# Patient Record
Sex: Female | Born: 1935 | Race: White | Hispanic: No | Marital: Married | Smoking: Never smoker
Health system: Southern US, Community
[De-identification: ages and names within clinical notes are randomized; demographics above are authoritative.]

## PROBLEM LIST (undated history)

## (undated) DIAGNOSIS — E119 Type 2 diabetes mellitus without complications: Secondary | ICD-10-CM

## (undated) DIAGNOSIS — I499 Cardiac arrhythmia, unspecified: Secondary | ICD-10-CM

## (undated) DIAGNOSIS — I700 Atherosclerosis of aorta: Secondary | ICD-10-CM

## (undated) DIAGNOSIS — E785 Hyperlipidemia, unspecified: Secondary | ICD-10-CM

## (undated) DIAGNOSIS — C801 Malignant (primary) neoplasm, unspecified: Secondary | ICD-10-CM

## (undated) DIAGNOSIS — M1990 Unspecified osteoarthritis, unspecified site: Secondary | ICD-10-CM

## (undated) DIAGNOSIS — C50919 Malignant neoplasm of unspecified site of unspecified female breast: Secondary | ICD-10-CM

## (undated) DIAGNOSIS — G629 Polyneuropathy, unspecified: Secondary | ICD-10-CM

## (undated) DIAGNOSIS — E039 Hypothyroidism, unspecified: Secondary | ICD-10-CM

## (undated) HISTORY — PX: BREAST SURGERY: SHX581

## (undated) HISTORY — PX: ABLATION: SHX5711

---

## 2006-12-16 DIAGNOSIS — C50919 Malignant neoplasm of unspecified site of unspecified female breast: Secondary | ICD-10-CM

## 2006-12-16 HISTORY — DX: Malignant neoplasm of unspecified site of unspecified female breast: C50.919

## 2006-12-16 HISTORY — PX: MASTECTOMY: SHX3

## 2013-01-14 ENCOUNTER — Ambulatory Visit: Payer: Self-pay | Admitting: Internal Medicine

## 2013-05-01 ENCOUNTER — Ambulatory Visit: Payer: Self-pay | Admitting: Orthopedic Surgery

## 2013-05-01 ENCOUNTER — Emergency Department: Payer: Self-pay | Admitting: Emergency Medicine

## 2013-05-01 LAB — PROTIME-INR
INR: 2.9
Prothrombin Time: 29.4 secs — ABNORMAL HIGH (ref 11.5–14.7)

## 2013-05-01 LAB — CBC
HCT: 38 % (ref 35.0–47.0)
HGB: 13 g/dL (ref 12.0–16.0)
MCH: 30.4 pg (ref 26.0–34.0)
MCHC: 34.1 g/dL (ref 32.0–36.0)
RBC: 4.26 10*6/uL (ref 3.80–5.20)
WBC: 5.5 10*3/uL (ref 3.6–11.0)

## 2013-05-01 LAB — BASIC METABOLIC PANEL
BUN: 17 mg/dL (ref 7–18)
Calcium, Total: 9.2 mg/dL (ref 8.5–10.1)
Chloride: 106 mmol/L (ref 98–107)
Co2: 27 mmol/L (ref 21–32)
Creatinine: 1.05 mg/dL (ref 0.60–1.30)
EGFR (African American): >60
EGFR (Non-African Amer.): 52 — ABNORMAL LOW
Glucose: 105 mg/dL — ABNORMAL HIGH (ref 65–99)
Osmolality: 281 (ref 275–301)
Sodium: 140 mmol/L (ref 136–145)

## 2013-05-01 LAB — APTT: Activated PTT: 36.3 secs — ABNORMAL HIGH (ref 23.6–35.9)

## 2013-05-03 ENCOUNTER — Ambulatory Visit: Payer: Self-pay | Admitting: Orthopedic Surgery

## 2013-05-05 ENCOUNTER — Ambulatory Visit: Payer: Self-pay | Admitting: Orthopedic Surgery

## 2013-05-05 LAB — APTT: Activated PTT: 26.9 secs (ref 23.6–35.9)

## 2013-10-18 ENCOUNTER — Ambulatory Visit: Payer: Self-pay | Admitting: Gastroenterology

## 2013-10-19 LAB — PATHOLOGY REPORT

## 2013-12-30 ENCOUNTER — Ambulatory Visit: Payer: Self-pay | Admitting: Orthopedic Surgery

## 2014-01-04 ENCOUNTER — Ambulatory Visit: Payer: Self-pay | Admitting: Gastroenterology

## 2014-01-06 LAB — PATHOLOGY REPORT

## 2014-01-11 ENCOUNTER — Ambulatory Visit: Payer: Self-pay | Admitting: Orthopedic Surgery

## 2014-01-19 ENCOUNTER — Ambulatory Visit: Payer: Self-pay | Admitting: Internal Medicine

## 2014-08-15 ENCOUNTER — Emergency Department: Payer: Self-pay | Admitting: Emergency Medicine

## 2014-08-16 LAB — CBC
HCT: 40.3 % (ref 35.0–47.0)
HGB: 13.5 g/dL (ref 12.0–16.0)
MCH: 30.3 pg (ref 26.0–34.0)
MCHC: 33.5 g/dL (ref 32.0–36.0)
MCV: 90 fL (ref 80–100)
Platelet: 133 10*3/uL — ABNORMAL LOW (ref 150–440)
RBC: 4.45 10*6/uL (ref 3.80–5.20)
RDW: 13.7 % (ref 11.5–14.5)
WBC: 4.5 10*3/uL (ref 3.6–11.0)

## 2014-08-16 LAB — URINALYSIS, COMPLETE
Bilirubin,UR: NEGATIVE
Blood: NEGATIVE
Glucose,UR: >=500 mg/dL (ref 0–75)
Ketone: NEGATIVE
LEUKOCYTE ESTERASE: NEGATIVE
Nitrite: NEGATIVE
PH: 7 (ref 4.5–8.0)
Protein: NEGATIVE
RBC,UR: 1 /HPF (ref 0–5)
SQUAMOUS EPITHELIAL: NONE SEEN
Specific Gravity: 1.002 (ref 1.003–1.030)

## 2014-08-16 LAB — COMPREHENSIVE METABOLIC PANEL
ALBUMIN: 3.4 g/dL (ref 3.4–5.0)
Alkaline Phosphatase: 61 U/L
Anion Gap: 7 (ref 7–16)
BUN: 18 mg/dL (ref 7–18)
Bilirubin,Total: 0.3 mg/dL (ref 0.2–1.0)
CHLORIDE: 107 mmol/L (ref 98–107)
CO2: 27 mmol/L (ref 21–32)
Calcium, Total: 8.8 mg/dL (ref 8.5–10.1)
Creatinine: 1.04 mg/dL (ref 0.60–1.30)
EGFR (Non-African Amer.): 52 — ABNORMAL LOW
Glucose: 255 mg/dL — ABNORMAL HIGH (ref 65–99)
Osmolality: 292 (ref 275–301)
Potassium: 3.6 mmol/L (ref 3.5–5.1)
SGOT(AST): 34 U/L (ref 15–37)
SGPT (ALT): 29 U/L
SODIUM: 141 mmol/L (ref 136–145)
TOTAL PROTEIN: 6.6 g/dL (ref 6.4–8.2)

## 2014-08-16 LAB — PROTIME-INR
INR: 2.3
Prothrombin Time: 24.5 secs — ABNORMAL HIGH (ref 11.5–14.7)

## 2014-08-16 LAB — TROPONIN I: TROPONIN-I: 0.02 ng/mL

## 2014-08-16 LAB — DIGOXIN LEVEL: Digoxin: <0.1 ng/mL — ABNORMAL LOW

## 2014-08-16 LAB — CK TOTAL AND CKMB (NOT AT ARMC)
CK, Total: 120 U/L
CK-MB: 1.5 ng/mL (ref 0.5–3.6)

## 2015-01-20 ENCOUNTER — Ambulatory Visit: Payer: Self-pay | Admitting: Internal Medicine

## 2015-04-07 NOTE — Op Note (Signed)
PATIENT NAME:  Stacy [name], Stacy [name] MR#:  [id number] DATE OF BIRTH:  1936/11/30  DATE OF PROCEDURE:  05/05/2013  PREOPERATIVE DIAGNOSIS: Left bimalleolar ankle fracture, displaced.   POSTOPRATIVE DIAGNOSIS:  Left bimalleolar ankle fracture, displaced.   PROCEDURE: Open reduction, internal fixation of medial and lateral malleoli, left ankle.   ANESTHESIA: Spinal.   SURGEON: Laurene Footman, MD   DESCRIPTION OF PROCEDURE: The patient was brought to the operating room and after adequate anesthesia was obtained,  the left thigh had a tourniquet applied and a bump was placed underneath the left buttock to internally rotate the leg. On removal of the splint, there was extensive fracture blisters surrounding the entire ankle. No evidence of infection. The ankle was then prepped and draped in the usual sterile fashion and timeout procedure completed. The tourniquet was raised to 300 mmHg. A lateral incision was made, incision down through the skin and subcutaneous tissue. The subcutaneous tissue was spread and the fracture site exposed. There was quite a bit comminution of the anterior piece very distally. After the fracture site was exposed, there was a large amount of blood clot present. This was irrigated out and after removal of this tissue with the pointed reduction clamp, the main fragments were anatomically aligned. A 7-hole composite plate from [employer] was then applied to the distal fibula after contouring it. A proximal nonlocking screw was placed to pull the plate to the shaft and the distal locking screws were inserted followed by additional more proximal locking and nonlocking screws. With three screws on either side of the fracture, the fracture fixation appeared very stable on AP and lateral imaging. The most proximal screw hole could not be used secondary to the slightly anterior position of the plate, but it did appear that there was excellent position of the distal fibula. Next, the medial incision  was made anteromedially and the wound thoroughly irrigated. The ribbed fracture was easily reduced because of the prior reduction laterally. A K wire was inserted followed by drilling and placing of a 36 mm 4.0 cannulated screw, which gave good compression. The guidewire was removed and on examination, the ankle was stable to stress under fluoroscopic evaluation and lateral x-ray also showed acceptable position of the hardware. The wounds were thoroughly irrigated and closed with 3-0 Vicryl subcutaneously and 5 skin staples.  Because of the patient's need for anticoagulation, a Prevena wound VAC was applied laterally, Adaptic  over the medial wound blisters and a well-padded stirrup splint, followed by Ace wrap. The tourniquet was let down.   TOURNIQUET TIME: 46 minutes.   COMPLICATIONS: None.   SPECIMEN: None.   ESTIMATED BLOOD LOSS: Minimal.    ____________________________ Laurene Footman, MD mjm:cc D: 05/05/2013 21:35:34 ET T: 05/05/2013 23:45:40 ET JOB#: [id number]  cc: Laurene Footman, MD, <Dictator> Laurene Footman MD ELECTRONICALLY SIGNED 05/06/2013 7:40

## 2015-04-07 NOTE — Consult Note (Signed)
PATIENT NAME:  Stacy [name], Stacy [name] MR#:  [id number] DATE OF BIRTH:  August 04, 1936  DATE OF CONSULTATION:  05/01/2013  REFERRING PHYSICIAN:   CONSULTING PHYSICIAN:  Claud Kelp, MD  HISTORY OF PRESENT ILLNESS:  Stacy [name] is a pleasant 79 year old female who missed a step on her porch today falling and sustaining a left bimalleolar ankle fracture.  Of note, her past medical history is significant for atrial fibrillation, which she is on Coumadin.  She had her appointment on Wednesday and was found to be supratherapeutic with an INR of 3.6.   PHYSICAL EXAMINATION:  She has diffuse swelling about her left ankle with no visible skin wrinkles medial or laterally.  Her DP and PT pulses are not palpable, but she does have Dopplerable pulses per the ER and she has brisk capillary refill to her digits.  She has no tenderness about the proximal tibia or fibula, the knee, the femur or the hip on the left lower extremity.   IMAGING STUDIES:  Radiographs taken demonstrate a bimalleolar ankle fracture.   ASSESSMENT:  Bimalleolar ankle fracture.   PLAN:  Given the patient's significant soft tissue swelling along with her supratherapeutic INR, she will likely not be a candidate for surgery for several days while her INR normalizes as she is bridged on Lovenox and as her swelling subsides.  Given her very supportive family structure here in town, we will splint her, send her home with instructions for strict elevation and bridge her on Lovenox.  We will follow up next week to recheck her INR.  Once INR is 1.5 or lower and her soft tissue swelling has subsided,  it will be safe to proceed with open reduction internal fixation at that time.    ____________________________ Claud Kelp, MD tte:ea D: 05/01/2013 15:11:00 ET T: 05/01/2013 23:23:07 ET JOB#: [id number]  cc: Claud Kelp, MD, <Dictator> Claud Kelp MD ELECTRONICALLY SIGNED 05/03/2013 20:58

## 2015-04-08 NOTE — Op Note (Signed)
PATIENT NAME:  Stacy [name], Stacy [name] MR#:  [id number] DATE OF BIRTH:  [birth year]  DATE OF PROCEDURE:  01/11/2014  PREOPERATIVE DIAGNOSIS: Painful hardware, left ankle.   POSTOPERATIVE DIAGNOSIS:  Painful hardware, left ankle.   PROCEDURE: Removal of deep hardware, left ankle medial and lateral.   ANESTHESIA: General.   SURGEON: Hessie Knows, M.D.   DESCRIPTION OF PROCEDURE: The patient was brought to the operating room and after adequate anesthesia was obtained, the left leg was prepped and draped in the usual sterile fashion.  A tourniquet was applied to the upper thigh with a bump underneath the left buttock. After prepping and draping in the usual sterile fashion, appropriate patient identification and timeout procedures were completed, the tourniquet was raised to 300 mmHg. Prior incision was  opened on the medial side first, with the mini C-arm being used to aid in localization of the head of the screw. Once the head of the screw was identified, the screw was removed without difficulty. Going laterally, the prior incision was opened. The plate was identified and soft tissue removed over it exposing all screw heads. All screws were then removed as well as the plate. There was some granulation tissue around the edges of the plate, and this was debrided to prevent prominence from scar tissue. The wounds were then irrigated, closed with 3-0 Vicryl subcutaneously and skin staples. Xeroform, 4 x 4's, Webril and Ace wrap were applied. The patient was sent to the recovery room in stable condition.   ESTIMATED BLOOD LOSS: Minimal.   COMPLICATIONS: None.   SPECIMENS: None.   HARDWARE: removed hardware was sent with patient.    TOURNIQUET TIME: 18 minutes at 300 mmHg.    ____________________________ Laurene Footman, MD mjm:dp D: 01/11/2014 09:54:44 ET T: 01/11/2014 10:18:44 ET JOB#: [id number]  cc: Laurene Footman, MD, <Dictator> Laurene Footman MD ELECTRONICALLY SIGNED 01/11/2014 11:03

## 2015-06-21 DIAGNOSIS — Z794 Long term (current) use of insulin: Secondary | ICD-10-CM | POA: Diagnosis not present

## 2015-06-21 DIAGNOSIS — E119 Type 2 diabetes mellitus without complications: Secondary | ICD-10-CM | POA: Diagnosis not present

## 2015-06-21 DIAGNOSIS — Z7951 Long term (current) use of inhaled steroids: Secondary | ICD-10-CM | POA: Diagnosis not present

## 2015-06-21 DIAGNOSIS — E039 Hypothyroidism, unspecified: Secondary | ICD-10-CM | POA: Diagnosis not present

## 2015-06-21 DIAGNOSIS — Z888 Allergy status to other drugs, medicaments and biological substances status: Secondary | ICD-10-CM | POA: Diagnosis not present

## 2015-06-21 DIAGNOSIS — Z79899 Other long term (current) drug therapy: Secondary | ICD-10-CM | POA: Diagnosis not present

## 2015-06-21 DIAGNOSIS — Z8249 Family history of ischemic heart disease and other diseases of the circulatory system: Secondary | ICD-10-CM | POA: Diagnosis not present

## 2015-06-21 DIAGNOSIS — I480 Paroxysmal atrial fibrillation: Secondary | ICD-10-CM | POA: Diagnosis present

## 2015-06-21 DIAGNOSIS — Z8261 Family history of arthritis: Secondary | ICD-10-CM | POA: Diagnosis not present

## 2015-06-21 DIAGNOSIS — Z811 Family history of alcohol abuse and dependence: Secondary | ICD-10-CM | POA: Diagnosis not present

## 2015-06-21 DIAGNOSIS — I471 Supraventricular tachycardia: Secondary | ICD-10-CM | POA: Diagnosis not present

## 2015-06-21 DIAGNOSIS — Z833 Family history of diabetes mellitus: Secondary | ICD-10-CM | POA: Diagnosis not present

## 2015-06-21 DIAGNOSIS — Z885 Allergy status to narcotic agent status: Secondary | ICD-10-CM | POA: Diagnosis not present

## 2015-06-21 DIAGNOSIS — I4892 Unspecified atrial flutter: Secondary | ICD-10-CM | POA: Diagnosis not present

## 2015-06-21 DIAGNOSIS — Z836 Family history of other diseases of the respiratory system: Secondary | ICD-10-CM | POA: Diagnosis not present

## 2015-06-21 DIAGNOSIS — Z853 Personal history of malignant neoplasm of breast: Secondary | ICD-10-CM | POA: Diagnosis not present

## 2015-06-21 DIAGNOSIS — Z882 Allergy status to sulfonamides status: Secondary | ICD-10-CM | POA: Diagnosis not present

## 2015-06-21 DIAGNOSIS — E785 Hyperlipidemia, unspecified: Secondary | ICD-10-CM | POA: Diagnosis not present

## 2015-06-21 DIAGNOSIS — Z7901 Long term (current) use of anticoagulants: Secondary | ICD-10-CM | POA: Diagnosis not present

## 2015-06-21 DIAGNOSIS — Z91041 Radiographic dye allergy status: Secondary | ICD-10-CM | POA: Diagnosis not present

## 2015-06-22 ENCOUNTER — Ambulatory Visit: Payer: Medicare Other | Admitting: Certified Registered Nurse Anesthetist

## 2015-06-22 ENCOUNTER — Encounter
Admission: RE | Disposition: A | Discharge status: Home / Self Care | Payer: Self-pay | Source: Ambulatory Visit | Attending: Internal Medicine

## 2015-06-22 ENCOUNTER — Encounter: Payer: Self-pay | Admitting: *Deleted

## 2015-06-22 ENCOUNTER — Ambulatory Visit
Admission: RE | Admit: 2015-06-22 | Discharge: 2015-06-22 | Disposition: A | Discharge status: Home / Self Care | Payer: Medicare Other | Source: Ambulatory Visit | Attending: Internal Medicine | Admitting: Internal Medicine

## 2015-06-22 DIAGNOSIS — Z7951 Long term (current) use of inhaled steroids: Secondary | ICD-10-CM | POA: Insufficient documentation

## 2015-06-22 DIAGNOSIS — Z885 Allergy status to narcotic agent status: Secondary | ICD-10-CM | POA: Insufficient documentation

## 2015-06-22 DIAGNOSIS — Z836 Family history of other diseases of the respiratory system: Secondary | ICD-10-CM | POA: Insufficient documentation

## 2015-06-22 DIAGNOSIS — I4892 Unspecified atrial flutter: Secondary | ICD-10-CM | POA: Insufficient documentation

## 2015-06-22 DIAGNOSIS — Z8249 Family history of ischemic heart disease and other diseases of the circulatory system: Secondary | ICD-10-CM | POA: Insufficient documentation

## 2015-06-22 DIAGNOSIS — Z7901 Long term (current) use of anticoagulants: Secondary | ICD-10-CM | POA: Insufficient documentation

## 2015-06-22 DIAGNOSIS — E039 Hypothyroidism, unspecified: Secondary | ICD-10-CM | POA: Insufficient documentation

## 2015-06-22 DIAGNOSIS — Z853 Personal history of malignant neoplasm of breast: Secondary | ICD-10-CM | POA: Insufficient documentation

## 2015-06-22 DIAGNOSIS — Z91041 Radiographic dye allergy status: Secondary | ICD-10-CM | POA: Insufficient documentation

## 2015-06-22 DIAGNOSIS — Z888 Allergy status to other drugs, medicaments and biological substances status: Secondary | ICD-10-CM | POA: Insufficient documentation

## 2015-06-22 DIAGNOSIS — E119 Type 2 diabetes mellitus without complications: Secondary | ICD-10-CM | POA: Insufficient documentation

## 2015-06-22 DIAGNOSIS — E785 Hyperlipidemia, unspecified: Secondary | ICD-10-CM | POA: Insufficient documentation

## 2015-06-22 DIAGNOSIS — Z8261 Family history of arthritis: Secondary | ICD-10-CM | POA: Insufficient documentation

## 2015-06-22 DIAGNOSIS — Z882 Allergy status to sulfonamides status: Secondary | ICD-10-CM | POA: Insufficient documentation

## 2015-06-22 DIAGNOSIS — Z811 Family history of alcohol abuse and dependence: Secondary | ICD-10-CM | POA: Insufficient documentation

## 2015-06-22 DIAGNOSIS — Z833 Family history of diabetes mellitus: Secondary | ICD-10-CM | POA: Insufficient documentation

## 2015-06-22 DIAGNOSIS — Z794 Long term (current) use of insulin: Secondary | ICD-10-CM | POA: Insufficient documentation

## 2015-06-22 DIAGNOSIS — I471 Supraventricular tachycardia: Secondary | ICD-10-CM | POA: Insufficient documentation

## 2015-06-22 DIAGNOSIS — Z79899 Other long term (current) drug therapy: Secondary | ICD-10-CM | POA: Insufficient documentation

## 2015-06-22 HISTORY — DX: Type 2 diabetes mellitus without complications: E11.9

## 2015-06-22 HISTORY — DX: Unspecified osteoarthritis, unspecified site: M19.90

## 2015-06-22 HISTORY — PX: ELECTROPHYSIOLOGIC STUDY: SHX172A

## 2015-06-22 HISTORY — DX: Cardiac arrhythmia, unspecified: I49.9

## 2015-06-22 HISTORY — DX: Malignant (primary) neoplasm, unspecified: C80.1

## 2015-06-22 LAB — PROTIME-INR
INR: 1.97
Prothrombin Time: 22.6 seconds — ABNORMAL HIGH (ref 11.4–15.0)

## 2015-06-22 SURGERY — CARDIOVERSION (CATH LAB)
Anesthesia: General

## 2015-06-22 MED ORDER — PROPOFOL 10 MG/ML IV BOLUS
INTRAVENOUS | Status: DC | PRN
  Administered 2015-06-22: 70 mg via INTRAVENOUS

## 2015-06-22 MED ORDER — SODIUM CHLORIDE 0.9 % IV SOLN
INTRAVENOUS | Status: DC
  Administered 2015-06-22: 10:00:00 via INTRAVENOUS

## 2015-06-22 MED ORDER — LIDOCAINE HCL (CARDIAC) 20 MG/ML IV SOLN
INTRAVENOUS | Status: DC | PRN
  Administered 2015-06-22: 60 mg via INTRAVENOUS

## 2015-06-22 NOTE — Anesthesia Postprocedure Evaluation (Signed)
  Anesthesia Post-op Note  Patient: Stacy [name]  Procedure(s) Performed: Procedure(s): Cardioversion (N/A)  Anesthesia type:General  Patient location: PACU  Post pain: Pain level controlled  Post assessment: Post-op Vital signs reviewed, Patient's Cardiovascular Status Stable, Respiratory Function Stable, Patent Airway and No signs of Nausea or vomiting  Post vital signs: Reviewed and stable  Last Vitals:  Filed Vitals:   06/22/15 1054  BP: 107/62  Pulse: 67  Temp:   Resp: 13    Level of consciousness: awake, alert  and patient cooperative  Complications: No apparent anesthesia complications

## 2015-06-22 NOTE — Transfer of Care (Signed)
Immediate Anesthesia Transfer of Care Note  Patient: Stacy [name]  Procedure(s) Performed: Procedure(s): Cardioversion (N/A)  Patient Location: PACU  Anesthesia Type:General  Level of Consciousness: awake, alert  and oriented  Airway & Oxygen Therapy: Patient Spontanous Breathing and Patient connected to nasal cannula oxygen  Post-op Assessment: Report given to RN and Post -op Vital signs reviewed and stable  Post vital signs: Reviewed and stable  Last Vitals:  Filed Vitals:   06/22/15 1027  BP: 107/62  Pulse: 69  Temp: 97.9  Resp: 11    Complications: No apparent anesthesia complications

## 2015-06-22 NOTE — Anesthesia Preprocedure Evaluation (Addendum)
Anesthesia Evaluation  Patient identified by MRN, date of birth, ID band Patient awake    Reviewed: Allergy & Precautions, H&P , NPO status , Patient's Chart, lab work & pertinent test results, reviewed documented beta blocker date and time   History of Anesthesia Complications (+) Emergence Delirium and history of anesthetic complications  Airway Mallampati: II  TM Distance: >3 FB Neck ROM: full    Dental no notable dental hx. (+) Teeth Intact   Pulmonary neg pulmonary ROS,  breath sounds clear to auscultation  Pulmonary exam normal       Cardiovascular Exercise Tolerance: Good + dysrhythmias Atrial Fibrillation Rhythm:irregular Rate:Abnormal     Neuro/Psych negative neurological ROS  negative psych ROS   GI/Hepatic negative GI ROS, Neg liver ROS,   Endo/Other  negative endocrine ROSdiabetes, Well Controlled, Type 2  Renal/GU negative Renal ROS  negative genitourinary   Musculoskeletal   Abdominal   Peds  Hematology negative hematology ROS (+)   Anesthesia Other Findings snores  Reproductive/Obstetrics negative OB ROS                          Anesthesia Physical Anesthesia Plan  ASA: III  Anesthesia Plan: General   Post-op Pain Management:    Induction:   Airway Management Planned:   Additional Equipment:   Intra-op Plan:   Post-operative Plan:   Informed Consent: I have reviewed the patients History and Physical, chart, labs and discussed the procedure including the risks, benefits and alternatives for the proposed anesthesia with the patient or authorized representative who has indicated his/her understanding and acceptance.   Dental Advisory Given  Plan Discussed with: Anesthesiologist, CRNA and Surgeon  Anesthesia Plan Comments:        Anesthesia Quick Evaluation

## 2015-08-18 NOTE — CV Procedure (Signed)
Elective cardioversion  indication atrial flutter  primary physician Emily Filbert  EP physician Dr. Mylinda Latina  date July 14, 2015   vital signs  Blood pressure 120/80  heart rate 100 regular  respiratory rate 16  patient was brought to special is recovery area anesthesia helped with sedation patient had external  biphasic pads applied to her chest and back.  Initial EKG showed atrial flutter at about 100 and patient had adequate sedation by anesthesia that was cardioverted with 200 joules synchronized and converted to sinus rhythm of around 80 and regular  patient tolerated the procedure well postop recovery was handled by anesthesia as well as nurse's is vessel occur every patient was discharged home without incident  conclusion  successful electrical cardioversion from atrial flutter back to sinus rhythm  outpatient follow-up with EP continue anticoagulation and rate control

## 2015-08-25 ENCOUNTER — Encounter: Payer: Self-pay | Admitting: Internal Medicine

## 2016-03-05 ENCOUNTER — Other Ambulatory Visit: Payer: Self-pay | Admitting: Internal Medicine

## 2016-03-05 DIAGNOSIS — Z1231 Encounter for screening mammogram for malignant neoplasm of breast: Secondary | ICD-10-CM

## 2016-03-13 ENCOUNTER — Ambulatory Visit
Admission: RE | Admit: 2016-03-13 | Discharge: 2016-03-13 | Disposition: A | Discharge status: Home / Self Care | Payer: Medicare Other | Source: Ambulatory Visit | Attending: Internal Medicine | Admitting: Internal Medicine

## 2016-03-13 ENCOUNTER — Other Ambulatory Visit: Payer: Self-pay | Admitting: Internal Medicine

## 2016-03-13 DIAGNOSIS — Z1231 Encounter for screening mammogram for malignant neoplasm of breast: Secondary | ICD-10-CM | POA: Diagnosis not present

## 2016-03-13 DIAGNOSIS — R921 Mammographic calcification found on diagnostic imaging of breast: Secondary | ICD-10-CM | POA: Diagnosis not present

## 2016-03-13 HISTORY — DX: Malignant neoplasm of unspecified site of unspecified female breast: C50.919

## 2016-03-18 ENCOUNTER — Other Ambulatory Visit: Payer: Self-pay | Admitting: Internal Medicine

## 2016-03-18 DIAGNOSIS — R928 Other abnormal and inconclusive findings on diagnostic imaging of breast: Secondary | ICD-10-CM

## 2016-03-22 ENCOUNTER — Ambulatory Visit
Admission: RE | Admit: 2016-03-22 | Discharge: 2016-03-22 | Disposition: A | Discharge status: Home / Self Care | Payer: Medicare Other | Source: Ambulatory Visit | Attending: Internal Medicine | Admitting: Internal Medicine

## 2016-03-22 DIAGNOSIS — R921 Mammographic calcification found on diagnostic imaging of breast: Secondary | ICD-10-CM | POA: Diagnosis not present

## 2016-03-22 DIAGNOSIS — R928 Other abnormal and inconclusive findings on diagnostic imaging of breast: Secondary | ICD-10-CM

## 2016-03-26 ENCOUNTER — Other Ambulatory Visit: Payer: Self-pay | Admitting: Internal Medicine

## 2016-03-26 DIAGNOSIS — R921 Mammographic calcification found on diagnostic imaging of breast: Secondary | ICD-10-CM

## 2016-03-26 DIAGNOSIS — N6459 Other signs and symptoms in breast: Secondary | ICD-10-CM

## 2016-04-02 ENCOUNTER — Ambulatory Visit: Payer: Medicare Other

## 2016-04-09 ENCOUNTER — Ambulatory Visit: Payer: Medicare Other | Admitting: Certified Registered"

## 2016-04-09 ENCOUNTER — Ambulatory Visit
Admission: RE | Admit: 2016-04-09 | Discharge: 2016-04-09 | Disposition: A | Discharge status: Home / Self Care | Payer: Medicare Other | Source: Ambulatory Visit | Attending: Internal Medicine | Admitting: Internal Medicine

## 2016-04-09 ENCOUNTER — Encounter: Payer: Self-pay | Admitting: Anesthesiology

## 2016-04-09 ENCOUNTER — Encounter
Admission: RE | Disposition: A | Discharge status: Home / Self Care | Payer: Self-pay | Source: Ambulatory Visit | Attending: Internal Medicine

## 2016-04-09 DIAGNOSIS — E119 Type 2 diabetes mellitus without complications: Secondary | ICD-10-CM | POA: Insufficient documentation

## 2016-04-09 DIAGNOSIS — Z794 Long term (current) use of insulin: Secondary | ICD-10-CM | POA: Diagnosis not present

## 2016-04-09 DIAGNOSIS — Z7984 Long term (current) use of oral hypoglycemic drugs: Secondary | ICD-10-CM | POA: Diagnosis not present

## 2016-04-09 DIAGNOSIS — I482 Chronic atrial fibrillation: Secondary | ICD-10-CM | POA: Insufficient documentation

## 2016-04-09 DIAGNOSIS — M1990 Unspecified osteoarthritis, unspecified site: Secondary | ICD-10-CM | POA: Insufficient documentation

## 2016-04-09 DIAGNOSIS — E039 Hypothyroidism, unspecified: Secondary | ICD-10-CM | POA: Diagnosis not present

## 2016-04-09 DIAGNOSIS — I4892 Unspecified atrial flutter: Secondary | ICD-10-CM | POA: Insufficient documentation

## 2016-04-09 DIAGNOSIS — Z853 Personal history of malignant neoplasm of breast: Secondary | ICD-10-CM | POA: Insufficient documentation

## 2016-04-09 DIAGNOSIS — I10 Essential (primary) hypertension: Secondary | ICD-10-CM | POA: Insufficient documentation

## 2016-04-09 HISTORY — PX: ELECTROPHYSIOLOGIC STUDY: SHX172A

## 2016-04-09 SURGERY — CARDIOVERSION (CATH LAB)
Anesthesia: General

## 2016-04-09 MED ORDER — PROPOFOL 10 MG/ML IV BOLUS
INTRAVENOUS | Status: DC | PRN
  Administered 2016-04-09: 20 mg via INTRAVENOUS
  Administered 2016-04-09 (×2): 40 mg via INTRAVENOUS

## 2016-04-09 MED ORDER — EPHEDRINE SULFATE 50 MG/ML IJ SOLN
INTRAMUSCULAR | Status: DC | PRN
  Administered 2016-04-09 (×2): 5 mg via INTRAVENOUS

## 2016-04-09 MED ORDER — SODIUM CHLORIDE 0.9 % IV SOLN
INTRAVENOUS | Status: DC
  Administered 2016-04-09 (×2): via INTRAVENOUS

## 2016-04-09 NOTE — Transfer of Care (Signed)
Immediate Anesthesia Transfer of Care Note  Patient: Stacy [name]  Procedure(s) Performed: Procedure(s): Cardioversion (N/A)  Patient Location: PACU and Cath Lab  Anesthesia Type:MAC  Level of Consciousness: responds to stimulation, sleeping  Airway & Oxygen Therapy: Patient Spontanous Breathing and Patient connected to nasal cannula oxygen  Post-op Assessment: Report given to RN and Post -op Vital signs reviewed and stable  Post vital signs: Reviewed and stable  Last Vitals:  Filed Vitals:   04/09/16 0801 04/09/16 0802  BP: 92/62 92/62  Pulse: 49 49  Temp:    Resp: 13 18    Complications: No apparent anesthesia complications

## 2016-04-09 NOTE — Discharge Instructions (Signed)
Electrical Cardioversion, Care After °Refer to this sheet in the next few weeks. These instructions provide you with information on caring for yourself after your procedure. Your health care provider may also give you more specific instructions. Your treatment has been planned according to current medical practices, but problems sometimes occur. Call your health care provider if you have any problems or questions after your procedure. °WHAT TO EXPECT AFTER THE PROCEDURE °After your procedure, it is typical to have the following sensations: °· Some redness on the skin where the shocks were delivered. If this is tender, a sunburn lotion or hydrocortisone cream may help. °· Possible return of an abnormal heart rhythm within hours or days after the procedure. °HOME CARE INSTRUCTIONS °· Take medicines only as directed by your health care provider. Be sure you understand how and when to take your medicine. °· Learn how to feel your pulse and check it often. °· Limit your activity for 48 hours after the procedure or as directed by your health care provider. °· Avoid or minimize caffeine and other stimulants as directed by your health care provider. °SEEK MEDICAL CARE IF: °· You feel like your heart is beating too fast or your pulse is not regular. °· You have any questions about your medicines. °· You have bleeding that will not stop. °SEEK IMMEDIATE MEDICAL CARE IF: °· You are dizzy or feel faint. °· It is hard to breathe or you feel short of breath. °· There is a change in discomfort in your chest. °· Your speech is slurred or you have trouble moving an arm or leg on one side of your body. °· You get a serious muscle cramp that does not go away. °· Your fingers or toes turn cold or blue. °  °This information is not intended to replace advice given to you by your health care provider. Make sure you discuss any questions you have with your health care provider. °  °Document Released: 09/22/2013 Document Revised: 12/23/2014  Document Reviewed: 09/22/2013 °Elsevier Interactive Patient Education ©2016 [employer] ° °

## 2016-04-09 NOTE — Anesthesia Postprocedure Evaluation (Signed)
Anesthesia Post Note  Patient: Stacy [name]  Procedure(s) Performed: Procedure(s) (LRB): Cardioversion (N/A)  Patient location during evaluation: Cath Lab Anesthesia Type: General Level of consciousness: awake and alert Pain management: pain level controlled Vital Signs Assessment: post-procedure vital signs reviewed and stable Respiratory status: spontaneous breathing, nonlabored ventilation, respiratory function stable and patient connected to nasal cannula oxygen Cardiovascular status: blood pressure returned to baseline and stable Postop Assessment: no signs of nausea or vomiting Anesthetic complications: no    Last Vitals:  Filed Vitals:   04/09/16 0828 04/09/16 0830  BP: 98/57 97/79  Pulse: 53   Temp:    Resp: 16 10    Last Pain: There were no vitals filed for this visit.               Martha Clan

## 2016-04-09 NOTE — CV Procedure (Addendum)
  Cardioversion Note A standard informed consent was obtained. Timeout was performed. The pads were placed in the anterior posterior fashion. The patient was given propofol by the anesthesia team.  Successful cardioversion was performed with a 100 J. AFlutter The patient converted to sinus rhythm. Pre-and post EKGs were reviewed. The patient tolerated the procedure with no immediate complications. Post procedure EKG showed sinus brady at 50 with NSSTW changes  Recommendations: Continue same medications and follow-up in 2-3 weeks.

## 2016-04-09 NOTE — Anesthesia Preprocedure Evaluation (Signed)
Anesthesia Evaluation  Patient identified by MRN, date of birth, ID band Patient awake    Reviewed: Allergy & Precautions, H&P , NPO status , Patient's Chart, lab work & pertinent test results, reviewed documented beta blocker date and time   History of Anesthesia Complications (+) history of anesthetic complications (Hallucinations after surgery a long time ago, none since then)  Airway Mallampati: I  TM Distance: >3 FB Neck ROM: full    Dental no notable dental hx. (+) Caps, Teeth Intact   Pulmonary shortness of breath and with exertion, neg sleep apnea, neg COPD, neg recent URI,    Pulmonary exam normal breath sounds clear to auscultation       Cardiovascular Exercise Tolerance: Good (-) hypertension(-) angina(-) CAD, (-) Past MI, (-) Cardiac Stents and (-) CABG Normal cardiovascular exam+ dysrhythmias Atrial Fibrillation (-) Valvular Problems/Murmurs Rhythm:regular Rate:Normal     Neuro/Psych negative neurological ROS  negative psych ROS   GI/Hepatic negative GI ROS, Neg liver ROS,   Endo/Other  diabetes, Oral Hypoglycemic Agents, Insulin DependentHypothyroidism   Renal/GU negative Renal ROS  negative genitourinary   Musculoskeletal   Abdominal   Peds  Hematology negative hematology ROS (+)   Anesthesia Other Findings Past Medical History:   Dysrhythmia                                                  Diabetes mellitus without complication ([place])                 Arthritis                                                    Cancer ([place])                                                   Comment:breast   Breast cancer ([place])                             2008           Comment:mastectomy left   Reproductive/Obstetrics negative OB ROS                             Anesthesia Physical Anesthesia Plan  ASA: III  Anesthesia Plan: General   Post-op Pain Management:    Induction:   Airway  Management Planned:   Additional Equipment:   Intra-op Plan:   Post-operative Plan:   Informed Consent: I have reviewed the patients History and Physical, chart, labs and discussed the procedure including the risks, benefits and alternatives for the proposed anesthesia with the patient or authorized representative who has indicated his/her understanding and acceptance.   Dental Advisory Given  Plan Discussed with: Anesthesiologist, CRNA and Surgeon  Anesthesia Plan Comments:         Anesthesia Quick Evaluation

## 2016-04-16 ENCOUNTER — Ambulatory Visit: Payer: Medicare Other

## 2016-04-30 ENCOUNTER — Ambulatory Visit
Admission: RE | Admit: 2016-04-30 | Discharge: 2016-04-30 | Disposition: A | Discharge status: Home / Self Care | Payer: Medicare Other | Source: Ambulatory Visit | Attending: Internal Medicine | Admitting: Internal Medicine

## 2016-04-30 DIAGNOSIS — N6459 Other signs and symptoms in breast: Secondary | ICD-10-CM

## 2016-04-30 DIAGNOSIS — R921 Mammographic calcification found on diagnostic imaging of breast: Secondary | ICD-10-CM

## 2016-04-30 HISTORY — PX: BREAST BIOPSY: SHX20

## 2016-06-24 ENCOUNTER — Other Ambulatory Visit: Payer: Self-pay | Admitting: Student

## 2016-06-24 DIAGNOSIS — R1011 Right upper quadrant pain: Secondary | ICD-10-CM

## 2016-06-28 ENCOUNTER — Ambulatory Visit
Admission: RE | Admit: 2016-06-28 | Discharge: 2016-06-28 | Disposition: A | Discharge status: Home / Self Care | Payer: Medicare Other | Source: Ambulatory Visit | Attending: Student | Admitting: Student

## 2016-06-28 DIAGNOSIS — R1011 Right upper quadrant pain: Secondary | ICD-10-CM | POA: Insufficient documentation

## 2016-06-28 MED ORDER — TECHNETIUM TC 99M SULFUR COLLOID
2.1500 | Freq: Once | INTRAVENOUS | Status: AC | PRN
  Administered 2016-06-28: 2.15 via INTRAVENOUS

## 2016-07-09 LAB — SURGICAL PATHOLOGY

## 2016-07-23 ENCOUNTER — Other Ambulatory Visit: Payer: Self-pay | Admitting: Student

## 2016-07-23 DIAGNOSIS — R1084 Generalized abdominal pain: Secondary | ICD-10-CM

## 2016-07-26 ENCOUNTER — Ambulatory Visit
Admission: RE | Admit: 2016-07-26 | Discharge: 2016-07-26 | Disposition: A | Discharge status: Home / Self Care | Payer: Medicare Other | Source: Ambulatory Visit | Attending: Student | Admitting: Student

## 2016-07-26 ENCOUNTER — Encounter: Payer: Self-pay | Admitting: Radiology

## 2016-07-26 DIAGNOSIS — R1084 Generalized abdominal pain: Secondary | ICD-10-CM | POA: Diagnosis not present

## 2016-07-26 DIAGNOSIS — Z9889 Other specified postprocedural states: Secondary | ICD-10-CM | POA: Insufficient documentation

## 2016-07-26 MED ORDER — IOPAMIDOL (ISOVUE-300) INJECTION 61%
100.0000 mL | Freq: Once | INTRAVENOUS | Status: AC | PRN
  Administered 2016-07-26: 100 mL via INTRAVENOUS

## 2016-12-06 ENCOUNTER — Encounter: Payer: Self-pay | Admitting: *Deleted

## 2016-12-11 ENCOUNTER — Ambulatory Visit
Admission: RE | Admit: 2016-12-11 | Discharge: 2016-12-11 | Disposition: A | Discharge status: Home / Self Care | Payer: Medicare Other | Source: Ambulatory Visit | Attending: Unknown Physician Specialty | Admitting: Unknown Physician Specialty

## 2016-12-11 ENCOUNTER — Encounter
Admission: RE | Disposition: A | Discharge status: Home / Self Care | Payer: Self-pay | Source: Ambulatory Visit | Attending: Unknown Physician Specialty

## 2016-12-11 ENCOUNTER — Ambulatory Visit: Payer: Medicare Other | Admitting: Certified Registered Nurse Anesthetist

## 2016-12-11 ENCOUNTER — Encounter: Payer: Self-pay | Admitting: *Deleted

## 2016-12-11 DIAGNOSIS — R1084 Generalized abdominal pain: Secondary | ICD-10-CM | POA: Insufficient documentation

## 2016-12-11 DIAGNOSIS — Z7951 Long term (current) use of inhaled steroids: Secondary | ICD-10-CM | POA: Insufficient documentation

## 2016-12-11 DIAGNOSIS — Z9012 Acquired absence of left breast and nipple: Secondary | ICD-10-CM | POA: Insufficient documentation

## 2016-12-11 DIAGNOSIS — Z79899 Other long term (current) drug therapy: Secondary | ICD-10-CM | POA: Insufficient documentation

## 2016-12-11 DIAGNOSIS — E1140 Type 2 diabetes mellitus with diabetic neuropathy, unspecified: Secondary | ICD-10-CM | POA: Insufficient documentation

## 2016-12-11 DIAGNOSIS — Z853 Personal history of malignant neoplasm of breast: Secondary | ICD-10-CM | POA: Insufficient documentation

## 2016-12-11 DIAGNOSIS — Z7901 Long term (current) use of anticoagulants: Secondary | ICD-10-CM | POA: Insufficient documentation

## 2016-12-11 DIAGNOSIS — Z794 Long term (current) use of insulin: Secondary | ICD-10-CM | POA: Insufficient documentation

## 2016-12-11 DIAGNOSIS — E785 Hyperlipidemia, unspecified: Secondary | ICD-10-CM | POA: Insufficient documentation

## 2016-12-11 DIAGNOSIS — K2270 Barrett's esophagus without dysplasia: Secondary | ICD-10-CM | POA: Diagnosis not present

## 2016-12-11 DIAGNOSIS — E039 Hypothyroidism, unspecified: Secondary | ICD-10-CM | POA: Insufficient documentation

## 2016-12-11 HISTORY — DX: Polyneuropathy, unspecified: G62.9

## 2016-12-11 HISTORY — DX: Hypothyroidism, unspecified: E03.9

## 2016-12-11 HISTORY — PX: ESOPHAGOGASTRODUODENOSCOPY (EGD) WITH PROPOFOL: SHX5813

## 2016-12-11 HISTORY — DX: Hyperlipidemia, unspecified: E78.5

## 2016-12-11 HISTORY — DX: Atherosclerosis of aorta: I70.0

## 2016-12-11 LAB — GLUCOSE, CAPILLARY: GLUCOSE-CAPILLARY: 75 mg/dL (ref 65–99)

## 2016-12-11 SURGERY — ESOPHAGOGASTRODUODENOSCOPY (EGD) WITH PROPOFOL
Anesthesia: General

## 2016-12-11 MED ORDER — PROPOFOL 500 MG/50ML IV EMUL
INTRAVENOUS | Status: AC
  Filled 2016-12-11: qty 50

## 2016-12-11 MED ORDER — LIDOCAINE 2% (20 MG/ML) 5 ML SYRINGE
INTRAMUSCULAR | Status: AC
  Filled 2016-12-11: qty 5

## 2016-12-11 MED ORDER — LIDOCAINE HCL (CARDIAC) 20 MG/ML IV SOLN
INTRAVENOUS | Status: DC | PRN
  Administered 2016-12-11: 50 mg via INTRAVENOUS

## 2016-12-11 MED ORDER — MIDAZOLAM HCL 2 MG/2ML IJ SOLN
INTRAMUSCULAR | Status: DC | PRN
  Administered 2016-12-11: 1 mg via INTRAVENOUS

## 2016-12-11 MED ORDER — GLYCOPYRROLATE 0.2 MG/ML IJ SOLN
INTRAMUSCULAR | Status: DC | PRN
  Administered 2016-12-11: 0.2 mg via INTRAVENOUS

## 2016-12-11 MED ORDER — MIDAZOLAM HCL 2 MG/2ML IJ SOLN
INTRAMUSCULAR | Status: AC
  Filled 2016-12-11: qty 2

## 2016-12-11 MED ORDER — SODIUM CHLORIDE 0.9 % IV SOLN
INTRAVENOUS | Status: DC
  Administered 2016-12-11: 08:00:00 via INTRAVENOUS

## 2016-12-11 MED ORDER — GLYCOPYRROLATE 0.2 MG/ML IJ SOLN
INTRAMUSCULAR | Status: AC
  Filled 2016-12-11: qty 1

## 2016-12-11 MED ORDER — PROPOFOL 500 MG/50ML IV EMUL
INTRAVENOUS | Status: DC | PRN
  Administered 2016-12-11: 120 ug/kg/min via INTRAVENOUS

## 2016-12-11 MED ORDER — SODIUM CHLORIDE 0.9 % IV SOLN
INTRAVENOUS | Status: DC
  Administered 2016-12-11: 1000 mL via INTRAVENOUS

## 2016-12-11 MED ORDER — PROPOFOL 10 MG/ML IV BOLUS
INTRAVENOUS | Status: DC | PRN
  Administered 2016-12-11: 20 mg via INTRAVENOUS
  Administered 2016-12-11: 10 mg via INTRAVENOUS

## 2016-12-11 NOTE — Anesthesia Preprocedure Evaluation (Signed)
Anesthesia Evaluation  Patient identified by MRN, date of birth, ID band Patient awake    Reviewed: Allergy & Precautions, H&P , NPO status , Patient's Chart, lab work & pertinent test results, reviewed documented beta blocker date and time Preop documentation limited or incomplete due to emergent nature of procedure.  History of Anesthesia Complications (+) history of anesthetic complications (Hallucinations after surgery a long time ago, none since then)  Airway Mallampati: I  TM Distance: >3 FB Neck ROM: full    Dental no notable dental hx. (+) Caps, Teeth Intact   Pulmonary shortness of breath and with exertion, asthma , neg sleep apnea, neg COPD, neg recent URI,    Pulmonary exam normal breath sounds clear to auscultation       Cardiovascular Exercise Tolerance: Good (-) hypertension(-) angina+ Peripheral Vascular Disease  (-) CAD, (-) Past MI, (-) Cardiac Stents and (-) CABG Normal cardiovascular exam+ dysrhythmias Atrial Fibrillation (-) Valvular Problems/Murmurs Rhythm:regular Rate:Normal     Neuro/Psych negative neurological ROS  negative psych ROS   GI/Hepatic negative GI ROS, Neg liver ROS,   Endo/Other  diabetes, Oral Hypoglycemic Agents, Insulin DependentHypothyroidism   Renal/GU negative Renal ROS  negative genitourinary   Musculoskeletal   Abdominal   Peds  Hematology negative hematology ROS (+)   Anesthesia Other Findings Past Medical History:   Dysrhythmia                                                  Diabetes mellitus without complication ([place])                 Arthritis                                                    Cancer ([place])                                                   Comment:breast   Breast cancer ([place])                             2008           Comment:mastectomy left   Reproductive/Obstetrics negative OB ROS                             Anesthesia  Physical  Anesthesia Plan  ASA: III  Anesthesia Plan: General   Post-op Pain Management:    Induction:   Airway Management Planned:   Additional Equipment:   Intra-op Plan:   Post-operative Plan:   Informed Consent: I have reviewed the patients History and Physical, chart, labs and discussed the procedure including the risks, benefits and alternatives for the proposed anesthesia with the patient or authorized representative who has indicated his/her understanding and acceptance.   Dental Advisory Given  Plan Discussed with: Anesthesiologist, CRNA and Surgeon  Anesthesia Plan Comments:         Anesthesia Quick Evaluation

## 2016-12-11 NOTE — Anesthesia Procedure Notes (Signed)
Date/Time: 12/11/2016 7:50 AM Performed by: Johnna Acosta Pre-anesthesia Checklist: Patient identified, Emergency Drugs available, Suction available, Patient being monitored and Timeout performed Patient Re-evaluated:Patient Re-evaluated prior to induction

## 2016-12-11 NOTE — Transfer of Care (Signed)
Immediate Anesthesia Transfer of Care Note  Patient: Stacy [name]  Procedure(s) Performed: Procedure(s): ESOPHAGOGASTRODUODENOSCOPY (EGD) WITH PROPOFOL (N/A)  Patient Location: PACU  Anesthesia Type:General  Level of Consciousness: sedated  Airway & Oxygen Therapy: Patient Spontanous Breathing and Patient connected to nasal cannula oxygen  Post-op Assessment: Report given to RN and Post -op Vital signs reviewed and stable  Post vital signs: Reviewed and stable  Last Vitals:  Vitals:   12/11/16 0722 12/11/16 0802  BP: (!) 155/70 (!) 102/46  Pulse: 63 61  Resp: 16 14  Temp: (!) 36.1 C (!) 35.9 C    Last Pain:  Vitals:   12/11/16 0802  TempSrc: Tympanic         Complications: No apparent anesthesia complications

## 2016-12-11 NOTE — Op Note (Addendum)
[facility] Patient Name: [name] Procedure Date: 12/11/2016 7:45 AM MRN: [id number] Account #: [id number] Date of Birth: March 14, 1936 Admit Type: Outpatient Age: 80 Room: [facility] ENDO ROOM 4 Gender: Female Note Status: Finalized Procedure:            Upper GI endoscopy Indications:          Generalized abdominal pain, Follow-up of Barrett's                        esophagus Providers:            Manya Silvas, MD Referring MD:         Rusty Aus, MD (Referring MD) Medicines:            Propofol per Anesthesia Complications:        No immediate complications. Procedure:            Pre-Anesthesia Assessment:                       - After reviewing the risks and benefits, the patient                        was deemed in satisfactory condition to undergo the                        procedure.                       After obtaining informed consent, the endoscope was                        passed under direct vision. Throughout the procedure,                        the patient's blood pressure, pulse, and oxygen                        saturations were monitored continuously. The Endoscope                        was introduced through the mouth, and advanced to the                        antrum of the stomach. The upper GI endoscopy was                        accomplished without difficulty. The patient tolerated                        the procedure well. Findings:      There were esophageal mucosal changes secondary to established       short-segment Barrett's disease present at the gastroesophageal       junction. The maximum longitudinal extent of these mucosal changes was 1       cm in length. Mucosa was biopsied with a cold forceps for histology. One       specimen bottle was sent to pathology. GEJ 39cm.      A medium amount of food (residue) was found in the gastric antrum. Food       seen in duodenal bulb also and due to this the  duodenum was not entered. Impression:           - Esophageal mucosal changes secondary to established                        short-segment Barrett's disease. Biopsied.                       - A medium amount of food (residue) in the stomach. Recommendation:       - Await pathology results. Resume usual meds. Manya Silvas, MD 12/11/2016 8:03:20 AM This report has been signed electronically. Number of Addenda: 0 Note Initiated On: 12/11/2016 7:45 AM      [facility]

## 2016-12-11 NOTE — H&P (Signed)
Primary Care Physician:  Rusty Aus, MD Primary Gastroenterologist:  Dr. Vira Agar  Pre-Procedure History & Physical: HPI:  Stacy [name] is a 80 y.o. female is here for an endoscopy.   Past Medical History:  Diagnosis Date  . Arthritis   . Atherosclerosis of aorta ([place])   . Breast cancer ([place]) 2008   mastectomy left  . Cancer ([place])    breast  . Diabetes mellitus without complication ([place])   . Dysrhythmia    atrial tachycardia  . Dysrhythmia    PSVT  . Hyperlipidemia   . Hypothyroidism   . Sensorimotor neuropathy [facility])     Past Surgical History:  Procedure Laterality Date  . ABLATION     cardiac  . BREAST BIOPSY Right 04/30/2016   path pending  . BREAST SURGERY    . ELECTROPHYSIOLOGIC STUDY N/A 06/22/2015   Procedure: Cardioversion;  Surgeon: Yolonda Kida, MD;  Location: [facility] ORS;  Service: Cardiovascular;  Laterality: N/A;  . ELECTROPHYSIOLOGIC STUDY N/A 04/09/2016   Procedure: Cardioversion;  Surgeon: Yolonda Kida, MD;  Location: [facility] ORS;  Service: Cardiovascular;  Laterality: N/A;  . MASTECTOMY Left 2008    Prior to Admission medications   Medication Sig Start Date End Date Taking? Authorizing Provider  azelastine (ASTELIN) 0.1 % nasal spray Place 2 sprays into both nostrils 2 (two) times daily. Use in each nostril as directed   Yes Historical Provider, MD  fluticasone (FLONASE) 50 MCG/ACT nasal spray Place 2 sprays into both nostrils daily.   Yes Historical Provider, MD  glucose blood test strip 1 each by Other route 3 (three) times daily. Use as instructed   Yes Historical Provider, MD  metoprolol succinate (TOPROL-XL) 25 MG 24 hr tablet Take 25 mg by mouth 2 (two) times daily.   Yes Historical Provider, MD  omeprazole (PRILOSEC) 40 MG capsule Take 40 mg by mouth 2 (two) times daily.   Yes Historical Provider, MD  sucralfate (CARAFATE) 1 g tablet Take 1 g by mouth 4 (four) times daily -  with meals and at bedtime.   Yes Historical Provider, MD   warfarin (COUMADIN) 5 MG tablet Take 5 mg by mouth daily.   Yes Historical Provider, MD  Ascorbic Acid (VITAMIN C) 1000 MG tablet Take 1,000 mg by mouth daily.    Historical Provider, MD  atorvastatin (LIPITOR) 20 MG tablet Take 20 mg by mouth daily.    Historical Provider, MD  benzonatate (TESSALON) 100 MG capsule Take 100 mg by mouth 3 (three) times daily as needed for cough.    Historical Provider, MD  calcium citrate-vitamin D (CITRACAL+D) 315-200 MG-UNIT per tablet Take 1 tablet by mouth 2 (two) times daily.    Historical Provider, MD  cholecalciferol (VITAMIN D) 1000 UNITS tablet Take 2,000 Units by mouth daily.    Historical Provider, MD  furosemide (LASIX) 40 MG tablet Take 20 mg by mouth 3 (three) times a week.     Historical Provider, MD  gabapentin (NEURONTIN) 300 MG capsule Take 300 mg by mouth 3 (three) times daily.    Historical Provider, MD  glyBURIDE-metformin (GLUCOVANCE) 2.5-500 MG per tablet Take 1 tablet by mouth daily with breakfast.    Historical Provider, MD  insulin glargine (LANTUS) 100 UNIT/ML injection Inject 10 Units into the skin 2 (two) times daily.    Historical Provider, MD  levothyroxine (SYNTHROID, LEVOTHROID) 75 MCG tablet Take 75 mcg by mouth daily before breakfast.    Historical Provider, MD  loperamide (IMODIUM) 2  MG capsule Take 2 mg by mouth as needed for diarrhea or loose stools.    Historical Provider, MD  magnesium oxide (MAG-OX) 400 MG tablet Take 400 mg by mouth daily.    Historical Provider, MD  Multiple Vitamins-Minerals (MULTIVITAMIN WITH MINERALS) tablet Take 1 tablet by mouth daily.    Historical Provider, MD  omega-3 acid ethyl esters (LOVAZA) 1 G capsule Take 1 g by mouth 2 (two) times daily.    Historical Provider, MD  polyethylene glycol (MIRALAX / GLYCOLAX) packet Take 17 g by mouth daily.    Historical Provider, MD  potassium chloride (KLOR-CON) 8 MEQ tablet Take 10 mEq by mouth daily.    Historical Provider, MD  vitamin E 400 UNIT capsule  Take 400 Units by mouth daily.    Historical Provider, MD    Allergies as of 08/01/2016 - Review Complete 04/09/2016  Allergen Reaction Noted  . Morphine and related Anaphylaxis 06/22/2015  . Amiodarone Other (See Comments) 06/22/2015  . Codeine Other (See Comments) 06/22/2015  . Lovaza [omega-3-acid ethyl esters] Diarrhea 06/22/2015  . Zaroxolyn [metolazone] Other (See Comments) 06/22/2015  . Ivp dye [iodinated diagnostic agents] Rash 06/22/2015  . Penicillins Rash 06/22/2015  . Sulfa antibiotics Rash 06/22/2015    Family History  Problem Relation Age of Onset  . Breast cancer Cousin     Social History   Social History  . Marital status: Married    Spouse name: N/A  . Number of children: N/A  . Years of education: N/A   Occupational History  . Not on file.   Social History Main Topics  . Smoking status: Never Smoker  . Smokeless tobacco: Never Used  . Alcohol use No  . Drug use: No  . Sexual activity: Not on file   Other Topics Concern  . Not on file   Social History Narrative  . No narrative on file    Review of Systems: See HPI, otherwise negative ROS  Physical Exam: BP (!) 155/70   Pulse 63   Temp (!) 96.9 F (36.1 C) (Tympanic)   Resp 16   Ht 5\' 5"  (1.651 m)   Wt 65.8 kg (145 lb)   SpO2 99%   BMI 24.13 kg/m  General:   Alert,  pleasant and cooperative in NAD Head:  Normocephalic and atraumatic. Neck:  Supple; no masses or thyromegaly. Lungs:  Clear throughout to auscultation.    Heart:  Regular rate and rhythm. Abdomen:  Soft, nontender and nondistended. Normal bowel sounds, without guarding, and without rebound.   Neurologic:  Alert and  oriented x4;  grossly normal neurologically.  Impression/Plan: Stacy [name] is here for an endoscopy to be performed for generalized abdominal pain.  Risks, benefits, limitations, and alternatives regarding  endoscopy have been reviewed with the patient.  Questions have been answered.  All parties  agreeable.   Gaylyn Cheers, MD  12/11/2016, 7:39 AM

## 2016-12-12 ENCOUNTER — Encounter: Payer: Self-pay | Admitting: Unknown Physician Specialty

## 2016-12-12 LAB — SURGICAL PATHOLOGY

## 2016-12-24 NOTE — Anesthesia Postprocedure Evaluation (Signed)
Anesthesia Post Note  Patient: Juluis Rainier  Procedure(s) Performed: Procedure(s) (LRB): ESOPHAGOGASTRODUODENOSCOPY (EGD) WITH PROPOFOL (N/A)  Patient location during evaluation: PACU Anesthesia Type: General Level of consciousness: awake and alert Pain management: pain level controlled Vital Signs Assessment: post-procedure vital signs reviewed and stable Respiratory status: spontaneous breathing, nonlabored ventilation, respiratory function stable and patient connected to nasal cannula oxygen Cardiovascular status: blood pressure returned to baseline and stable Postop Assessment: no signs of nausea or vomiting Anesthetic complications: no     Last Vitals:  Vitals:   12/11/16 0920 12/11/16 0930  BP: (!) 98/56 111/67  Pulse: 70 67  Resp: 17 19  Temp:      Last Pain:  Vitals:   12/12/16 0738  TempSrc:   PainSc: 0-No pain                 Molli Barrows

## 2017-02-11 ENCOUNTER — Other Ambulatory Visit: Payer: Self-pay | Admitting: Internal Medicine

## 2017-02-11 DIAGNOSIS — Z1231 Encounter for screening mammogram for malignant neoplasm of breast: Secondary | ICD-10-CM

## 2017-02-11 DIAGNOSIS — Z853 Personal history of malignant neoplasm of breast: Secondary | ICD-10-CM

## 2017-05-01 ENCOUNTER — Ambulatory Visit
Admission: RE | Admit: 2017-05-01 | Discharge: 2017-05-01 | Disposition: A | Discharge status: Home / Self Care | Payer: Medicare Other | Source: Ambulatory Visit | Attending: Internal Medicine | Admitting: Internal Medicine

## 2017-05-01 DIAGNOSIS — Z1231 Encounter for screening mammogram for malignant neoplasm of breast: Secondary | ICD-10-CM | POA: Insufficient documentation

## 2018-03-12 ENCOUNTER — Other Ambulatory Visit: Payer: Self-pay | Admitting: Internal Medicine

## 2018-03-12 DIAGNOSIS — Z1231 Encounter for screening mammogram for malignant neoplasm of breast: Secondary | ICD-10-CM

## 2018-05-12 ENCOUNTER — Ambulatory Visit
Admission: RE | Admit: 2018-05-12 | Discharge: 2018-05-12 | Disposition: A | Discharge status: Home / Self Care | Payer: Medicare Other | Source: Ambulatory Visit | Attending: Internal Medicine | Admitting: Internal Medicine

## 2018-05-12 DIAGNOSIS — Z1231 Encounter for screening mammogram for malignant neoplasm of breast: Secondary | ICD-10-CM

## 2018-05-19 ENCOUNTER — Other Ambulatory Visit: Payer: Self-pay | Admitting: Student

## 2018-05-19 DIAGNOSIS — K2950 Unspecified chronic gastritis without bleeding: Secondary | ICD-10-CM

## 2018-05-19 DIAGNOSIS — K219 Gastro-esophageal reflux disease without esophagitis: Secondary | ICD-10-CM

## 2018-05-26 ENCOUNTER — Ambulatory Visit
Admission: RE | Admit: 2018-05-26 | Discharge: 2018-05-26 | Disposition: A | Discharge status: Home / Self Care | Payer: Medicare Other | Source: Ambulatory Visit | Attending: Student | Admitting: Student

## 2018-05-26 DIAGNOSIS — K2950 Unspecified chronic gastritis without bleeding: Secondary | ICD-10-CM

## 2018-05-26 DIAGNOSIS — K228 Other specified diseases of esophagus: Secondary | ICD-10-CM | POA: Insufficient documentation

## 2018-05-26 DIAGNOSIS — K219 Gastro-esophageal reflux disease without esophagitis: Secondary | ICD-10-CM | POA: Diagnosis present

## 2018-09-02 ENCOUNTER — Encounter: Payer: Self-pay | Admitting: Emergency Medicine

## 2018-09-02 ENCOUNTER — Other Ambulatory Visit: Payer: Self-pay

## 2018-09-02 ENCOUNTER — Emergency Department: Payer: Medicare Other

## 2018-09-02 ENCOUNTER — Emergency Department
Admission: EM | Admit: 2018-09-02 | Discharge: 2018-09-02 | Disposition: A | Discharge status: Home / Self Care | Payer: Medicare Other | Attending: Emergency Medicine | Admitting: Emergency Medicine

## 2018-09-02 DIAGNOSIS — Z794 Long term (current) use of insulin: Secondary | ICD-10-CM | POA: Diagnosis not present

## 2018-09-02 DIAGNOSIS — Z7901 Long term (current) use of anticoagulants: Secondary | ICD-10-CM | POA: Insufficient documentation

## 2018-09-02 DIAGNOSIS — E119 Type 2 diabetes mellitus without complications: Secondary | ICD-10-CM | POA: Insufficient documentation

## 2018-09-02 DIAGNOSIS — I2510 Atherosclerotic heart disease of native coronary artery without angina pectoris: Secondary | ICD-10-CM | POA: Insufficient documentation

## 2018-09-02 DIAGNOSIS — Z79899 Other long term (current) drug therapy: Secondary | ICD-10-CM | POA: Insufficient documentation

## 2018-09-02 DIAGNOSIS — R079 Chest pain, unspecified: Secondary | ICD-10-CM | POA: Diagnosis not present

## 2018-09-02 DIAGNOSIS — E039 Hypothyroidism, unspecified: Secondary | ICD-10-CM | POA: Diagnosis not present

## 2018-09-02 DIAGNOSIS — Z853 Personal history of malignant neoplasm of breast: Secondary | ICD-10-CM | POA: Insufficient documentation

## 2018-09-02 LAB — BASIC METABOLIC PANEL
ANION GAP: 6 (ref 5–15)
BUN: 16 mg/dL (ref 8–23)
CALCIUM: 9 mg/dL (ref 8.9–10.3)
CHLORIDE: 106 mmol/L (ref 98–111)
CO2: 28 mmol/L (ref 22–32)
Creatinine, Ser: 0.88 mg/dL (ref 0.44–1.00)
GFR calc Af Amer: >60 mL/min (ref >60)
GFR calc non Af Amer: >60 mL/min (ref >60)
GLUCOSE: 181 mg/dL — AB (ref 70–99)
POTASSIUM: 4.1 mmol/L (ref 3.5–5.1)
Sodium: 140 mmol/L (ref 135–145)

## 2018-09-02 LAB — CBC
HCT: 38.2 % (ref 35.0–47.0)
Hemoglobin: 13.1 g/dL (ref 12.0–16.0)
MCH: 31.6 pg (ref 26.0–34.0)
MCHC: 34.2 g/dL (ref 32.0–36.0)
MCV: 92.4 fL (ref 80.0–100.0)
PLATELETS: 129 10*3/uL — AB (ref 150–440)
RBC: 4.13 MIL/uL (ref 3.80–5.20)
RDW: 13.7 % (ref 11.5–14.5)
WBC: 7.3 10*3/uL (ref 3.6–11.0)

## 2018-09-02 LAB — TROPONIN I: Troponin I: <0.03 ng/mL (ref <0.03)

## 2018-09-02 LAB — PROTIME-INR
INR: 2.42
Prothrombin Time: 26.1 seconds — ABNORMAL HIGH (ref 11.4–15.2)

## 2018-09-02 NOTE — ED Provider Notes (Addendum)
[facility] Emergency Department Provider Note   ____________________________________________    I have reviewed the triage vital signs and the nursing notes.   HISTORY  Chief Complaint Chest Pain     HPI Stacy [name] is a 82 y.o. female who presents with complaints of chest pain.  Patient has a history of diabetes, atrial fibrillation, she has had EP procedures in the past.  She is never had chest pain however, she reports the pain started this morning and is described as a pressure-like pain, central and left chest with some radiation into her neck and shoulder.  No significant shortness of breath.  No significant pleurisy.  She has never had this before.  She took her metoprolol this morning but no other medications.  Past Medical History:  Diagnosis Date  . Arthritis   . Atherosclerosis of aorta ([place])   . Breast cancer ([place]) 2008   mastectomy left  . Cancer ([place])    breast  . Diabetes mellitus without complication ([place])   . Dysrhythmia    atrial tachycardia  . Dysrhythmia    PSVT  . Hyperlipidemia   . Hypothyroidism   . Sensorimotor neuropathy     There are no active problems to display for this patient.   Past Surgical History:  Procedure Laterality Date  . ABLATION     cardiac  . BREAST BIOPSY Right 04/30/2016   path pending  . BREAST SURGERY    . ELECTROPHYSIOLOGIC STUDY N/A 06/22/2015   Procedure: Cardioversion;  Surgeon: Yolonda Kida, MD;  Location: [facility] ORS;  Service: Cardiovascular;  Laterality: N/A;  . ELECTROPHYSIOLOGIC STUDY N/A 04/09/2016   Procedure: Cardioversion;  Surgeon: Yolonda Kida, MD;  Location: [facility] ORS;  Service: Cardiovascular;  Laterality: N/A;  . ESOPHAGOGASTRODUODENOSCOPY (EGD) WITH PROPOFOL N/A 12/11/2016   Procedure: ESOPHAGOGASTRODUODENOSCOPY (EGD) WITH PROPOFOL;  Surgeon: Manya Silvas, MD;  Location: [facility] ENDOSCOPY;  Service: Endoscopy;  Laterality: N/A;  . MASTECTOMY Left 2008     Prior to Admission medications   Medication Sig Start Date End Date Taking? Authorizing Provider  Ascorbic Acid (VITAMIN C) 1000 MG tablet Take 1,000 mg by mouth daily.    [provider]  atorvastatin (LIPITOR) 20 MG tablet Take 20 mg by mouth daily.    [provider]  azelastine (ASTELIN) 0.1 % nasal spray Place 2 sprays into both nostrils 2 (two) times daily. Use in each nostril as directed    [provider]  benzonatate (TESSALON) 100 MG capsule Take 100 mg by mouth 3 (three) times daily as needed for cough.    [provider]  calcium citrate-vitamin D (CITRACAL+D) 315-200 MG-UNIT per tablet Take 1 tablet by mouth 2 (two) times daily.    [provider]  cholecalciferol (VITAMIN D) 1000 UNITS tablet Take 2,000 Units by mouth daily.    [provider]  fluticasone (FLONASE) 50 MCG/ACT nasal spray Place 2 sprays into both nostrils daily.    [provider]  furosemide (LASIX) 40 MG tablet Take 20 mg by mouth 3 (three) times a week.     [provider]  gabapentin (NEURONTIN) 300 MG capsule Take 300 mg by mouth 3 (three) times daily.    [provider]  glucose blood test strip 1 each by Other route 3 (three) times daily. Use as instructed    [provider]  glyBURIDE-metformin (GLUCOVANCE) 2.5-500 MG per tablet Take 1 tablet by mouth daily with breakfast.    [provider]  insulin glargine (LANTUS) 100 UNIT/ML injection Inject 10 Units into the skin 2 (two) times daily.    [provider]  levothyroxine (SYNTHROID, LEVOTHROID) 75 MCG tablet Take 75 mcg by mouth daily before breakfast.    [provider]  loperamide (IMODIUM) 2 MG capsule Take 2 mg by mouth as needed for diarrhea or loose stools.    [provider]  magnesium oxide (MAG-OX) 400 MG tablet Take 400 mg by mouth daily.    [provider]  metoprolol succinate (TOPROL-XL) 25 MG 24 hr tablet  Take 25 mg by mouth 2 (two) times daily.    [provider]  Multiple Vitamins-Minerals (MULTIVITAMIN WITH MINERALS) tablet Take 1 tablet by mouth daily.    [provider]  omega-3 acid ethyl esters (LOVAZA) 1 G capsule Take 1 g by mouth 2 (two) times daily.    [provider]  omeprazole (PRILOSEC) 40 MG capsule Take 40 mg by mouth 2 (two) times daily.    [provider]  polyethylene glycol (MIRALAX / GLYCOLAX) packet Take 17 g by mouth daily.    [provider]  potassium chloride (KLOR-CON) 8 MEQ tablet Take 10 mEq by mouth daily.    [provider]  sucralfate (CARAFATE) 1 g tablet Take 1 g by mouth 4 (four) times daily -  with meals and at bedtime.    [provider]  vitamin E 400 UNIT capsule Take 400 Units by mouth daily.    [provider]  warfarin (COUMADIN) 5 MG tablet Take 5 mg by mouth daily.    [provider]     Allergies Morphine and related; Amiodarone; Codeine; Lovaza [omega-3-acid ethyl esters]; Zaroxolyn [metolazone]; Ivp dye [iodinated diagnostic agents]; Penicillins; and Sulfa antibiotics  Family History  Problem Relation Age of Onset  . Breast cancer Cousin     Social History Social History   Tobacco Use  . Smoking status: Never Smoker  . Smokeless tobacco: Never Used  Substance Use Topics  . Alcohol use: No  . Drug use: No    Review of Systems  Constitutional: No fever/chills Eyes: No visual changes.  ENT: No sore throat. Cardiovascular: As above Respiratory: Denies shortness of breath. Gastrointestinal: No abdominal pain.  No nausea, no vomiting.   Genitourinary: Negative for dysuria. Musculoskeletal: Negative for back pain. Skin: Negative for rash. Neurological: Negative for headaches or weakness   ____________________________________________   PHYSICAL EXAM:  VITAL SIGNS: ED Triage Vitals  Enc Vitals Group     BP 09/02/18 0921 117/80     Pulse Rate  09/02/18 0921 (!) 113     Resp 09/02/18 0921 18     Temp 09/02/18 0921 97.9 F (36.6 C)     Temp Source 09/02/18 0921 Oral     SpO2 09/02/18 0921 98 %     Weight 09/02/18 0919 71.7 kg (158 lb)     Height 09/02/18 0919 1.651 m (5\' 5" )     Head Circumference --      Peak Flow --      Pain Score 09/02/18 0919 7     Pain Loc --      Pain Edu? --      Excl. in [place]? --     Constitutional: Alert and oriented.  Eyes: Conjunctivae are normal.   Nose: No congestion/rhinnorhea. Mouth/Throat: Mucous membranes are moist.   Neck:  Painless ROM Cardiovascular: Tachycardia, irregular rhythm grossly normal heart sounds.  Good peripheral circulation. Respiratory:  Normal respiratory effort.  No retractions. Lungs CTAB. Gastrointestinal: Soft and nontender. No distention.  No CVA tenderness.  Musculoskeletal: No lower extremity tenderness nor edema.  Warm and well perfused Neurologic:  Normal speech and language. No gross focal neurologic deficits are appreciated.  Skin:  Skin is warm, dry and intact. No rash noted. Psychiatric: Mood and affect are normal. Speech and behavior are normal.  ____________________________________________   LABS (all labs ordered are listed, but only abnormal results are displayed)  Labs Reviewed  BASIC METABOLIC PANEL - Abnormal; Notable for the following components:      Result Value   Glucose, Bld 181 (*)    All other components within normal limits  CBC - Abnormal; Notable for the following components:   Platelets 129 (*)    All other components within normal limits  PROTIME-INR - Abnormal; Notable for the following components:   Prothrombin Time 26.1 (*)    All other components within normal limits  TROPONIN I   ____________________________________________  EKG  ED ECG REPORT I, Lavonia Drafts, the attending physician, personally viewed and interpreted this ECG.  Date: 09/02/2018  Rhythm: Atrial fibrillation QRS Axis: normal Intervals:  normal ST/T Wave abnormalities: Nonspecific changes   ____________________________________________  RADIOLOGY  Chest x-ray normal ____________________________________________   PROCEDURES  Procedure(s) performed: No  Procedures   Critical Care performed: No ____________________________________________   INITIAL IMPRESSION / ASSESSMENT AND PLAN / ED COURSE  Pertinent labs & imaging results that were available during my care of the patient were reviewed by me and considered in my medical decision making (see chart for details).  Patient presents with chest pain with radiation to the neck.  She is on Coumadin for atrial fibrillation and is therapeutic.  EKG demonstrates some minor ST depressions inferiorly, troponin is normal.  However given risk factors of age, diabetes, tachycardia will admit for further evaluation.  ----------------------------------------- 1:39 PM on 09/02/2018 -----------------------------------------  Dr. Vianne Bulls has evaluated the patient and discussed with Dr. Clayborn Bigness, they feel the patient can have a second troponin and then possibly be discharged    ____________________________________________   FINAL CLINICAL IMPRESSION(S) / ED DIAGNOSES  Final diagnoses:  Chest pain, unspecified type        Note:  This document was prepared using Dragon voice recognition software and may include unintentional dictation errors.      Lavonia Drafts, MD 09/02/18 1124    Lavonia Drafts, MD 09/02/18 1340

## 2018-09-02 NOTE — ED Notes (Signed)

## 2018-09-02 NOTE — ED Notes (Signed)
Returned from XR 

## 2018-09-02 NOTE — ED Triage Notes (Signed)
Pt arrived with complaints of left sided chest pain that radiates to her neck and head. Pt reports the pain feels like pressure. Pt a&o x 4

## 2018-09-02 NOTE — ED Notes (Signed)
First Nurse Note: Patient complaining of 10/10 chest pain radiating into neck.  Alert and oriented, stat registered.  EKG completed.  Patient to Rm 3 via WC without Triage, Charge Nurse aware.

## 2018-09-02 NOTE — Progress Notes (Signed)
If second troponin'is negative patient can be discharged to home as per my discussion with Dr. Clayborn Bigness, patient will be seen in his office tomorrow.  Same discussed with patient, patient agreeable to go home if the second troponin is negative.  Patient can be discharged from the ER  if second troponin is negative. spoke with Dr. Corky Downs.  Patient does not need admission.  Follow-up with Dr. Clayborn Bigness in the office tomorrow.  Patient is to call Dr. Clayborn Bigness office, he can see her tomorrow morning as per my discussion with him.

## 2018-09-02 NOTE — ED Provider Notes (Signed)
2nd troponin normal. Discharging for Dr. Vianne Bulls because she does not know how to discharge from the ED   Lavonia Drafts, MD 09/02/18 1352

## 2018-10-31 ENCOUNTER — Other Ambulatory Visit
Admission: RE | Admit: 2018-10-31 | Discharge: 2018-10-31 | Disposition: A | Discharge status: Home / Self Care | Payer: Medicare Other | Source: Ambulatory Visit | Attending: Family Medicine | Admitting: Family Medicine

## 2018-10-31 DIAGNOSIS — I480 Paroxysmal atrial fibrillation: Secondary | ICD-10-CM | POA: Diagnosis present

## 2018-10-31 LAB — PROTIME-INR
INR: 2.86
Prothrombin Time: 29.6 seconds — ABNORMAL HIGH (ref 11.4–15.2)

## 2019-01-05 ENCOUNTER — Encounter (HOSPITAL_COMMUNITY)
Admission: EM | Disposition: A | Discharge status: Hospice | Payer: Self-pay | Source: Home / Self Care | Attending: Internal Medicine

## 2019-01-05 ENCOUNTER — Emergency Department (HOSPITAL_COMMUNITY): Payer: Medicare Other | Admitting: Certified Registered Nurse Anesthetist

## 2019-01-05 ENCOUNTER — Emergency Department (HOSPITAL_COMMUNITY): Payer: Medicare Other

## 2019-01-05 ENCOUNTER — Inpatient Hospital Stay (HOSPITAL_COMMUNITY): Payer: Medicare Other

## 2019-01-05 ENCOUNTER — Inpatient Hospital Stay (HOSPITAL_COMMUNITY)
Admission: EM | Admit: 2019-01-05 | Discharge: 2019-01-15 | DRG: 023 | Disposition: A | Discharge status: Hospice | Payer: Medicare Other | Attending: Family Medicine | Admitting: Family Medicine

## 2019-01-05 ENCOUNTER — Encounter (HOSPITAL_COMMUNITY): Payer: Self-pay | Admitting: Emergency Medicine

## 2019-01-05 DIAGNOSIS — S065X9A Traumatic subdural hemorrhage with loss of consciousness of unspecified duration, initial encounter: Secondary | ICD-10-CM | POA: Diagnosis not present

## 2019-01-05 DIAGNOSIS — J690 Pneumonitis due to inhalation of food and vomit: Secondary | ICD-10-CM | POA: Diagnosis not present

## 2019-01-05 DIAGNOSIS — Z885 Allergy status to narcotic agent status: Secondary | ICD-10-CM

## 2019-01-05 DIAGNOSIS — Y92009 Unspecified place in unspecified non-institutional (private) residence as the place of occurrence of the external cause: Secondary | ICD-10-CM

## 2019-01-05 DIAGNOSIS — I6602 Occlusion and stenosis of left middle cerebral artery: Secondary | ICD-10-CM | POA: Diagnosis present

## 2019-01-05 DIAGNOSIS — I361 Nonrheumatic tricuspid (valve) insufficiency: Secondary | ICD-10-CM

## 2019-01-05 DIAGNOSIS — R0902 Hypoxemia: Secondary | ICD-10-CM

## 2019-01-05 DIAGNOSIS — I482 Chronic atrial fibrillation, unspecified: Secondary | ICD-10-CM | POA: Diagnosis present

## 2019-01-05 DIAGNOSIS — Y92012 Bathroom of single-family (private) house as the place of occurrence of the external cause: Secondary | ICD-10-CM | POA: Diagnosis not present

## 2019-01-05 DIAGNOSIS — W1830XA Fall on same level, unspecified, initial encounter: Secondary | ICD-10-CM | POA: Diagnosis present

## 2019-01-05 DIAGNOSIS — E039 Hypothyroidism, unspecified: Secondary | ICD-10-CM | POA: Diagnosis present

## 2019-01-05 DIAGNOSIS — R1310 Dysphagia, unspecified: Secondary | ICD-10-CM | POA: Diagnosis present

## 2019-01-05 DIAGNOSIS — Z79899 Other long term (current) drug therapy: Secondary | ICD-10-CM

## 2019-01-05 DIAGNOSIS — Z7951 Long term (current) use of inhaled steroids: Secondary | ICD-10-CM | POA: Diagnosis not present

## 2019-01-05 DIAGNOSIS — R4701 Aphasia: Secondary | ICD-10-CM | POA: Diagnosis present

## 2019-01-05 DIAGNOSIS — R296 Repeated falls: Secondary | ICD-10-CM | POA: Diagnosis present

## 2019-01-05 DIAGNOSIS — Z515 Encounter for palliative care: Secondary | ICD-10-CM | POA: Diagnosis not present

## 2019-01-05 DIAGNOSIS — I639 Cerebral infarction, unspecified: Secondary | ICD-10-CM | POA: Diagnosis present

## 2019-01-05 DIAGNOSIS — I313 Pericardial effusion (noninflammatory): Secondary | ICD-10-CM | POA: Diagnosis present

## 2019-01-05 DIAGNOSIS — S065X0A Traumatic subdural hemorrhage without loss of consciousness, initial encounter: Secondary | ICD-10-CM | POA: Diagnosis present

## 2019-01-05 DIAGNOSIS — Z7901 Long term (current) use of anticoagulants: Secondary | ICD-10-CM

## 2019-01-05 DIAGNOSIS — J90 Pleural effusion, not elsewhere classified: Secondary | ICD-10-CM | POA: Diagnosis present

## 2019-01-05 DIAGNOSIS — R740 Nonspecific elevation of levels of transaminase and lactic acid dehydrogenase [LDH]: Secondary | ICD-10-CM

## 2019-01-05 DIAGNOSIS — Z794 Long term (current) use of insulin: Secondary | ICD-10-CM | POA: Diagnosis not present

## 2019-01-05 DIAGNOSIS — Z853 Personal history of malignant neoplasm of breast: Secondary | ICD-10-CM

## 2019-01-05 DIAGNOSIS — M1990 Unspecified osteoarthritis, unspecified site: Secondary | ICD-10-CM | POA: Diagnosis present

## 2019-01-05 DIAGNOSIS — Z79890 Hormone replacement therapy (postmenopausal): Secondary | ICD-10-CM

## 2019-01-05 DIAGNOSIS — D649 Anemia, unspecified: Secondary | ICD-10-CM

## 2019-01-05 DIAGNOSIS — R791 Abnormal coagulation profile: Secondary | ICD-10-CM | POA: Diagnosis present

## 2019-01-05 DIAGNOSIS — R414 Neurologic neglect syndrome: Secondary | ICD-10-CM | POA: Diagnosis present

## 2019-01-05 DIAGNOSIS — G9340 Encephalopathy, unspecified: Secondary | ICD-10-CM | POA: Diagnosis present

## 2019-01-05 DIAGNOSIS — G8191 Hemiplegia, unspecified affecting right dominant side: Secondary | ICD-10-CM | POA: Diagnosis present

## 2019-01-05 DIAGNOSIS — Z950 Presence of cardiac pacemaker: Secondary | ICD-10-CM

## 2019-01-05 DIAGNOSIS — H53461 Homonymous bilateral field defects, right side: Secondary | ICD-10-CM | POA: Diagnosis present

## 2019-01-05 DIAGNOSIS — E785 Hyperlipidemia, unspecified: Secondary | ICD-10-CM | POA: Diagnosis present

## 2019-01-05 DIAGNOSIS — Z9012 Acquired absence of left breast and nipple: Secondary | ICD-10-CM

## 2019-01-05 DIAGNOSIS — E1151 Type 2 diabetes mellitus with diabetic peripheral angiopathy without gangrene: Secondary | ICD-10-CM | POA: Diagnosis present

## 2019-01-05 DIAGNOSIS — W19XXXA Unspecified fall, initial encounter: Secondary | ICD-10-CM | POA: Diagnosis not present

## 2019-01-05 DIAGNOSIS — D6489 Other specified anemias: Secondary | ICD-10-CM | POA: Diagnosis present

## 2019-01-05 DIAGNOSIS — E119 Type 2 diabetes mellitus without complications: Secondary | ICD-10-CM | POA: Diagnosis not present

## 2019-01-05 DIAGNOSIS — I63412 Cerebral infarction due to embolism of left middle cerebral artery: Secondary | ICD-10-CM | POA: Diagnosis present

## 2019-01-05 DIAGNOSIS — R29810 Facial weakness: Secondary | ICD-10-CM | POA: Diagnosis present

## 2019-01-05 DIAGNOSIS — Z66 Do not resuscitate: Secondary | ICD-10-CM | POA: Diagnosis present

## 2019-01-05 DIAGNOSIS — I480 Paroxysmal atrial fibrillation: Secondary | ICD-10-CM | POA: Diagnosis present

## 2019-01-05 DIAGNOSIS — Z803 Family history of malignant neoplasm of breast: Secondary | ICD-10-CM | POA: Diagnosis not present

## 2019-01-05 DIAGNOSIS — R4781 Slurred speech: Secondary | ICD-10-CM | POA: Diagnosis present

## 2019-01-05 DIAGNOSIS — R064 Hyperventilation: Secondary | ICD-10-CM

## 2019-01-05 DIAGNOSIS — I10 Essential (primary) hypertension: Secondary | ICD-10-CM | POA: Diagnosis present

## 2019-01-05 DIAGNOSIS — R29706 NIHSS score 6: Secondary | ICD-10-CM | POA: Diagnosis present

## 2019-01-05 DIAGNOSIS — R7401 Elevation of levels of liver transaminase levels: Secondary | ICD-10-CM

## 2019-01-05 DIAGNOSIS — S065XAA Traumatic subdural hemorrhage with loss of consciousness status unknown, initial encounter: Secondary | ICD-10-CM

## 2019-01-05 HISTORY — PX: RADIOLOGY WITH ANESTHESIA: SHX6223

## 2019-01-05 HISTORY — PX: IR CT HEAD LTD: IMG2386

## 2019-01-05 HISTORY — PX: IR PERCUTANEOUS ART THROMBECTOMY/INFUSION INTRACRANIAL INC DIAG ANGIO: IMG6087

## 2019-01-05 HISTORY — PX: IR PTA INTRACRANIAL: IMG2344

## 2019-01-05 LAB — COMPREHENSIVE METABOLIC PANEL
ALBUMIN: 3.2 g/dL — AB (ref 3.5–5.0)
ALT: 175 U/L — ABNORMAL HIGH (ref 0–44)
AST: 47 U/L — ABNORMAL HIGH (ref 15–41)
Alkaline Phosphatase: 51 U/L (ref 38–126)
Anion gap: 14 (ref 5–15)
BUN: 8 mg/dL (ref 8–23)
CHLORIDE: 104 mmol/L (ref 98–111)
CO2: 22 mmol/L (ref 22–32)
Calcium: 8.9 mg/dL (ref 8.9–10.3)
Creatinine, Ser: 1.01 mg/dL — ABNORMAL HIGH (ref 0.44–1.00)
GFR calc Af Amer: >60 mL/min (ref >60)
GFR calc non Af Amer: 53 mL/min — ABNORMAL LOW (ref >60)
Glucose, Bld: 112 mg/dL — ABNORMAL HIGH (ref 70–99)
Potassium: 3.6 mmol/L (ref 3.5–5.1)
Sodium: 140 mmol/L (ref 135–145)
Total Bilirubin: 1.4 mg/dL — ABNORMAL HIGH (ref 0.3–1.2)
Total Protein: 6 g/dL — ABNORMAL LOW (ref 6.5–8.1)

## 2019-01-05 LAB — RAPID URINE DRUG SCREEN, HOSP PERFORMED
Amphetamines: NOT DETECTED
BENZODIAZEPINES: NOT DETECTED
Barbiturates: NOT DETECTED
Cocaine: NOT DETECTED
Opiates: NOT DETECTED
Tetrahydrocannabinol: NOT DETECTED

## 2019-01-05 LAB — CBG MONITORING, ED: Glucose-Capillary: 137 mg/dL — ABNORMAL HIGH (ref 70–99)

## 2019-01-05 LAB — CBC
HCT: 32.6 % — ABNORMAL LOW (ref 36.0–46.0)
Hemoglobin: 10.1 g/dL — ABNORMAL LOW (ref 12.0–15.0)
MCH: 30.1 pg (ref 26.0–34.0)
MCHC: 31 g/dL (ref 30.0–36.0)
MCV: 97.3 fL (ref 80.0–100.0)
Platelets: 194 10*3/uL (ref 150–400)
RBC: 3.35 MIL/uL — ABNORMAL LOW (ref 3.87–5.11)
RDW: 14.3 % (ref 11.5–15.5)
WBC: 8.6 10*3/uL (ref 4.0–10.5)
nRBC: 0 % (ref 0.0–0.2)

## 2019-01-05 LAB — PROTIME-INR
INR: 1.59
Prothrombin Time: 18.8 seconds — ABNORMAL HIGH (ref 11.4–15.2)

## 2019-01-05 LAB — URINALYSIS, ROUTINE W REFLEX MICROSCOPIC
BILIRUBIN URINE: NEGATIVE
Glucose, UA: 50 mg/dL — AB
HGB URINE DIPSTICK: NEGATIVE
Ketones, ur: 20 mg/dL — AB
Leukocytes, UA: NEGATIVE
Nitrite: NEGATIVE
Protein, ur: NEGATIVE mg/dL
Specific Gravity, Urine: >1.046 — ABNORMAL HIGH (ref 1.005–1.030)
pH: 6 (ref 5.0–8.0)

## 2019-01-05 LAB — MRSA PCR SCREENING: MRSA BY PCR: NEGATIVE

## 2019-01-05 LAB — DIFFERENTIAL
Abs Immature Granulocytes: 0.03 10*3/uL (ref 0.00–0.07)
Basophils Absolute: 0 10*3/uL (ref 0.0–0.1)
Basophils Relative: 0 %
Eosinophils Absolute: 0 10*3/uL (ref 0.0–0.5)
Eosinophils Relative: 0 %
IMMATURE GRANULOCYTES: 0 %
Lymphocytes Relative: 9 %
Lymphs Abs: 0.8 10*3/uL (ref 0.7–4.0)
Monocytes Absolute: 0.9 10*3/uL (ref 0.1–1.0)
Monocytes Relative: 11 %
NEUTROS PCT: 80 %
Neutro Abs: 6.8 10*3/uL (ref 1.7–7.7)

## 2019-01-05 LAB — GLUCOSE, CAPILLARY
Glucose-Capillary: 179 mg/dL — ABNORMAL HIGH (ref 70–99)
Glucose-Capillary: 155 mg/dL — ABNORMAL HIGH (ref 70–99)
Glucose-Capillary: 141 mg/dL — ABNORMAL HIGH (ref 70–99)

## 2019-01-05 LAB — ECHOCARDIOGRAM COMPLETE

## 2019-01-05 LAB — APTT: APTT: 29 s (ref 24–36)

## 2019-01-05 LAB — ETHANOL: Alcohol, Ethyl (B): <10 mg/dL (ref <10)

## 2019-01-05 LAB — TROPONIN I: Troponin I: 0.03 ng/mL (ref <0.03)

## 2019-01-05 SURGERY — IR WITH ANESTHESIA
Anesthesia: General

## 2019-01-05 MED ORDER — SODIUM CHLORIDE (PF) 0.9 % IJ SOLN: INTRAVENOUS | Status: AC | PRN

## 2019-01-05 MED ORDER — GABAPENTIN 300 MG PO CAPS
300.0000 mg | ORAL_CAPSULE | Freq: Three times a day (TID) | ORAL | Status: DC
  Filled 2019-01-05: qty 1

## 2019-01-05 MED ORDER — SODIUM CHLORIDE 0.9 % IV SOLN
INTRAVENOUS | Status: DC | PRN
  Administered 2019-01-05: 20 ug/min via INTRAVENOUS

## 2019-01-05 MED ORDER — FUROSEMIDE 20 MG PO TABS: 20.0000 mg | ORAL_TABLET | ORAL | Status: DC

## 2019-01-05 MED ORDER — DIPHENHYDRAMINE HCL 50 MG/ML IJ SOLN
50.0000 mg | Freq: Once | INTRAMUSCULAR | Status: AC
  Administered 2019-01-05: 50 mg via INTRAVENOUS
  Filled 2019-01-05: qty 1

## 2019-01-05 MED ORDER — FENTANYL CITRATE (PF) 100 MCG/2ML IJ SOLN
INTRAMUSCULAR | Status: AC
  Filled 2019-01-05: qty 2

## 2019-01-05 MED ORDER — ACETAMINOPHEN 325 MG PO TABS: 650.0000 mg | ORAL_TABLET | ORAL | Status: DC | PRN

## 2019-01-05 MED ORDER — ASPIRIN 81 MG PO CHEW
CHEWABLE_TABLET | ORAL | Status: AC
  Filled 2019-01-05: qty 1

## 2019-01-05 MED ORDER — TIROFIBAN HCL IN NACL 5-0.9 MG/100ML-% IV SOLN
INTRAVENOUS | Status: AC
  Filled 2019-01-05: qty 100

## 2019-01-05 MED ORDER — SUCRALFATE 1 G PO TABS
1.0000 g | ORAL_TABLET | Freq: Three times a day (TID) | ORAL | Status: DC
  Filled 2019-01-05 (×11): qty 1

## 2019-01-05 MED ORDER — HYDROCORTISONE NA SUCCINATE PF 250 MG IJ SOLR
200.0000 mg | Freq: Once | INTRAMUSCULAR | Status: AC
  Administered 2019-01-05: 200 mg via INTRAVENOUS
  Filled 2019-01-05: qty 200

## 2019-01-05 MED ORDER — FENTANYL CITRATE (PF) 100 MCG/2ML IJ SOLN
INTRAMUSCULAR | Status: DC | PRN
  Administered 2019-01-05: 50 ug via INTRAVENOUS
  Administered 2019-01-05: 100 ug via INTRAVENOUS

## 2019-01-05 MED ORDER — ATORVASTATIN CALCIUM 10 MG PO TABS: 20.0000 mg | ORAL_TABLET | Freq: Every day | ORAL | Status: DC

## 2019-01-05 MED ORDER — ACETAMINOPHEN 650 MG RE SUPP
650.0000 mg | RECTAL | Status: DC | PRN
  Administered 2019-01-06 (×2): 650 mg via RECTAL
  Filled 2019-01-05 (×2): qty 1

## 2019-01-05 MED ORDER — SODIUM CHLORIDE 0.9 % IV SOLN
INTRAVENOUS | Status: DC
  Administered 2019-01-05 – 2019-01-07 (×6): via INTRAVENOUS

## 2019-01-05 MED ORDER — IOPAMIDOL (ISOVUE-370) INJECTION 76%: 100.0000 mL | Freq: Once | INTRAVENOUS | Status: DC

## 2019-01-05 MED ORDER — CLEVIDIPINE BUTYRATE 0.5 MG/ML IV EMUL
0.0000 mg/h | INTRAVENOUS | Status: DC
  Administered 2019-01-05: 1 mg/h via INTRAVENOUS
  Administered 2019-01-06: 8 mg/h via INTRAVENOUS
  Administered 2019-01-06: 4 mg/h via INTRAVENOUS
  Filled 2019-01-05 (×4): qty 50

## 2019-01-05 MED ORDER — PROPOFOL 10 MG/ML IV BOLUS
INTRAVENOUS | Status: DC | PRN
  Administered 2019-01-05: 100 mg via INTRAVENOUS

## 2019-01-05 MED ORDER — NITROGLYCERIN 1 MG/10 ML FOR IR/CATH LAB
INTRA_ARTERIAL | Status: AC
  Filled 2019-01-05: qty 10

## 2019-01-05 MED ORDER — MAGNESIUM OXIDE 400 (241.3 MG) MG PO TABS
400.0000 mg | ORAL_TABLET | Freq: Every day | ORAL | Status: DC
  Filled 2019-01-05: qty 1

## 2019-01-05 MED ORDER — CLOPIDOGREL BISULFATE 300 MG PO TABS
ORAL_TABLET | ORAL | Status: AC
  Filled 2019-01-05: qty 1

## 2019-01-05 MED ORDER — ACETAMINOPHEN 160 MG/5ML PO SOLN: 650.0000 mg | ORAL | Status: DC | PRN

## 2019-01-05 MED ORDER — NITROGLYCERIN 1 MG/10 ML FOR IR/CATH LAB
INTRA_ARTERIAL | Status: AC | PRN
  Administered 2019-01-05: 25 ug via INTRA_ARTERIAL

## 2019-01-05 MED ORDER — SUGAMMADEX SODIUM 200 MG/2ML IV SOLN
INTRAVENOUS | Status: DC | PRN
  Administered 2019-01-05: 200 mg via INTRAVENOUS

## 2019-01-05 MED ORDER — METOPROLOL SUCCINATE ER 25 MG PO TB24: 50.0000 mg | ORAL_TABLET | Freq: Two times a day (BID) | ORAL | Status: DC

## 2019-01-05 MED ORDER — LEVOTHYROXINE SODIUM 75 MCG PO TABS: 75.0000 ug | ORAL_TABLET | Freq: Every day | ORAL | Status: DC

## 2019-01-05 MED ORDER — INSULIN ASPART 100 UNIT/ML ~~LOC~~ SOLN
0.0000 [IU] | SUBCUTANEOUS | Status: DC
  Administered 2019-01-05 (×2): 3 [IU] via SUBCUTANEOUS
  Administered 2019-01-05 – 2019-01-07 (×6): 2 [IU] via SUBCUTANEOUS
  Administered 2019-01-07: 3 [IU] via SUBCUTANEOUS
  Administered 2019-01-07 (×2): 2 [IU] via SUBCUTANEOUS
  Administered 2019-01-07: 3 [IU] via SUBCUTANEOUS
  Administered 2019-01-08: 2 [IU] via SUBCUTANEOUS
  Administered 2019-01-08: 5 [IU] via SUBCUTANEOUS
  Administered 2019-01-09: 8 [IU] via SUBCUTANEOUS
  Administered 2019-01-09: 5 [IU] via SUBCUTANEOUS
  Administered 2019-01-09: 8 [IU] via SUBCUTANEOUS
  Administered 2019-01-09 – 2019-01-10 (×4): 5 [IU] via SUBCUTANEOUS
  Administered 2019-01-10: 11 [IU] via SUBCUTANEOUS
  Administered 2019-01-10: 5 [IU] via SUBCUTANEOUS
  Administered 2019-01-10 (×3): 8 [IU] via SUBCUTANEOUS
  Administered 2019-01-11: 5 [IU] via SUBCUTANEOUS
  Administered 2019-01-11: 11 [IU] via SUBCUTANEOUS
  Administered 2019-01-11: 5 [IU] via SUBCUTANEOUS
  Administered 2019-01-11: 8 [IU] via SUBCUTANEOUS
  Administered 2019-01-11: 3 [IU] via SUBCUTANEOUS
  Administered 2019-01-11: 5 [IU] via SUBCUTANEOUS
  Administered 2019-01-12: 2 [IU] via SUBCUTANEOUS
  Administered 2019-01-12: 3 [IU] via SUBCUTANEOUS
  Administered 2019-01-12: 5 [IU] via SUBCUTANEOUS
  Administered 2019-01-12: 3 [IU] via SUBCUTANEOUS
  Administered 2019-01-12: 5 [IU] via SUBCUTANEOUS
  Administered 2019-01-12: 3 [IU] via SUBCUTANEOUS
  Administered 2019-01-13: 2 [IU] via SUBCUTANEOUS
  Administered 2019-01-13: 3 [IU] via SUBCUTANEOUS
  Administered 2019-01-13: 2 [IU] via SUBCUTANEOUS
  Administered 2019-01-13: 5 [IU] via SUBCUTANEOUS
  Administered 2019-01-14: 3 [IU] via SUBCUTANEOUS

## 2019-01-05 MED ORDER — FLUTICASONE PROPIONATE 50 MCG/ACT NA SUSP
2.0000 | Freq: Every day | NASAL | Status: DC
  Administered 2019-01-07 – 2019-01-15 (×7): 2 via NASAL
  Filled 2019-01-05: qty 16

## 2019-01-05 MED ORDER — IOPAMIDOL (ISOVUE-370) INJECTION 76%
50.0000 mL | Freq: Once | INTRAVENOUS | Status: AC | PRN
  Administered 2019-01-05: 50 mL via INTRAVENOUS

## 2019-01-05 MED ORDER — ROCURONIUM BROMIDE 50 MG/5ML IV SOSY
PREFILLED_SYRINGE | INTRAVENOUS | Status: DC | PRN
  Administered 2019-01-05: 15 mg via INTRAVENOUS
  Administered 2019-01-05: 50 mg via INTRAVENOUS

## 2019-01-05 MED ORDER — ONDANSETRON HCL 4 MG/2ML IJ SOLN
INTRAMUSCULAR | Status: DC | PRN
  Administered 2019-01-05: 4 mg via INTRAVENOUS

## 2019-01-05 MED ORDER — VANCOMYCIN HCL 1000 MG IV SOLR
INTRAVENOUS | Status: DC | PRN
  Administered 2019-01-05: 1000 mg via INTRAVENOUS

## 2019-01-05 MED ORDER — POTASSIUM CHLORIDE CRYS ER 10 MEQ PO TBCR
10.0000 meq | EXTENDED_RELEASE_TABLET | Freq: Every day | ORAL | Status: DC
  Administered 2019-01-08: 10 meq via ORAL
  Filled 2019-01-05 (×3): qty 1

## 2019-01-05 MED ORDER — LEVOTHYROXINE SODIUM 100 MCG/5ML IV SOLN
37.5000 ug | Freq: Every day | INTRAVENOUS | Status: DC
  Administered 2019-01-05 – 2019-01-06 (×2): 37.5 ug via INTRAVENOUS
  Filled 2019-01-05 (×4): qty 5

## 2019-01-05 MED ORDER — IOHEXOL 300 MG/ML  SOLN: 144.0000 mL | Freq: Once | INTRAMUSCULAR | Status: DC | PRN

## 2019-01-05 MED ORDER — SENNOSIDES-DOCUSATE SODIUM 8.6-50 MG PO TABS: 1.0000 | ORAL_TABLET | Freq: Every evening | ORAL | Status: DC | PRN

## 2019-01-05 MED ORDER — TICAGRELOR 90 MG PO TABS
ORAL_TABLET | ORAL | Status: AC
  Filled 2019-01-05: qty 2

## 2019-01-05 MED ORDER — FUROSEMIDE 10 MG/ML IJ SOLN
20.0000 mg | INTRAMUSCULAR | Status: DC
  Administered 2019-01-06: 20 mg via INTRAVENOUS
  Filled 2019-01-05: qty 2

## 2019-01-05 MED ORDER — STROKE: EARLY STAGES OF RECOVERY BOOK
Freq: Once | Status: AC
  Administered 2019-01-05: 17:00:00
  Filled 2019-01-05: qty 1

## 2019-01-05 MED ORDER — CLEVIDIPINE BUTYRATE 0.5 MG/ML IV EMUL: 0.0000 mg/h | INTRAVENOUS | Status: DC

## 2019-01-05 MED ORDER — ACETAMINOPHEN 650 MG RE SUPP: 650.0000 mg | RECTAL | Status: DC | PRN

## 2019-01-05 MED ORDER — SODIUM CHLORIDE 0.9 % IV SOLN
INTRAVENOUS | Status: DC
  Administered 2019-01-05 (×2): via INTRAVENOUS

## 2019-01-05 MED ORDER — PANTOPRAZOLE SODIUM 40 MG IV SOLR
40.0000 mg | INTRAVENOUS | Status: DC
  Administered 2019-01-05 – 2019-01-07 (×3): 40 mg via INTRAVENOUS
  Filled 2019-01-05 (×3): qty 40

## 2019-01-05 MED ORDER — EPTIFIBATIDE 20 MG/10ML IV SOLN
INTRAVENOUS | Status: AC
  Filled 2019-01-05: qty 10

## 2019-01-05 MED ORDER — LIDOCAINE 2% (20 MG/ML) 5 ML SYRINGE
INTRAMUSCULAR | Status: DC | PRN
  Administered 2019-01-05: 60 mg via INTRAVENOUS

## 2019-01-05 MED ORDER — VANCOMYCIN HCL IN DEXTROSE 1-5 GM/200ML-% IV SOLN
INTRAVENOUS | Status: AC
  Filled 2019-01-05: qty 200

## 2019-01-05 MED ORDER — LIDOCAINE HCL 1 % IJ SOLN
INTRAMUSCULAR | Status: AC
  Filled 2019-01-05: qty 20

## 2019-01-05 NOTE — Progress Notes (Signed)
Patient ID: Stacy [name], female   DOB: 06-22-1936, 83 y.o.   MRN: [id number] Post procedure CT Brain No ICH or mass effect noted. RT groin  BF angioseal used for hemostasis. Distal pulses DPs and PT s palpable bilaterally. Patient  underwent x 3 mechanical thrombectomies with reocclusion and x 2 balloon angioplasties of site of occlusion probably due to underlying arteriosclerosis., also resulting in reocclusion after  afew mins. Endovascular stent was considered but nor performed because of the potential for  increased  .risk of ICH and worsening of the subdural with the need for dual antiplatelets for stent protection, and patient already on warfarin. S.Amro Winebarger MD

## 2019-01-05 NOTE — Progress Notes (Signed)
  Echocardiogram 2D Echocardiogram has been performed.  Bobbye Charleston 01/05/2019, 2:52 PM

## 2019-01-05 NOTE — ED Notes (Signed)
Daughter phone number: [phone]

## 2019-01-05 NOTE — ED Notes (Signed)
Per EMS run sheet- EMS call went out at 0352. Dr. Cheral Marker aware.

## 2019-01-05 NOTE — ED Notes (Signed)
Patient transported to CT 

## 2019-01-05 NOTE — ED Triage Notes (Addendum)
Pt arrives from home where husband called 911 after pt fell off of the commode- pt states she remembers going to bed at 8pm and then she woke her husband up sometime just prior  To ems being called- pt was able to walk to the RR and when trying to get up pt fell- Ems reports she had garbled speech and mild facial droop-On arrival to ED pts clear is clear seems to have delayed responses to questions and difficulty finishing sentences.   Pt arrives with IV in left arm started by ems. Pt on blood thinners

## 2019-01-05 NOTE — Sedation Documentation (Signed)
Deveshwar MD arrived in IR suite 2

## 2019-01-05 NOTE — Progress Notes (Signed)
Patient ID: Stacy [name], female   DOB: [birth year], 83 y.o.   MRN: [id number] INR.  26 YF LSW  At 350 am todaty noted to have  RT facial weakness  And dysarthria.  mRS 0 to 1.Patient on warfarin with INR odf 1.59. CT brain No ICH ASPECTS 10. CTA occluded Lt MCA M1 seg . Endovascular revascularization D/W daughter and husband .Reasons risks reviewed. Risk of hemorrhage of 10 % reviewed.Alternatives discussed. Both understood planned treatment. Questions answered to their satisfaction. Informed witnessed consent obtained. S.Orvis Stann MD

## 2019-01-05 NOTE — Transfer of Care (Addendum)
Immediate Anesthesia Transfer of Care Note  Patient: Stacy [name]  Procedure(s) Performed: IR WITH ANESTHESIA (N/A )  Patient Location: PACU  Anesthesia Type:General  Level of Consciousness:  - stoke symptoms worse than before anesthesia; patinent now not moving right side and more pronounced facial droop. She is responding  To voice, but will only moan when asked questions. Airway is patent and VSS.   Airway & Oxygen Therapy: Patient Spontanous Breathing and Patient connected to nasal cannula oxygen  Post-op Assessment: Report given to RN and Post -op Vital signs reviewed and stable  Post vital signs: Reviewed and stable  Last Vitals:  Vitals Value Taken Time  BP 90/47 01/05/2019 12:08 PM  Temp    Pulse 79 01/05/2019 12:11 PM  Resp 15 01/05/2019 12:11 PM  SpO2 98 % 01/05/2019 12:11 PM  Vitals shown include unvalidated device data.  Last Pain:  Vitals:   01/05/19 0510  TempSrc: Oral  PainSc: 0-No pain         Complications: No apparent anesthesia complications

## 2019-01-05 NOTE — Progress Notes (Signed)
Went to evaluate patient in neuro ICU with Dr. Estanislado Pandy. Patient underwent an emergent image-guided cerebral arteriogram with mechanical thrombectomy and balloon angioplasty x2 of her left MCA M1 segment occlusion achieving a TICI 2 revascularization with reocclusion today with Dr. Estanislado Pandy.  Patient awake and alert laying in bed. Accompanied by husband, son, and pastor at bedside.  Alert and awake but can follow some simple commands. Demonstrates expressive aphasia. PERRL bilaterally. EOMs intact bilaterally without nystagmus or subjective diplopia. Visual fields not assessed. No facial asymmetry. Unable to protrude tongue. Can spontaneously move left side but has minimal movements of right side (can lift arm off bed slightly, can move right foot slightly). Pronator drift not assessed. Fine motor and coordination not assessed. Gait not assessed. Romberg not assessed. Heel to toe not assessed. Distal pulses 2+ bilaterally. Right groin incision soft without active bleeding or hematoma. Left groin incision soft with a-line intact, no active bleeding or hematoma.  Patient's condition stable. Plans per stroke team, appreciate and agree with neurology and CCM management. IR to follow.  Bea Graff Kamira Mellette, PA-C 01/05/2019, 4:11 PM

## 2019-01-05 NOTE — ED Notes (Signed)
ED Provider at bedside. 

## 2019-01-05 NOTE — ED Provider Notes (Signed)
[place] [facility] EMERGENCY DEPARTMENT Provider Note   CSN: [id number] Arrival date & time: 01/05/19  0500     History   Chief Complaint Chief Complaint  Patient presents with  . Fall    HPI Stacy [name] is a 83 y.o. female.  The history is provided by the patient.  Fall   She has history of diabetes, hyperlipidemia, hypothyroidism, breast cancer, paroxysmal atrial fibrillation anticoagulated on warfarin and comes in by ambulance after an apparent fall.  Patient states that she had used the bathroom and the next thing she knew she was on the floor with EMS there.  EMS reports initial right facial droop and speech difficulty.  She was not completing sentence and was having difficulty finding words.  This has resolved by the time she arrived in the ED.  She denies injury in the fall.  Past Medical History:  Diagnosis Date  . Arthritis   . Atherosclerosis of aorta ([place])   . Breast cancer ([place]) 2008   mastectomy left  . Cancer ([place])    breast  . Diabetes mellitus without complication ([place])   . Dysrhythmia    atrial tachycardia  . Dysrhythmia    PSVT  . Hyperlipidemia   . Hypothyroidism   . Sensorimotor neuropathy     There are no active problems to display for this patient.   Past Surgical History:  Procedure Laterality Date  . ABLATION     cardiac  . BREAST BIOPSY Right 04/30/2016   path pending  . BREAST SURGERY    . ELECTROPHYSIOLOGIC STUDY N/A 06/22/2015   Procedure: Cardioversion;  Surgeon: Yolonda Kida, MD;  Location: [facility] ORS;  Service: Cardiovascular;  Laterality: N/A;  . ELECTROPHYSIOLOGIC STUDY N/A 04/09/2016   Procedure: Cardioversion;  Surgeon: Yolonda Kida, MD;  Location: [facility] ORS;  Service: Cardiovascular;  Laterality: N/A;  . ESOPHAGOGASTRODUODENOSCOPY (EGD) WITH PROPOFOL N/A 12/11/2016   Procedure: ESOPHAGOGASTRODUODENOSCOPY (EGD) WITH PROPOFOL;  Surgeon: Manya Silvas, MD;  Location: [facility] ENDOSCOPY;  Service: Endoscopy;   Laterality: N/A;  . MASTECTOMY Left 2008     OB History   No obstetric history on file.      Home Medications    Prior to Admission medications   Medication Sig Start Date End Date Taking? Authorizing Provider  Ascorbic Acid (VITAMIN C) 1000 MG tablet Take 1,000 mg by mouth daily.    [provider]  atorvastatin (LIPITOR) 20 MG tablet Take 20 mg by mouth daily.    [provider]  azelastine (ASTELIN) 0.1 % nasal spray Place 2 sprays into both nostrils 2 (two) times daily. Use in each nostril as directed    [provider]  calcium citrate-vitamin D (CITRACAL+D) 315-200 MG-UNIT per tablet Take 1 tablet by mouth 2 (two) times daily.    [provider]  cholecalciferol (VITAMIN D) 1000 UNITS tablet Take 2,000 Units by mouth daily.    [provider]  fluticasone (FLONASE) 50 MCG/ACT nasal spray Place 2 sprays into both nostrils daily.    [provider]  furosemide (LASIX) 40 MG tablet Take 20 mg by mouth 3 (three) times a week.     [provider]  gabapentin (NEURONTIN) 300 MG capsule Take 300 mg by mouth 3 (three) times daily.    [provider]  glucose blood test strip 1 each by Other route 3 (three) times daily. Use as instructed    [provider]  insulin glargine (LANTUS) 100 UNIT/ML injection Inject 10  Units into the skin 2 (two) times daily.    [provider]  levothyroxine (SYNTHROID, LEVOTHROID) 75 MCG tablet Take 75 mcg by mouth daily before breakfast.    [provider]  loperamide (IMODIUM) 2 MG capsule Take 2 mg by mouth as needed for diarrhea or loose stools.    [provider]  magnesium oxide (MAG-OX) 400 MG tablet Take 400 mg by mouth daily.    [provider]  metoprolol succinate (TOPROL-XL) 50 MG 24 hr tablet Take 50 mg by mouth 2 (two) times daily.     [provider]  Multiple Vitamins-Minerals (MULTIVITAMIN WITH MINERALS) tablet Take 1  tablet by mouth daily.    [provider]  omega-3 acid ethyl esters (LOVAZA) 1 G capsule Take 1 g by mouth 2 (two) times daily.    [provider]  omeprazole (PRILOSEC) 40 MG capsule Take 40 mg by mouth 2 (two) times daily.    [provider]  polyethylene glycol (MIRALAX / GLYCOLAX) packet Take 17 g by mouth daily.    [provider]  potassium chloride (KLOR-CON) 8 MEQ tablet Take 10 mEq by mouth daily.    [provider]  sucralfate (CARAFATE) 1 g tablet Take 1 g by mouth 4 (four) times daily -  with meals and at bedtime.    [provider]  vitamin E 400 UNIT capsule Take 400 Units by mouth daily.    [provider]  warfarin (COUMADIN) 5 MG tablet Take 2.5-5 mg by mouth daily. 5MG -Wednesday AND Friday 2.5MG -ALL OTHER DAYS    [provider]    Family History Family History  Problem Relation Age of Onset  . Breast cancer Cousin     Social History Social History   Tobacco Use  . Smoking status: Never Smoker  . Smokeless tobacco: Never Used  Substance Use Topics  . Alcohol use: No  . Drug use: No     Allergies   Morphine and related; Amiodarone; Codeine; Lovaza [omega-3-acid ethyl esters]; Zaroxolyn [metolazone]; Ivp dye [iodinated diagnostic agents]; Penicillins; and Sulfa antibiotics   Review of Systems Review of Systems  All other systems reviewed and are negative.    Physical Exam Updated Vital Signs BP 135/66 (BP Location: Right Arm)   Pulse 71   Temp 98 F (36.7 C) (Oral)   Resp 16   SpO2 100%   Physical Exam Vitals signs and nursing note reviewed.    83 year old female, resting comfortably and in no acute distress. Vital signs are normal. Oxygen saturation is 100%, which is normal. Head is normocephalic and atraumatic. PERRLA, EOMI. Oropharynx is clear. Neck is nontender and supple without adenopathy or JVD.  There are no carotid bruits. Back is nontender and there is no CVA  tenderness. Lungs are clear without rales, wheezes, or rhonchi. Chest: Surgical scar from pacemaker placement present left subclavian area.  There is slight ecchymosis there and this area is tender with some tenderness over the left clavicle as well.  No swelling or deformity noted. Heart has regular rate and rhythm without murmur. Abdomen is soft, flat, nontender without masses or hepatosplenomegaly and peristalsis is normoactive. Extremities have no cyanosis or edema, full range of motion is present. Skin is warm and dry without rash. Neurologic: Mental status is normal, cranial nerves are intact, there are no motor or sensory deficits.  ED Treatments / Results  Labs (all labs ordered are listed, but only abnormal results are displayed) Labs Reviewed  PROTIME-INR - Abnormal; Notable for the following components:      Result Value   Prothrombin Time 18.8 (*)    All other components within normal limits  CBC - Abnormal; Notable for the following components:   RBC 3.35 (*)    Hemoglobin 10.1 (*)    HCT 32.6 (*)    All other components within normal limits  COMPREHENSIVE METABOLIC PANEL - Abnormal; Notable for the following components:   Glucose, Bld 112 (*)    Creatinine, Ser 1.01 (*)    Total Protein 6.0 (*)    Albumin 3.2 (*)    AST 47 (*)    ALT 175 (*)    Total Bilirubin 1.4 (*)    GFR calc non Af Amer 53 (*)    All other components within normal limits  TROPONIN I - Abnormal; Notable for the following components:   Troponin I 0.03 (*)    All other components within normal limits  ETHANOL  APTT  DIFFERENTIAL  RAPID URINE DRUG SCREEN, HOSP PERFORMED  URINALYSIS, ROUTINE W REFLEX MICROSCOPIC    EKG EKG Interpretation  Date/Time:  Tuesday January 05 2019 05:04:26 EST Ventricular Rate:  72 PR Interval:    QRS Duration: 147 QT Interval:  575 QTC Calculation: 630 R Axis:   -76 Text Interpretation:  VENTRICULAR PACED RHYTHM Underlying  Atrial flutter When compared with  ECG of 09/02/2018, VENTRICULAR PACED RHYTHM is now present Confirmed by Delora Fuel ([id number]) on 01/05/2019 6:50:58 AM   Radiology Ct Angio Head W Or Wo Contrast  Result Date: 01/05/2019 CLINICAL DATA:  Focal neuro deficit with stroke suspected EXAM: CT ANGIOGRAPHY HEAD AND NECK TECHNIQUE: Multidetector CT imaging of the head and neck was performed using the standard protocol during bolus administration of intravenous contrast. Multiplanar CT image reconstructions and MIPs were obtained to evaluate the vascular anatomy. Carotid stenosis measurements (when applicable) are obtained utilizing NASCET criteria, using the distal internal carotid diameter as the denominator. CONTRAST:  57mL ISOVUE-370 IOPAMIDOL (ISOVUE-370) INJECTION 76% COMPARISON:  Noncontrast head CT from earlier today FINDINGS: CTA NECK FINDINGS Aortic arch: Atherosclerosis.  Three vessel branching. Right carotid system: Mild atherosclerotic plaque. No stenosis or ulceration. Left carotid system: Mild atherosclerotic plaque. No stenosis or ulceration. Vertebral arteries: Proximal subclavian atherosclerosis without flow limiting stenosis. A tubal tortuosity. Both vertebral arteries are widely patent to the dura. Skeleton: Negative Other neck: No acute finding. Upper chest: Layering right pleural effusion. Review of the MIP images confirms the above findings CTA HEAD FINDINGS Anterior circulation: Left M1 to M2 occlusion with acute appearance causing a proximal meniscus sign. There is downstream under filled reconstitution vessels. Atherosclerotic calcification of the carotid siphons. Posterior circulation: Symmetric vertebral arteries. The vertebral and basilar arteries are smooth and widely patent. No branch occlusion or flow limiting stenosis. Venous sinuses: Patent as permitted by contrast timing Anatomic variants: None significant. Delayed phase: No abnormal intracranial enhancement. Known mixed density subdural hematoma along the left cerebral  convexity. Review of the MIP images confirms the above findings Critical Value/emergent results were called by telephone at the time of interpretation on 01/05/2019 at 7:53 am to Dr. Delora Fuel , who verbally acknowledged these results. IMPRESSION: 1. 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 There is underfilling of the reconstituted downstream vessels. 2. Mixed density subdural hematoma along the left cerebral convexity, see preceding noncontrast head CT. 3. Limited atherosclerosis for age. No flow limiting stenosis or ulceration. Electronically Signed   By: Neva Seat.D.  On: 01/05/2019 07:59   Dg Chest 2 View  Result Date: 01/05/2019 CLINICAL DATA:  Initial evaluation for acute trauma, fall. EXAM: CHEST - 2 VIEW COMPARISON:  Prior radiograph from 09/02/2018 FINDINGS: There has been interval placement of a triple lead transvenous pacemaker/AICD. Cardiomegaly is relatively stable. Mediastinal silhouette within normal limits. Aortic atherosclerosis. Lungs hypoinflated. No focal infiltrates. No pulmonary edema. Small layering right pleural effusion seen posteriorly on lateral projection. No pneumothorax. No acute osseous finding. Multiple surgical clips overlie the left axilla. IMPRESSION: 1. Small layering right pleural effusion. 2. No other active cardiopulmonary disease. Electronically Signed   By: Jeannine Boga M.D.   On: 01/05/2019 06:15   Dg Clavicle Left  Result Date: 01/05/2019 CLINICAL DATA:  Initial evaluation for acute left clavicular pain status post fall. EXAM: LEFT CLAVICLE - 2+ VIEWS COMPARISON:  None. FINDINGS: No acute fracture or dislocation. Osteoarthritic changes present at the left [facility] joint. Pacemaker electrodes partially overlie the clavicle. Visualized left lung apex clear. No soft tissue abnormality. IMPRESSION: No acute osseous abnormality about the left clavicle. Electronically Signed   By: Jeannine Boga M.D.   On: 01/05/2019 06:17   Ct Head Wo  Contrast  Result Date: 01/05/2019 CLINICAL DATA:  83 year old female fell off toilet tonight. Garbled speech and mild facial droop. Initial encounter. EXAM: CT HEAD WITHOUT CONTRAST CT CERVICAL SPINE WITHOUT CONTRAST TECHNIQUE: Multidetector CT imaging of the head and cervical spine was performed following the standard protocol without intravenous contrast. Multiplanar CT image reconstructions of the cervical spine were also generated. COMPARISON:  None. FINDINGS: CT HEAD FINDINGS Brain: Complex broad-base left convexity subdural hematoma. Largest component appears hypodense (possibly chronic) measuring 10.1 mm maximal thickness. More acute appearing component (which is slightly hyperdense) measures up to 4.3 mm. Mild local mass effect upon adjacent frontal-parietal gyri. Minimal bowing of the septum to the right. Thin left tentorial subdural hematoma. Tubular hyperdensity along the course of the left middle cerebral artery M1 segment may be related to thrombus or possibly tiny amount of blood. Chronic microvascular changes. Global atrophy. Vascular: As above. Skull: No skull fracture noted. Sinuses/Orbits: No acute orbital abnormality. Visualized paranasal sinuses are clear. Other: Mastoid air cells and middle ear cavities are clear. CT CERVICAL SPINE FINDINGS Alignment: Slight scoliosis convex left. Minimal anterior slip C4 secondary to right-sided facet degenerative changes. Skull base and vertebrae: No cervical spine fracture. Nonspecific 7 mm right C4 and C6 vertebral body lucency Soft tissues and spinal canal: No abnormal prevertebral soft tissue swelling. Disc levels: Scattered cervical spondylotic changes, most notable C6-7. No high-grade spinal stenosis. Upper chest: No worrisome abnormality Other: No worrisome abnormality IMPRESSION: CT HEAD: 1. Complex broad-base left convexity subdural hematoma. Largest component appears hypodense (possibly chronic) measuring 10.1 mm maximal thickness. More acute  appearing component (which is slightly hyperdense) measures up to 4.3 mm. Mild local mass effect upon adjacent frontal-parietal gyri. Minimal bowing of the septum to the right. No skull fracture. 2. Thin left tentorial subdural hematoma. 3. Tubular hyperdensity along the course of the left middle cerebral artery M1 segment may be related to thrombus or possibly tiny amount of blood. 4. Chronic microvascular changes. 5. Global atrophy. CT CERVICAL SPINE: 1. Slight scoliosis convex left. Minimal anterior slip C4 secondary to right-sided facet degenerative changes. 2. No cervical spine fracture noted. No abnormal prevertebral soft tissue swelling. 3. Nonspecific 7 mm right C4 and C6 vertebral body lucency. These results were called by telephone at the time of interpretation on 01/05/2019 at 6:01 am  to Dr. Delora Fuel , who verbally acknowledged these results. Electronically Signed   By: Genia Del M.D.   On: 01/05/2019 06:19   Ct Angio Neck W And/or Wo Contrast  Result Date: 01/05/2019 CLINICAL DATA:  Focal neuro deficit with stroke suspected EXAM: CT ANGIOGRAPHY HEAD AND NECK TECHNIQUE: Multidetector CT imaging of the head and neck was performed using the standard protocol during bolus administration of intravenous contrast. Multiplanar CT image reconstructions and MIPs were obtained to evaluate the vascular anatomy. Carotid stenosis measurements (when applicable) are obtained utilizing NASCET criteria, using the distal internal carotid diameter as the denominator. CONTRAST:  23mL ISOVUE-370 IOPAMIDOL (ISOVUE-370) INJECTION 76% COMPARISON:  Noncontrast head CT from earlier today FINDINGS: CTA NECK FINDINGS Aortic arch: Atherosclerosis.  Three vessel branching. Right carotid system: Mild atherosclerotic plaque. No stenosis or ulceration. Left carotid system: Mild atherosclerotic plaque. No stenosis or ulceration. Vertebral arteries: Proximal subclavian atherosclerosis without flow limiting stenosis. A tubal  tortuosity. Both vertebral arteries are widely patent to the dura. Skeleton: Negative Other neck: No acute finding. Upper chest: Layering right pleural effusion. Review of the MIP images confirms the above findings CTA HEAD FINDINGS Anterior circulation: Left M1 to M2 occlusion with acute appearance causing a proximal meniscus sign. There is downstream under filled reconstitution vessels. Atherosclerotic calcification of the carotid siphons. Posterior circulation: Symmetric vertebral arteries. The vertebral and basilar arteries are smooth and widely patent. No branch occlusion or flow limiting stenosis. Venous sinuses: Patent as permitted by contrast timing Anatomic variants: None significant. Delayed phase: No abnormal intracranial enhancement. Known mixed density subdural hematoma along the left cerebral convexity. Review of the MIP images confirms the above findings Critical Value/emergent results were called by telephone at the time of interpretation on 01/05/2019 at 7:53 am to Dr. Delora Fuel , who verbally acknowledged these results. IMPRESSION: 1. 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 There is underfilling of the reconstituted downstream vessels. 2. Mixed density subdural hematoma along the left cerebral convexity, see preceding noncontrast head CT. 3. Limited atherosclerosis for age. No flow limiting stenosis or ulceration. Electronically Signed   By: Monte Fantasia M.D.   On: 01/05/2019 07:59   Ct Cervical Spine Wo Contrast  Result Date: 01/05/2019 CLINICAL DATA:  83 year old female fell off toilet tonight. Garbled speech and mild facial droop. Initial encounter. EXAM: CT HEAD WITHOUT CONTRAST CT CERVICAL SPINE WITHOUT CONTRAST TECHNIQUE: Multidetector CT imaging of the head and cervical spine was performed following the standard protocol without intravenous contrast. Multiplanar CT image reconstructions of the cervical spine were also generated. COMPARISON:  None. FINDINGS: CT HEAD  FINDINGS Brain: Complex broad-base left convexity subdural hematoma. Largest component appears hypodense (possibly chronic) measuring 10.1 mm maximal thickness. More acute appearing component (which is slightly hyperdense) measures up to 4.3 mm. Mild local mass effect upon adjacent frontal-parietal gyri. Minimal bowing of the septum to the right. Thin left tentorial subdural hematoma. Tubular hyperdensity along the course of the left middle cerebral artery M1 segment may be related to thrombus or possibly tiny amount of blood. Chronic microvascular changes. Global atrophy. Vascular: As above. Skull: No skull fracture noted. Sinuses/Orbits: No acute orbital abnormality. Visualized paranasal sinuses are clear. Other: Mastoid air cells and middle ear cavities are clear. CT CERVICAL SPINE FINDINGS Alignment: Slight scoliosis convex left. Minimal anterior slip C4 secondary to right-sided facet degenerative changes. Skull base and vertebrae: No cervical spine fracture. Nonspecific 7 mm right C4 and C6 vertebral body lucency Soft tissues and spinal canal: No abnormal  prevertebral soft tissue swelling. Disc levels: Scattered cervical spondylotic changes, most notable C6-7. No high-grade spinal stenosis. Upper chest: No worrisome abnormality Other: No worrisome abnormality IMPRESSION: CT HEAD: 1. Complex broad-base left convexity subdural hematoma. Largest component appears hypodense (possibly chronic) measuring 10.1 mm maximal thickness. More acute appearing component (which is slightly hyperdense) measures up to 4.3 mm. Mild local mass effect upon adjacent frontal-parietal gyri. Minimal bowing of the septum to the right. No skull fracture. 2. Thin left tentorial subdural hematoma. 3. Tubular hyperdensity along the course of the left middle cerebral artery M1 segment may be related to thrombus or possibly tiny amount of blood. 4. Chronic microvascular changes. 5. Global atrophy. CT CERVICAL SPINE: 1. Slight scoliosis convex  left. Minimal anterior slip C4 secondary to right-sided facet degenerative changes. 2. No cervical spine fracture noted. No abnormal prevertebral soft tissue swelling. 3. Nonspecific 7 mm right C4 and C6 vertebral body lucency. These results were called by telephone at the time of interpretation on 01/05/2019 at 6:01 am to Dr. Delora Fuel , who verbally acknowledged these results. Electronically Signed   By: Genia Del M.D.   On: 01/05/2019 06:19    Procedures Procedures  CRITICAL CARE Performed by: Delora Fuel Total critical care time: 50 minutes Critical care time was exclusive of separately billable procedures and treating other patients. Critical care was necessary to treat or prevent imminent or life-threatening deterioration. Critical care was time spent personally by me on the following activities: development of treatment plan with patient and/or surrogate as well as nursing, discussions with consultants, evaluation of patient's response to treatment, examination of patient, obtaining history from patient or surrogate, ordering and performing treatments and interventions, ordering and review of laboratory studies, ordering and review of radiographic studies, pulse oximetry and re-evaluation of patient's condition.  Medications Ordered in ED Medications  iopamidol (ISOVUE-370) 76 % injection 100 mL (has no administration in time range)  diphenhydrAMINE (BENADRYL) injection 50 mg (50 mg Intravenous Given 01/05/19 0642)  hydrocortisone sodium succinate (SOLU-CORTEF) injection 200 mg (200 mg Intravenous Given 01/05/19 0642)  iopamidol (ISOVUE-370) 76 % injection 50 mL (50 mLs Intravenous Contrast Given 01/05/19 0748)     Initial Impression / Assessment and Plan / ED Course  I have reviewed the triage vital signs and the nursing notes.  Pertinent labs & imaging results that were available during my care of the patient were reviewed by me and considered in my medical decision making (see  chart for details).  Fall at home without significant injury.  Old records are reviewed, and pacemaker was placed on June 8 with complication of cardiac tamponade following insertion.  Initial symptoms suggesting stroke which have resolved.  Probable transient ischemic attack.  She will be sent for CT of head and cervical spine and x-rays will be obtained of chest and left clavicle.  Anticipate need for hospital admission to complete TIA work-up.  Family has arrived and states patient has been having fairly frequent falls.  CT shows probable acute on chronic left-sided subdural hematoma, question of thrombus in M1 segment of left middle cerebral artery.  Discussion was held with radiologist who felt in light of recent pacemaker insertion, CT angiogram would be appropriate.  She has a report of rash from IVP dye.  Patient did have CT of abdomen and pelvis with IV contrast in 2017.  Patient states she had no rash related to that.  I reviewed the record from that and does not appear that she was premedicated.  Family also states that her speech is not normal.  It is noted that her speech is somewhat slower than when I initially saw her but there is still no dysarthria or facial droop.  CT angiogram is ordered.  CT angiogram does show what is probably an embolism in the M1 branch of the left middle cerebral artery.  Code stroke is activated.  Case is discussed with Dr. Cheral Marker of neurology service is coming to evaluate the patient for possible interventional radiology.  INR is subtherapeutic, which would account for developing an embolism.  Remainder of labs do show elevation of transaminases of uncertain cause, mild anemia which is unchanged from baseline.  ECG shows ventricular paced rhythm which is new, consistent with known history of pacemaker insertion 13 days ago.  Chest x-ray and left clavicle x-rays showed no acute process.  Neurosurgery will be consulted regarding the subdural hematoma.  Final Clinical  Impressions(s) / ED Diagnoses   Final diagnoses:  Cerebrovascular accident (CVA) due to embolism of left middle cerebral artery ([place])  Subdural hematoma without coma, without loss of consciousness, initial encounter ([place])  Elevated transaminase level  Normochromic normocytic anemia  Fall at home, initial encounter  Multiple falls    ED Discharge Orders    None       Delora Fuel, MD 74/14/23 [phone]

## 2019-01-05 NOTE — ED Notes (Signed)
Family at bedside. 

## 2019-01-05 NOTE — Anesthesia Postprocedure Evaluation (Signed)
Anesthesia Post Note  Patient: Stacy [name]  Procedure(s) Performed: IR WITH ANESTHESIA (N/A )     Patient location during evaluation: PACU Anesthesia Type: General Level of consciousness: awake and alert Pain management: pain level controlled Vital Signs Assessment: post-procedure vital signs reviewed and stable Respiratory status: spontaneous breathing, nonlabored ventilation and respiratory function stable Cardiovascular status: blood pressure returned to baseline and stable Postop Assessment: no apparent nausea or vomiting Anesthetic complications: no    Last Vitals:  Vitals:   01/05/19 1415 01/05/19 1430  BP: (!) 111/54 (!) 113/54  Pulse: 80 78  Resp: 16 18  Temp:    SpO2: 99% 99%    Last Pain:  Vitals:   01/05/19 1237  TempSrc:   PainSc: Asleep                 Lusia Greis,W. EDMOND

## 2019-01-05 NOTE — Sedation Documentation (Signed)
Patient under care of CRNA is IR suite 2. This RN in room to assist as needed

## 2019-01-05 NOTE — Procedures (Signed)
S/P Lt common carotid arteriogram followed endovascular revascularization of Lt MCA M1 occlusion withx 2 passes with embotrap 64mm x 33 mm retriever and x 1 pass with the solitaire 74mm x 40 mm rertrieve achievinf a TICI 2b with reocclusion requiring x 2 balloon angioplasties with TICI 2 revascularization with reocclusion.

## 2019-01-05 NOTE — Consult Note (Signed)
NAME:  Stacy [name], MRN:  [id number], DOB:  1936/07/30, LOS: 0 ADMISSION DATE:  01/05/2019, CONSULTATION DATE:  1/21 REFERRING MD:  , CHIEF COMPLAINT:  CVA  Brief History   83yo F admitted with acute CVA s/p revascularization.   History of present illness   83 yo F with PMH diabetes, recent pericardial effusion s/p pacemaker placement, Afib subtherapeutic on home warfarin, breast cancer, hypothyroidism, DM, HLD presented to [facility] as code stroke. Exact last known normal unknown and has been best estimated as 3:50am. EMS called at 3:52 AM.  Patient underwent mechanical thrombectomy x3, with subsequent reocclusion, and balloon angioplasties x2 with subsequent reocclusion.  PCCM has been consulted for medical management assistance.  Past Medical History  DM HLD HTN Breast cancer A Fib on home warfarin Pericardial effusion   Significant Hospital Events   1/21> reocclusion of vessels s/p thrombectomy x3 and balloon angioplasty x 2 1/21> admitted to neuro ICU   Consults:  PCCM  Procedures:  1/21> thrombectomy x 3 with reocclusion and balloon angio x 2 with reocclusion  Significant Diagnostic Tests:  CT head 1/21> multiple L SDHs. M1 thrombus CT angio head 1/21> L M1 M2 occlusion  Micro Data:    Antimicrobials:    Interim history/subjective:  Admitted to neuro ICU s/p CVA with unsuccessful NIR intervention   Objective   Blood pressure (!) 110/50, pulse 83, temperature 97.7 F (36.5 C), resp. rate 19, SpO2 100 %.        Intake/Output Summary (Last 24 hours) at 01/05/2019 1414 Last data filed at 01/05/2019 1237 Gross per 24 hour  Intake 600 ml  Output 30 ml  Net 570 ml   There were no vitals filed for this visit.  Examination: General: frail appearing elderly female, NAD HENT: [place]/AT mmm patent nares Lungs: CTA on room air. No accessory muscle recruitment, symmetrical chest expansion  Cardiovascular: RRR 1+ radial pulses, capillary refill < 3 seconds BUE  BLE Abdomen: soft round nd/nt. Normoactive x4 Extremities: No obvious joint deformity. No lower extremity edema Neuro: Drowsy. Awakens to voice. Follows commands LUE, LLE.  Skin: Recent pacemaker incision site clean, dry, intact without drainage.   Resolved Hospital Problem list     Assessment & Plan:   Acute CVA -s/p NIR 3x thrombectomy with reocclusion and 2x balloon angio with reocclusion  P -per neuro -SBP goal 120-140 in first 24 hours after NIR -frequent neuro checks -Continue NPO at this time -speech swallow eval   Atrial Fibrillation  -warfarin at home -? Allergy to amiodarone P -holding anticoagulation at this time in setting of SDH, risk of hemorrhagic conversion   Diabetes Mellitus P SSI  HLD -holding home statin as pt is NPO -Will restart home atorvastatin when pass swallow study   HTN -home metoprolol, lasix P -changing PO antihypertensive agent to IV form while NPO  -follow up echo results   Hypothyroidism P Synthroid IV while NPO, can change to PO when pass swallow study   Goals of Care -Patient code status updated to DNR/DNI following conversation with Dr. Brooke Dare practice:  Diet: NPO Pain/Anxiety/Delirium protocol (if indicated): APAP VAP protocol (if indicated): n/a DVT prophylaxis: SCDs GI prophylaxis: protonix IV  Glucose control: SSI Mobility: bedrest Code Status: DNR/I Family Communication: Husband, daughter at bedside Disposition: ICU   Labs   CBC: Recent Labs  Lab 01/05/19 0559  WBC 8.6  NEUTROABS 6.8  HGB 10.1*  HCT 32.6*  MCV 97.3  PLT 782    Basic Metabolic  Panel: Recent Labs  Lab 01/05/19 0559  NA 140  K 3.6  CL 104  CO2 22  GLUCOSE 112*  BUN 8  CREATININE 1.01*  CALCIUM 8.9   GFR: CrCl cannot be calculated (Unknown ideal weight.). Recent Labs  Lab 01/05/19 0559  WBC 8.6    Liver Function Tests: Recent Labs  Lab 01/05/19 0559  AST 47*  ALT 175*  ALKPHOS 51  BILITOT 1.4*  PROT 6.0*   ALBUMIN 3.2*   No results for input(s): LIPASE, AMYLASE in the last 168 hours. No results for input(s): AMMONIA in the last 168 hours.  ABG No results found for: PHART, PCO2ART, PO2ART, HCO3, TCO2, ACIDBASEDEF, O2SAT   Coagulation Profile: Recent Labs  Lab 01/05/19 0559  INR 1.59    Cardiac Enzymes: Recent Labs  Lab 01/05/19 0559  TROPONINI 0.03*    HbA1C: No results found for: HGBA1C  CBG: Recent Labs  Lab 01/05/19 1219  GLUCAP 137*    Review of Systems:   Unable to obtain from patient in setting of acute CVA  Per daughter, patient denies recent SOB, chest pain, fever, chills. Endorses recent surgery-- pacemaker placed at [facility] 1.5 weeks ago with pericardial effusion as resulting complication, now s/p pericardiocentesis.   Past Medical History  She,  has a past medical history of Arthritis, Atherosclerosis of aorta ([place]), Breast cancer ([place]) (2008), Cancer ([place]), Diabetes mellitus without complication ([place]), Dysrhythmia, Dysrhythmia, Hyperlipidemia, Hypothyroidism, and Sensorimotor neuropathy.   Surgical History    Past Surgical History:  Procedure Laterality Date  . ABLATION     cardiac  . BREAST BIOPSY Right 04/30/2016   path pending  . BREAST SURGERY    . ELECTROPHYSIOLOGIC STUDY N/A 06/22/2015   Procedure: Cardioversion;  Surgeon: Yolonda Kida, MD;  Location: [facility] ORS;  Service: Cardiovascular;  Laterality: N/A;  . ELECTROPHYSIOLOGIC STUDY N/A 04/09/2016   Procedure: Cardioversion;  Surgeon: Yolonda Kida, MD;  Location: [facility] ORS;  Service: Cardiovascular;  Laterality: N/A;  . ESOPHAGOGASTRODUODENOSCOPY (EGD) WITH PROPOFOL N/A 12/11/2016   Procedure: ESOPHAGOGASTRODUODENOSCOPY (EGD) WITH PROPOFOL;  Surgeon: Manya Silvas, MD;  Location: [facility] ENDOSCOPY;  Service: Endoscopy;  Laterality: N/A;  . MASTECTOMY Left 2008     Social History   reports that she has never smoked. She has never used smokeless tobacco. She reports that she does not drink  alcohol or use drugs.   Family History   Her family history includes Breast cancer in her cousin.   Allergies Allergies  Allergen Reactions  . Morphine And Related Anaphylaxis  . Amiodarone Other (See Comments)    tremors  . Codeine Other (See Comments)    Hallucinations   . Lovaza [Omega-3-Acid Ethyl Esters] Diarrhea  . Zaroxolyn [Metolazone] Other (See Comments)  . Ivp Dye [Iodinated Diagnostic Agents] Rash  . Penicillins Rash    Has patient had a PCN reaction causing immediate rash, facial/tongue/throat swelling, SOB or lightheadedness with hypotension: Yes Has patient had a PCN reaction causing severe rash involving mucus membranes or skin necrosis: No Has patient had a PCN reaction that required hospitalization: No Has patient had a PCN reaction occurring within the last 10 years: No If all of the above answers are "NO", then may proceed with Cephalosporin use.  . Sulfa Antibiotics Rash     Home Medications  Prior to Admission medications   Medication Sig Start Date End Date Taking? Authorizing Provider  Ascorbic Acid (VITAMIN C) 1000 MG tablet Take 1,000 mg by mouth daily.   Yes [provider]  atorvastatin (LIPITOR) 20 MG tablet Take 20 mg by mouth daily.   Yes [provider]  calcium citrate-vitamin D (CITRACAL+D) 315-200 MG-UNIT per tablet Take 1 tablet by mouth 2 (two) times daily.   Yes [provider]  cholecalciferol (VITAMIN D) 1000 UNITS tablet Take 2,000 Units by mouth daily.   Yes [provider]  fluticasone (FLONASE) 50 MCG/ACT nasal spray Place 2 sprays into both nostrils daily.   Yes [provider]  furosemide (LASIX) 40 MG tablet Take 20 mg by mouth 3 (three) times a week.    Yes [provider]  gabapentin (NEURONTIN) 300 MG capsule Take 300 mg by mouth 3 (three) times daily.   Yes [provider]  glimepiride (AMARYL) 2 MG tablet Take 2 mg by mouth daily with breakfast.   Yes [provider]  insulin glargine (LANTUS) 100 UNIT/ML injection Inject 5 Units into the skin 2 (two) times daily.    Yes [provider]  levothyroxine (SYNTHROID, LEVOTHROID) 75 MCG tablet Take 75 mcg by mouth daily before breakfast.   Yes [provider]  loperamide (IMODIUM) 2 MG capsule Take 2 mg by mouth as needed for diarrhea or loose stools.   Yes [provider]  magnesium oxide (MAG-OX) 400 MG tablet Take 400 mg by mouth daily.   Yes [provider]  metoprolol succinate (TOPROL-XL) 50 MG 24 hr tablet Take 50 mg by mouth 2 (two) times daily.    Yes [provider]  Multiple Vitamins-Minerals (MULTIVITAMIN WITH MINERALS) tablet Take 1 tablet by mouth daily.   Yes [provider]  omega-3 acid ethyl esters (LOVAZA) 1 G capsule Take 1 g by mouth 2 (two) times daily.   Yes [provider]  omeprazole (PRILOSEC) 40 MG capsule Take 40 mg by mouth 2 (two) times daily.   Yes [provider]  polyethylene glycol (MIRALAX / GLYCOLAX) packet Take 17 g by mouth daily.   Yes [provider]  potassium chloride (KLOR-CON) 8 MEQ tablet Take 10 mEq by mouth daily.   Yes [provider]  sucralfate (CARAFATE) 1 g tablet Take 1 g by mouth 4 (four) times daily -  with meals and at bedtime.   Yes [provider]  vitamin E 400 UNIT capsule Take 400 Units by mouth daily.   Yes [provider]  warfarin (COUMADIN) 5 MG tablet Take 2.5 mg by mouth daily. 5MG -Wednesday AND Friday 2.5MG -ALL OTHER DAYS   Yes [provider]     Critical care time: 30 min     Eliseo Gum MSN, AGACNP-BC [place] 01/05/2019, 3:07 PM

## 2019-01-05 NOTE — H&P (Signed)
Admission H&P    Chief Complaint: Aphasia and right sided weakness  HPI: Stacy [name] is an 83 y.o. female with DM, HLD, hypothyroidism, breast cancer, paroxysmal atrial fibrillation anticoagulated on warfarin, who arrived this morning via EMS after a fall at home. She states that she woke up and walked normally to the bathroom, then fell and "everything went haywire". She was not able to provide an exact LKN, but based on the call time to EMS from home of 3:52 AM, her LKN/TOSO is best estimated as 3:50 PM. EMS reported an initial right facial droop and speech difficulty. They noted that she was not completing sentence and was having difficulty finding words. Since arrival to the ED, her speech deficit has waxed and waned. She states that she is able to perform all of her ADLs at home and that she ambulates normally, except for frequent recent falls.   LSN: 3:50 AM tPA Given: No: Subdural hematoma seen on CT head NIHSS: 6 MRS: 0   Past Medical History:  Diagnosis Date  . Arthritis   . Atherosclerosis of aorta ([place])   . Breast cancer ([place]) 2008   mastectomy left  . Cancer ([place])    breast  . Diabetes mellitus without complication ([place])   . Dysrhythmia    atrial tachycardia  . Dysrhythmia    PSVT  . Hyperlipidemia   . Hypothyroidism   . Sensorimotor neuropathy     Past Surgical History:  Procedure Laterality Date  . ABLATION     cardiac  . BREAST BIOPSY Right 04/30/2016   path pending  . BREAST SURGERY    . ELECTROPHYSIOLOGIC STUDY N/A 06/22/2015   Procedure: Cardioversion;  Surgeon: Yolonda Kida, MD;  Location: [facility] ORS;  Service: Cardiovascular;  Laterality: N/A;  . ELECTROPHYSIOLOGIC STUDY N/A 04/09/2016   Procedure: Cardioversion;  Surgeon: Yolonda Kida, MD;  Location: [facility] ORS;  Service: Cardiovascular;  Laterality: N/A;  . ESOPHAGOGASTRODUODENOSCOPY (EGD) WITH PROPOFOL N/A 12/11/2016   Procedure: ESOPHAGOGASTRODUODENOSCOPY (EGD) WITH PROPOFOL;  Surgeon: Manya Silvas, MD;  Location: [facility] ENDOSCOPY;  Service: Endoscopy;  Laterality: N/A;  . MASTECTOMY Left 2008    Family History  Problem Relation Age of Onset  . Breast cancer Cousin    Social History:  reports that she has never smoked. She has never used smokeless tobacco. She reports that she does not drink alcohol or use drugs.  Allergies:  Allergies  Allergen Reactions  . Morphine And Related Anaphylaxis  . Amiodarone Other (See Comments)    tremors  . Codeine Other (See Comments)    Hallucinations   . Lovaza [Omega-3-Acid Ethyl Esters] Diarrhea  . Zaroxolyn [Metolazone] Other (See Comments)  . Ivp Dye [Iodinated Diagnostic Agents] Rash  . Penicillins Rash    Has patient had a PCN reaction causing immediate rash, facial/tongue/throat swelling, SOB or lightheadedness with hypotension: Yes Has patient had a PCN reaction causing severe rash involving mucus membranes or skin necrosis: No Has patient had a PCN reaction that required hospitalization: No Has patient had a PCN reaction occurring within the last 10 years: No If all of the above answers are "NO", then may proceed with Cephalosporin use.  . Sulfa Antibiotics Rash     ROS: As per HPI. Deferred detailed ROS due to acuity of presentation.   Physical Examination: Blood pressure 115/64, pulse 70, temperature 98 F (36.7 C), temperature source Oral, resp. rate 18, SpO2 97 %.  HEENT-  Normocephalic Lungs - Clear to auscultation Heart: RRR  Abdomen - Soft and nontender Extremities - No edema  Neurologic Examination: Mental Status:  Awake and alert. Responds with an incorrect number when asked the day, and again when asked the month. States "19..." when asked the year. Gives nonsensical answers when asked the city and state. States "6" when asked to count 5 fingers. Speech is hesitant. Has difficulty following several complex motor commands, but can perform simple commands. Repetition impaired. States her name and age  correctly. Mild dysarthria. Cranial Nerves: II:  Visual fields intact to bedside confrontation. PERRL.  III,IV, VI: No ptosis. EOMI with saccadic pursuits noted.  V,VII: Mild right facial droop. Facial temp sensation equal bilaterally VIII: hearing intact to voice IX,X: Palate rises symmetrically XI: Symmetric shoulder shrug XII: midline tongue extension  Motor: RUE 5/5 RLE 5/5 LUE and LLE 5/5 Subtle parietal drift seen on the right Sensory: Temp and light touch intact BUE and BLE when tested individually. Positive extinction to DSS on the right.  Deep Tendon Reflexes:  2+ bilateral brachioradialis, biceps and patellae. Difficult to elicit achilles reflexes. Toes upgoing bilaterally Cerebellar: No ataxia with H-S and FNF bilaterally  Gait: Deferred    Results for orders placed or performed during the hospital encounter of 01/05/19 (from the past 48 hour(s))  Ethanol     Status: None   Collection Time: 01/05/19  5:59 AM  Result Value Ref Range   Alcohol, Ethyl (B) <10 <10 mg/dL    Comment: (NOTE) Lowest detectable limit for serum alcohol is 10 mg/dL. For medical purposes only. Performed at [facility], [place] [address], [place], [place] [address]   Protime-INR     Status: Abnormal   Collection Time: 01/05/19  5:59 AM  Result Value Ref Range   Prothrombin Time 18.8 (H) 11.4 - 15.2 seconds   INR 1.59     Comment: Performed at [facility] [address] [place], [place] [address]  APTT     Status: None   Collection Time: 01/05/19  5:59 AM  Result Value Ref Range   aPTT 29 24 - 36 seconds    Comment: Performed at [facility] [address] [place], [place] [address]  CBC     Status: Abnormal   Collection Time: 01/05/19  5:59 AM  Result Value Ref Range   WBC 8.6 4.0 - 10.5 K/uL   RBC 3.35 (L) 3.87 - 5.11 MIL/uL   Hemoglobin 10.1 (L) 12.0 - 15.0 g/dL   HCT 32.6 (L) 36.0 - 46.0 %   MCV 97.3 80.0 - 100.0 fL   MCH 30.1 26.0 - 34.0 pg   MCHC 31.0 30.0  - 36.0 g/dL   RDW 14.3 11.5 - 15.5 %   Platelets 194 150 - 400 K/uL   nRBC 0.0 0.0 - 0.2 %    Comment: Performed at [facility], [place] [address], [place], [place] [address]  Differential     Status: None   Collection Time: 01/05/19  5:59 AM  Result Value Ref Range   Neutrophils Relative % 80 %   Neutro Abs 6.8 1.7 - 7.7 K/uL   Lymphocytes Relative 9 %   Lymphs Abs 0.8 0.7 - 4.0 K/uL   Monocytes Relative 11 %   Monocytes Absolute 0.9 0.1 - 1.0 K/uL   Eosinophils Relative 0 %   Eosinophils Absolute 0.0 0.0 - 0.5 K/uL   Basophils Relative 0 %   Basophils Absolute 0.0 0.0 - 0.1 K/uL   Immature Granulocytes  0 %   Abs Immature Granulocytes 0.03 0.00 - 0.07 K/uL    Comment: Performed at [facility], [place] [address] [place], [place] [address]  Comprehensive metabolic panel     Status: Abnormal   Collection Time: 01/05/19  5:59 AM  Result Value Ref Range   Sodium 140 135 - 145 mmol/L   Potassium 3.6 3.5 - 5.1 mmol/L   Chloride 104 98 - 111 mmol/L   CO2 22 22 - 32 mmol/L   Glucose, Bld 112 (H) 70 - 99 mg/dL   BUN 8 8 - 23 mg/dL   Creatinine, Ser 1.01 (H) 0.44 - 1.00 mg/dL   Calcium 8.9 8.9 - 10.3 mg/dL   Total Protein 6.0 (L) 6.5 - 8.1 g/dL   Albumin 3.2 (L) 3.5 - 5.0 g/dL   AST 47 (H) 15 - 41 U/L   ALT 175 (H) 0 - 44 U/L   Alkaline Phosphatase 51 38 - 126 U/L   Total Bilirubin 1.4 (H) 0.3 - 1.2 mg/dL   GFR calc non Af Amer 53 (L) >60 mL/min   GFR calc Af Amer >60 >60 mL/min   Anion gap 14 5 - 15    Comment: Performed at [facility], [place] [address], [place], [place] [address]  Troponin I - ONCE - STAT     Status: Abnormal   Collection Time: 01/05/19  5:59 AM  Result Value Ref Range   Troponin I 0.03 (HH) <0.03 ng/mL    Comment: CRITICAL RESULT CALLED TO, READ BACK BY AND VERIFIED WITH: J.EASLEY,RN 01/05/2019 [id number] DAVISB Performed at [facility], [place] [address], [place], [place] [address]    Ct Angio Head W Or Wo Contrast  Result Date:  01/05/2019 CLINICAL DATA:  Focal neuro deficit with stroke suspected EXAM: CT ANGIOGRAPHY HEAD AND NECK TECHNIQUE: Multidetector CT imaging of the head and neck was performed using the standard protocol during bolus administration of intravenous contrast. Multiplanar CT image reconstructions and MIPs were obtained to evaluate the vascular anatomy. Carotid stenosis measurements (when applicable) are obtained utilizing NASCET criteria, using the distal internal carotid diameter as the denominator. CONTRAST:  40mL ISOVUE-370 IOPAMIDOL (ISOVUE-370) INJECTION 76% COMPARISON:  Noncontrast head CT from earlier today FINDINGS: CTA NECK FINDINGS Aortic arch: Atherosclerosis.  Three vessel branching. Right carotid system: Mild atherosclerotic plaque. No stenosis or ulceration. Left carotid system: Mild atherosclerotic plaque. No stenosis or ulceration. Vertebral arteries: Proximal subclavian atherosclerosis without flow limiting stenosis. A tubal tortuosity. Both vertebral arteries are widely patent to the dura. Skeleton: Negative Other neck: No acute finding. Upper chest: Layering right pleural effusion. Review of the MIP images confirms the above findings CTA HEAD FINDINGS Anterior circulation: Left M1 to M2 occlusion with acute appearance causing a proximal meniscus sign. There is downstream under filled reconstitution vessels. Atherosclerotic calcification of the carotid siphons. Posterior circulation: Symmetric vertebral arteries. The vertebral and basilar arteries are smooth and widely patent. No branch occlusion or flow limiting stenosis. Venous sinuses: Patent as permitted by contrast timing Anatomic variants: None significant. Delayed phase: No abnormal intracranial enhancement. Known mixed density subdural hematoma along the left cerebral convexity. Review of the MIP images confirms the above findings Critical Value/emergent results were called by telephone at the time of interpretation on 01/05/2019 at 7:53 am to  Dr. Delora Fuel , who verbally acknowledged these results. IMPRESSION: 1. 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 There is underfilling of the reconstituted downstream vessels. 2. Mixed density subdural hematoma  along the left cerebral convexity, see preceding noncontrast head CT. 3. Limited atherosclerosis for age. No flow limiting stenosis or ulceration. Electronically Signed   By: Monte Fantasia M.D.   On: 01/05/2019 07:59   Dg Chest 2 View  Result Date: 01/05/2019 CLINICAL DATA:  Initial evaluation for acute trauma, fall. EXAM: CHEST - 2 VIEW COMPARISON:  Prior radiograph from 09/02/2018 FINDINGS: There has been interval placement of a triple lead transvenous pacemaker/AICD. Cardiomegaly is relatively stable. Mediastinal silhouette within normal limits. Aortic atherosclerosis. Lungs hypoinflated. No focal infiltrates. No pulmonary edema. Small layering right pleural effusion seen posteriorly on lateral projection. No pneumothorax. No acute osseous finding. Multiple surgical clips overlie the left axilla. IMPRESSION: 1. Small layering right pleural effusion. 2. No other active cardiopulmonary disease. Electronically Signed   By: Jeannine Boga M.D.   On: 01/05/2019 06:15   Dg Clavicle Left  Result Date: 01/05/2019 CLINICAL DATA:  Initial evaluation for acute left clavicular pain status post fall. EXAM: LEFT CLAVICLE - 2+ VIEWS COMPARISON:  None. FINDINGS: No acute fracture or dislocation. Osteoarthritic changes present at the left [facility] joint. Pacemaker electrodes partially overlie the clavicle. Visualized left lung apex clear. No soft tissue abnormality. IMPRESSION: No acute osseous abnormality about the left clavicle. Electronically Signed   By: Jeannine Boga M.D.   On: 01/05/2019 06:17   Ct Head Wo Contrast  Result Date: 01/05/2019 CLINICAL DATA:  83 year old female fell off toilet tonight. Garbled speech and mild facial droop. Initial encounter. EXAM: CT HEAD WITHOUT  CONTRAST CT CERVICAL SPINE WITHOUT CONTRAST TECHNIQUE: Multidetector CT imaging of the head and cervical spine was performed following the standard protocol without intravenous contrast. Multiplanar CT image reconstructions of the cervical spine were also generated. COMPARISON:  None. FINDINGS: CT HEAD FINDINGS Brain: Complex broad-base left convexity subdural hematoma. Largest component appears hypodense (possibly chronic) measuring 10.1 mm maximal thickness. More acute appearing component (which is slightly hyperdense) measures up to 4.3 mm. Mild local mass effect upon adjacent frontal-parietal gyri. Minimal bowing of the septum to the right. Thin left tentorial subdural hematoma. Tubular hyperdensity along the course of the left middle cerebral artery M1 segment may be related to thrombus or possibly tiny amount of blood. Chronic microvascular changes. Global atrophy. Vascular: As above. Skull: No skull fracture noted. Sinuses/Orbits: No acute orbital abnormality. Visualized paranasal sinuses are clear. Other: Mastoid air cells and middle ear cavities are clear. CT CERVICAL SPINE FINDINGS Alignment: Slight scoliosis convex left. Minimal anterior slip C4 secondary to right-sided facet degenerative changes. Skull base and vertebrae: No cervical spine fracture. Nonspecific 7 mm right C4 and C6 vertebral body lucency Soft tissues and spinal canal: No abnormal prevertebral soft tissue swelling. Disc levels: Scattered cervical spondylotic changes, most notable C6-7. No high-grade spinal stenosis. Upper chest: No worrisome abnormality Other: No worrisome abnormality IMPRESSION: CT HEAD: 1. Complex broad-base left convexity subdural hematoma. Largest component appears hypodense (possibly chronic) measuring 10.1 mm maximal thickness. More acute appearing component (which is slightly hyperdense) measures up to 4.3 mm. Mild local mass effect upon adjacent frontal-parietal gyri. Minimal bowing of the septum to the right. No  skull fracture. 2. Thin left tentorial subdural hematoma. 3. Tubular hyperdensity along the course of the left middle cerebral artery M1 segment may be related to thrombus or possibly tiny amount of blood. 4. Chronic microvascular changes. 5. Global atrophy. CT CERVICAL SPINE: 1. Slight scoliosis convex left. Minimal anterior slip C4 secondary to right-sided facet degenerative changes. 2. No cervical spine fracture noted. No  abnormal prevertebral soft tissue swelling. 3. Nonspecific 7 mm right C4 and C6 vertebral body lucency. These results were called by telephone at the time of interpretation on 01/05/2019 at 6:01 am to Dr. Delora Fuel , who verbally acknowledged these results. Electronically Signed   By: Genia Del M.D.   On: 01/05/2019 06:19   Ct Angio Neck W And/or Wo Contrast  Result Date: 01/05/2019 CLINICAL DATA:  Focal neuro deficit with stroke suspected EXAM: CT ANGIOGRAPHY HEAD AND NECK TECHNIQUE: Multidetector CT imaging of the head and neck was performed using the standard protocol during bolus administration of intravenous contrast. Multiplanar CT image reconstructions and MIPs were obtained to evaluate the vascular anatomy. Carotid stenosis measurements (when applicable) are obtained utilizing NASCET criteria, using the distal internal carotid diameter as the denominator. CONTRAST:  46mL ISOVUE-370 IOPAMIDOL (ISOVUE-370) INJECTION 76% COMPARISON:  Noncontrast head CT from earlier today FINDINGS: CTA NECK FINDINGS Aortic arch: Atherosclerosis.  Three vessel branching. Right carotid system: Mild atherosclerotic plaque. No stenosis or ulceration. Left carotid system: Mild atherosclerotic plaque. No stenosis or ulceration. Vertebral arteries: Proximal subclavian atherosclerosis without flow limiting stenosis. A tubal tortuosity. Both vertebral arteries are widely patent to the dura. Skeleton: Negative Other neck: No acute finding. Upper chest: Layering right pleural effusion. Review of the MIP  images confirms the above findings CTA HEAD FINDINGS Anterior circulation: Left M1 to M2 occlusion with acute appearance causing a proximal meniscus sign. There is downstream under filled reconstitution vessels. Atherosclerotic calcification of the carotid siphons. Posterior circulation: Symmetric vertebral arteries. The vertebral and basilar arteries are smooth and widely patent. No branch occlusion or flow limiting stenosis. Venous sinuses: Patent as permitted by contrast timing Anatomic variants: None significant. Delayed phase: No abnormal intracranial enhancement. Known mixed density subdural hematoma along the left cerebral convexity. Review of the MIP images confirms the above findings Critical Value/emergent results were called by telephone at the time of interpretation on 01/05/2019 at 7:53 am to Dr. Delora Fuel , who verbally acknowledged these results. IMPRESSION: 1. 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 There is underfilling of the reconstituted downstream vessels. 2. Mixed density subdural hematoma along the left cerebral convexity, see preceding noncontrast head CT. 3. Limited atherosclerosis for age. No flow limiting stenosis or ulceration. Electronically Signed   By: Monte Fantasia M.D.   On: 01/05/2019 07:59   Ct Cervical Spine Wo Contrast  Result Date: 01/05/2019 CLINICAL DATA:  83 year old female fell off toilet tonight. Garbled speech and mild facial droop. Initial encounter. EXAM: CT HEAD WITHOUT CONTRAST CT CERVICAL SPINE WITHOUT CONTRAST TECHNIQUE: Multidetector CT imaging of the head and cervical spine was performed following the standard protocol without intravenous contrast. Multiplanar CT image reconstructions of the cervical spine were also generated. COMPARISON:  None. FINDINGS: CT HEAD FINDINGS Brain: Complex broad-base left convexity subdural hematoma. Largest component appears hypodense (possibly chronic) measuring 10.1 mm maximal thickness. More acute appearing  component (which is slightly hyperdense) measures up to 4.3 mm. Mild local mass effect upon adjacent frontal-parietal gyri. Minimal bowing of the septum to the right. Thin left tentorial subdural hematoma. Tubular hyperdensity along the course of the left middle cerebral artery M1 segment may be related to thrombus or possibly tiny amount of blood. Chronic microvascular changes. Global atrophy. Vascular: As above. Skull: No skull fracture noted. Sinuses/Orbits: No acute orbital abnormality. Visualized paranasal sinuses are clear. Other: Mastoid air cells and middle ear cavities are clear. CT CERVICAL SPINE FINDINGS Alignment: Slight scoliosis convex left. Minimal anterior slip  C4 secondary to right-sided facet degenerative changes. Skull base and vertebrae: No cervical spine fracture. Nonspecific 7 mm right C4 and C6 vertebral body lucency Soft tissues and spinal canal: No abnormal prevertebral soft tissue swelling. Disc levels: Scattered cervical spondylotic changes, most notable C6-7. No high-grade spinal stenosis. Upper chest: No worrisome abnormality Other: No worrisome abnormality IMPRESSION: CT HEAD: 1. Complex broad-base left convexity subdural hematoma. Largest component appears hypodense (possibly chronic) measuring 10.1 mm maximal thickness. More acute appearing component (which is slightly hyperdense) measures up to 4.3 mm. Mild local mass effect upon adjacent frontal-parietal gyri. Minimal bowing of the septum to the right. No skull fracture. 2. Thin left tentorial subdural hematoma. 3. Tubular hyperdensity along the course of the left middle cerebral artery M1 segment may be related to thrombus or possibly tiny amount of blood. 4. Chronic microvascular changes. 5. Global atrophy. CT CERVICAL SPINE: 1. Slight scoliosis convex left. Minimal anterior slip C4 secondary to right-sided facet degenerative changes. 2. No cervical spine fracture noted. No abnormal prevertebral soft tissue swelling. 3.  Nonspecific 7 mm right C4 and C6 vertebral body lucency. These results were called by telephone at the time of interpretation on 01/05/2019 at 6:01 am to Dr. Delora Fuel , who verbally acknowledged these results. Electronically Signed   By: Genia Del M.D.   On: 01/05/2019 06:19    Assessment: 83 y.o. female presenting with acute onset of aphasia and right facial droop.  1. CT head revealed tubular hyperdensity along the course of the left middle cerebral artery M1 segment suspicious for thrombus 2. CTA reveals left M1 occlusion 3. Exam with right sided deficits and aphasia, referable to left MCA territory acute ischemia 4. Also noted on CT head are a complex broad-based left convexity subdural hematoma and a left tentorial subdural hematoma, which are contraindications to tPA. 5. Atrial fibrillation on Coumadin. INR subtherapeutic at 1.59.  6. DM on oral agents and insulin at home 7. Stroke Risk Factors - breast CA, DM, HLD and atrial fibrillation 8. The patient is a candidate for VIR. Case discussed with Dr. Estanislado Pandy 9. Recently placed pacemaker  Plan: 1. Admitting to Neuro ICU following VIR.  2. Post-VIR order set to include frequent neuro checks and BP management.  3. No antiplatelet medications or anticoagulants for at least 24 hours following VIR. Coumadin has not been reordered from her home meds list. Will also not be able to restart even if CT head shows no hemorrhagic conversion after 24 hours, due to subdural hematomas seen on CT.  4. DVT prophylaxis with SCDs.  5. Continue atorvastatin.  6. TTE.  7. Most likely will not be able to obtain MRI due to pacemaker 8. PT/OT/Speech.  9. NPO until passes swallow evaluation.  10. Sliding scale insulin.  11. Telemetry monitoring 12. Fasting lipid panel, HgbA1c  80 minutes spent in the emergent neurological evaluation and management of this critically ill patient   Electronically signed: Dr. Kerney Elbe 01/05/2019, 8:57 AM

## 2019-01-05 NOTE — Anesthesia Preprocedure Evaluation (Addendum)
Anesthesia Evaluation  Patient identified by MRN, date of birth, ID band Patient awake    Reviewed: Allergy & Precautions, H&P , NPO status , Patient's Chart, lab work & pertinent test results  Airway Mallampati: III  TM Distance: >3 FB Neck ROM: Full    Dental no notable dental hx. (+) Teeth Intact, Dental Advisory Given   Pulmonary neg pulmonary ROS,    Pulmonary exam normal breath sounds clear to auscultation       Cardiovascular + dysrhythmias + pacemaker  Rhythm:Regular Rate:Normal     Neuro/Psych CVA negative neurological ROS  negative psych ROS   GI/Hepatic negative GI ROS, Neg liver ROS,   Endo/Other  diabetes, Insulin DependentHypothyroidism   Renal/GU negative Renal ROS  negative genitourinary   Musculoskeletal  (+) Arthritis ,   Abdominal   Peds  Hematology negative hematology ROS (+)   Anesthesia Other Findings   Reproductive/Obstetrics negative OB ROS                            Anesthesia Physical Anesthesia Plan  ASA: III and emergent  Anesthesia Plan: General   Post-op Pain Management:    Induction: Intravenous, Rapid sequence and Cricoid pressure planned  PONV Risk Score and Plan: 3 and Ondansetron and Treatment may vary due to age or medical condition  Airway Management Planned: Oral ETT  Additional Equipment: Arterial line  Intra-op Plan:   Post-operative Plan: Extubation in OR  Informed Consent: I have reviewed the patients History and Physical, chart, labs and discussed the procedure including the risks, benefits and alternatives for the proposed anesthesia with the patient or authorized representative who has indicated his/her understanding and acceptance.     Dental advisory given  Plan Discussed with: CRNA  Anesthesia Plan Comments:        Anesthesia Quick Evaluation

## 2019-01-05 NOTE — Code Documentation (Signed)
83 yo female coming from home where she lives with her family with complaints of sudden onset of slurred speech, fall, and trouble getting her words out. Pt walked to the bathroom this morning at 0345 per her baseline. When she was trying to get off the toilet,she fell and stated everything went crazy. Family called EMS and EMS came to get her. When EMS arrived, pt was noted to have right facial droop, slurred speech, and trouble answering questions. EMS reported that the symptoms were resolving during transport. When patient arrived to the ED, ED RN noted some aphasia when patient was answering questions. MD ordered CT. CT showed some tubular hyperdensity along the course of the left middle cerebral artery M1. CTA ordered and showed large vessel occlusion of the M1. Code Stroke called and IR notified. Stroke Team met patient in ED 15. Pt had already completed scan. Initial NIHSS 6 due to severe aphasia, slight right sided neglect, right facial droop, inability to answer month, and dysarthria. Pt taken directly to IR. Family had to be called for consent because they had left the hospital. Delay noted to gain consent. Daughter and Husband consented with Dr. Estanislado Pandy over the phone. Handoff given to IR and CRNA. Patient to be admitted to [facility].

## 2019-01-05 NOTE — Sedation Documentation (Signed)
Patient arrived to PACU 7. Transported by this Set designer. This RN reported to PACU RN and pulse check, puncture site and NIH preformed.

## 2019-01-05 NOTE — Anesthesia Procedure Notes (Signed)
Procedure Name: Intubation Date/Time: 01/05/2019 8:56 AM Performed by: Shirlyn Goltz, CRNA Pre-anesthesia Checklist: Patient identified, Emergency Drugs available, Suction available and Patient being monitored Patient Re-evaluated:Patient Re-evaluated prior to induction Oxygen Delivery Method: Circle system utilized Preoxygenation: Pre-oxygenation with 100% oxygen Induction Type: IV induction Ventilation: Mask ventilation without difficulty Laryngoscope Size: Glidescope and 3 Grade View: Grade I Tube type: Oral Tube size: 7.0 mm Number of attempts: 2 (grade 3 view with mac 3) Airway Equipment and Method: Stylet Placement Confirmation: ETT inserted through vocal cords under direct vision,  positive ETCO2 and breath sounds checked- equal and bilateral Secured at: 21 cm Tube secured with: Tape Dental Injury: Teeth and Oropharynx as per pre-operative assessment

## 2019-01-05 NOTE — ED Notes (Signed)
Pt taken to CT.

## 2019-01-06 ENCOUNTER — Inpatient Hospital Stay (HOSPITAL_COMMUNITY): Payer: Medicare Other

## 2019-01-06 ENCOUNTER — Other Ambulatory Visit: Payer: Self-pay

## 2019-01-06 ENCOUNTER — Encounter (HOSPITAL_COMMUNITY): Payer: Self-pay | Admitting: *Deleted

## 2019-01-06 DIAGNOSIS — I63412 Cerebral infarction due to embolism of left middle cerebral artery: Principal | ICD-10-CM

## 2019-01-06 LAB — GLUCOSE, CAPILLARY
GLUCOSE-CAPILLARY: 141 mg/dL — AB (ref 70–99)
Glucose-Capillary: 101 mg/dL — ABNORMAL HIGH (ref 70–99)
Glucose-Capillary: 135 mg/dL — ABNORMAL HIGH (ref 70–99)
Glucose-Capillary: 139 mg/dL — ABNORMAL HIGH (ref 70–99)
Glucose-Capillary: 86 mg/dL (ref 70–99)
Glucose-Capillary: 141 mg/dL — ABNORMAL HIGH (ref 70–99)

## 2019-01-06 LAB — CBC WITH DIFFERENTIAL/PLATELET
ABS IMMATURE GRANULOCYTES: 0.06 10*3/uL (ref 0.00–0.07)
Basophils Absolute: 0 10*3/uL (ref 0.0–0.1)
Basophils Relative: 0 %
Eosinophils Absolute: 0 10*3/uL (ref 0.0–0.5)
Eosinophils Relative: 0 %
HCT: 25.7 % — ABNORMAL LOW (ref 36.0–46.0)
Hemoglobin: 8 g/dL — ABNORMAL LOW (ref 12.0–15.0)
Immature Granulocytes: 1 %
Lymphocytes Relative: 11 %
Lymphs Abs: 1.4 10*3/uL (ref 0.7–4.0)
MCH: 30.8 pg (ref 26.0–34.0)
MCHC: 31.1 g/dL (ref 30.0–36.0)
MCV: 98.8 fL (ref 80.0–100.0)
Monocytes Absolute: 1.8 10*3/uL — ABNORMAL HIGH (ref 0.1–1.0)
Monocytes Relative: 14 %
Neutro Abs: 10 10*3/uL — ABNORMAL HIGH (ref 1.7–7.7)
Neutrophils Relative %: 74 %
Platelets: 233 10*3/uL (ref 150–400)
RBC: 2.6 MIL/uL — ABNORMAL LOW (ref 3.87–5.11)
RDW: 14.6 % (ref 11.5–15.5)
WBC: 13.3 10*3/uL — ABNORMAL HIGH (ref 4.0–10.5)
nRBC: 0 % (ref 0.0–0.2)

## 2019-01-06 LAB — HEMOGLOBIN A1C
Hgb A1c MFr Bld: 6.9 % — ABNORMAL HIGH (ref 4.8–5.6)
Mean Plasma Glucose: 151.33 mg/dL

## 2019-01-06 LAB — BASIC METABOLIC PANEL
Anion gap: 11 (ref 5–15)
BUN: 20 mg/dL (ref 8–23)
CO2: 18 mmol/L — ABNORMAL LOW (ref 22–32)
Calcium: 8.4 mg/dL — ABNORMAL LOW (ref 8.9–10.3)
Chloride: 111 mmol/L (ref 98–111)
Creatinine, Ser: 1.09 mg/dL — ABNORMAL HIGH (ref 0.44–1.00)
GFR calc Af Amer: 56 mL/min — ABNORMAL LOW (ref >60)
GFR calc non Af Amer: 48 mL/min — ABNORMAL LOW (ref >60)
GLUCOSE: 130 mg/dL — AB (ref 70–99)
Potassium: 3.6 mmol/L (ref 3.5–5.1)
Sodium: 140 mmol/L (ref 135–145)

## 2019-01-06 LAB — LIPID PANEL
Cholesterol: 87 mg/dL (ref 0–200)
HDL: 31 mg/dL — ABNORMAL LOW (ref >40)
LDL Cholesterol: 35 mg/dL (ref 0–99)
TRIGLYCERIDES: 103 mg/dL (ref <150)
Total CHOL/HDL Ratio: 2.8 RATIO
VLDL: 21 mg/dL (ref 0–40)

## 2019-01-06 MED ORDER — ORAL CARE MOUTH RINSE
15.0000 mL | Freq: Two times a day (BID) | OROMUCOSAL | Status: DC
  Administered 2019-01-06 – 2019-01-15 (×17): 15 mL via OROMUCOSAL

## 2019-01-06 NOTE — Evaluation (Signed)
Speech Language Pathology Evaluation Patient Details Name: SIMMONE CAPE MRN: [id number] DOB: 02/21/1936 Today's Date: 01/06/2019 Time: 0867-6195 SLP Time Calculation (min) (ACUTE ONLY): 37 min  Problem List:  Patient Active Problem List   Diagnosis Date Noted  . 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 Arthritis   . Atherosclerosis of aorta ([place])   . Breast cancer ([place]) 2008   mastectomy left  . Cancer ([place])    breast  . Diabetes mellitus without complication ([place])   . Dysrhythmia    atrial tachycardia  . Dysrhythmia    PSVT  . Hyperlipidemia   . Hypothyroidism   . Sensorimotor neuropathy    Past Surgical History:  Past Surgical History:  Procedure Laterality Date  . ABLATION     cardiac  . BREAST BIOPSY Right 04/30/2016   path pending  . BREAST SURGERY    . ELECTROPHYSIOLOGIC STUDY N/A 06/22/2015   Procedure: Cardioversion;  Surgeon: Yolonda Kida, MD;  Location: [facility] ORS;  Service: Cardiovascular;  Laterality: N/A;  . ELECTROPHYSIOLOGIC STUDY N/A 04/09/2016   Procedure: Cardioversion;  Surgeon: Yolonda Kida, MD;  Location: [facility] ORS;  Service: Cardiovascular;  Laterality: N/A;  . ESOPHAGOGASTRODUODENOSCOPY (EGD) WITH PROPOFOL N/A 12/11/2016   Procedure: ESOPHAGOGASTRODUODENOSCOPY (EGD) WITH PROPOFOL;  Surgeon: Manya Silvas, MD;  Location: [facility] ENDOSCOPY;  Service: Endoscopy;  Laterality: N/A;  . MASTECTOMY Left 2008  . RADIOLOGY WITH ANESTHESIA N/A 01/05/2019   Procedure: IR WITH ANESTHESIA;  Surgeon: Radiologist, Medication, MD;  Location: [facility];  Service: Radiology;  Laterality: N/A;   HPI:  Pt is an 83 y.o. female with DM, HLD, hypothyroidism, breast cancer, paroxysmal atrial fibrillation anticoagulated on warfarin, who was admitted following a fall at home. She states that she woke up and walked normally to the bathroom, then fell and  "everything went haywire". EMS reported an initial right facial droop and speech difficulty. They noted that she was not completing sentence and was having difficulty finding words. Since arrival to the ED, her speech deficit has waxed and waned. The CT of the head of 01/06/19 was negative for acute infarct but revealed dense left MCA: recurrent occlusion possible versus enhancingresidual thrombus. Similar to decreased mixed density LEFT holo hemispheric subdural hematoma. She is status post failed mechanical thrombectomy and balloon angioplasty. Stent not deployed due to concurrent subdural hematoma and increased risk of hemorrhage.   Assessment / Plan / Recommendation Clinical Impression  Pt was seen for speech/language evaluation with her son and husband present. Pt's son reported that the pt may have say "yeh" but has not otherwise produced any verbal output. She demonstrated a left eye gaze preference throughout the evaluation and materials were therefore presented to her left; however, this did not improve accuracy. Evaluation revealed severe global aphasia with significant deficits in both comprehension and verbal expression. Pt vocalized twice during the evaluation but did not produce any meaningful speech despite moderate verbal prompts from this SLP. She was unable to follow simple directions or even identify objects from a field of two. SLP will follow for aphasia intervention and to complete the bedside swallow evaluation when the pt is able to be seated upright.     SLP Assessment  SLP Recommendation/Assessment: Patient needs continued Speech Lanaguage Pathology Services SLP Visit Diagnosis: Aphasia (R47.01);Other (comment)(Swallow evaluation)    Follow Up Recommendations       Frequency and Duration min  2x/week  2 weeks      SLP Evaluation Cognition  Overall Cognitive Status: Difficult to assess(secondary to severe language impairment) Arousal/Alertness: Awake/alert Orientation  Level: Disoriented X4       Comprehension  Auditory Comprehension Overall Auditory Comprehension: Impaired Yes/No Questions: Impaired Basic Biographical Questions: (0% accuracy despite cues ) Commands: Impaired One Step Basic Commands: (0% accuracy despite cues ) Visual Recognition/Discrimination Discrimination: Exceptions to WFL(Left eye gaze noted with no medialization) Reading Comprehension Reading Status: Not tested    Expression Verbal Expression Overall Verbal Expression: Impaired Initiation: Impaired Automatic Speech: (Pt was unable to produce any automatic sequences) Level of Generative/Spontaneous Verbalization: (Pt did not verbalize throughout the evaluation) Repetition: Impaired Level of Impairment: Word level Naming: Impairment Responsive: (0% accuracy despite max verbal cues ) Confrontation: Impaired Convergent: (0% accuracy despite cues ) Divergent: Not tested Verbal Errors: (No  verbalization was noted during the evaluation ) Pragmatics: Unable to assess   Oral / Motor  Motor Speech Overall Motor Speech: Other (comment)(Unable to assess due to language impairment)    Arrietty Dercole I. Hardin Negus, [place], [place] Office number [phone] Pager [place] 01/06/2019, 4:30 PM

## 2019-01-06 NOTE — Progress Notes (Signed)
Neuro IR Left 4 french femoral sheath removed at 1158.  VPAD and manual pressure applied to achieve hemostasis at 1215.  Distal pulses intact.  No acute complications.  Site dressed and reviewed with Lauren RN.   [place] RT-R

## 2019-01-06 NOTE — Progress Notes (Signed)
Patient's femoral arterial line has been running higher than her cuff pressure all night. The femoral line became super positional after patient attempted to bend left leg multiple times. Verbal order from Dr. Lorraine Lax to use soft leg restraint on left lower extremity. Cleviprex titrated up to keep arterial line SBP in 120-160 range, but patient's cuff pressure became soft with a MAP <65. Another RN attempted to position the femoral arterial line better, with some success. But with patient's constant movement, was short lived. No longer going by the patient's femoral arterial line to titrate blood pressure medicine.

## 2019-01-06 NOTE — Progress Notes (Signed)
Referring Physician(s): CODE STROKE- Kerney Elbe  Supervising Physician: Luanne Bras  Patient Status:  [facility] - In-pt  Chief Complaint: None  Subjective:  Left MCA M1 segment occlusion s/p emergent mechanical thrombectomy and balloon angioplasty x2 achieving a TICI 2 revascularization with reocclusion 01/05/2019 by Dr. Estanislado Pandy. Patient awake and alert laying in bed. Accompanied by husband and son. Demonstrates expressive aphasia, can follow some simple commands (able to wiggle toes), but none involving her head/neck (unable to protrude tongue/smile). Can spontaneously move left extremities but demonstrates minimal movement of right extremities (can wiggle toes of right side). Right groin incision c/d/i. Left groin incision c/d/i with sheath intact.   Allergies: Morphine and related; Amiodarone; Codeine; Lovaza [omega-3-acid ethyl esters]; Zaroxolyn [metolazone]; Ivp dye [iodinated diagnostic agents]; Penicillins; and Sulfa antibiotics  Medications: Prior to Admission medications   Medication Sig Start Date End Date Taking? Authorizing Provider  Ascorbic Acid (VITAMIN C) 1000 MG tablet Take 1,000 mg by mouth daily.   Yes [provider]  atorvastatin (LIPITOR) 20 MG tablet Take 20 mg by mouth daily.   Yes [provider]  calcium citrate-vitamin D (CITRACAL+D) 315-200 MG-UNIT per tablet Take 1 tablet by mouth 2 (two) times daily.   Yes [provider]  cholecalciferol (VITAMIN D) 1000 UNITS tablet Take 2,000 Units by mouth daily.   Yes [provider]  fluticasone (FLONASE) 50 MCG/ACT nasal spray Place 2 sprays into both nostrils daily.   Yes [provider]  furosemide (LASIX) 40 MG tablet Take 20 mg by mouth 3 (three) times a week.    Yes [provider]  gabapentin (NEURONTIN) 300 MG capsule Take 300 mg by mouth 3 (three) times daily.   Yes [provider]  glimepiride (AMARYL) 2 MG tablet Take 2 mg by mouth  daily with breakfast.   Yes [provider]  insulin glargine (LANTUS) 100 UNIT/ML injection Inject 5 Units into the skin 2 (two) times daily.    Yes [provider]  levothyroxine (SYNTHROID, LEVOTHROID) 75 MCG tablet Take 75 mcg by mouth daily before breakfast.   Yes [provider]  loperamide (IMODIUM) 2 MG capsule Take 2 mg by mouth as needed for diarrhea or loose stools.   Yes [provider]  magnesium oxide (MAG-OX) 400 MG tablet Take 400 mg by mouth daily.   Yes [provider]  metoprolol succinate (TOPROL-XL) 50 MG 24 hr tablet Take 50 mg by mouth 2 (two) times daily.    Yes [provider]  Multiple Vitamins-Minerals (MULTIVITAMIN WITH MINERALS) tablet Take 1 tablet by mouth daily.   Yes [provider]  omega-3 acid ethyl esters (LOVAZA) 1 G capsule Take 1 g by mouth 2 (two) times daily.   Yes [provider]  omeprazole (PRILOSEC) 40 MG capsule Take 40 mg by mouth 2 (two) times daily.   Yes [provider]  polyethylene glycol (MIRALAX / GLYCOLAX) packet Take 17 g by mouth daily.   Yes [provider]  potassium chloride (KLOR-CON) 8 MEQ tablet Take 10 mEq by mouth daily.   Yes [provider]  sucralfate (CARAFATE) 1 g tablet Take 1 g by mouth 4 (four) times daily -  with meals and at bedtime.   Yes [provider]  vitamin E 400 UNIT capsule Take 400 Units by mouth daily.   Yes [provider]  warfarin (COUMADIN) 5 MG tablet Take 2.5 mg by mouth daily. 5MG -Wednesday AND Friday 2.5MG -ALL OTHER DAYS  Yes [provider]     Vital Signs: BP (!) 128/47   Pulse 70   Temp 99.5 F (37.5 C) (Axillary)   Resp (!) 23   SpO2 99%   Physical Exam Vitals signs and nursing note reviewed.  Constitutional:      General: She is not in acute distress.    Appearance: Normal appearance.  Pulmonary:     Effort: Pulmonary effort is normal. No respiratory distress.    Skin:    General: Skin is warm and dry.     Comments: Right groin incision soft without active bleeding or hematoma. Left groin incision soft with sheath intact, no active bleeding or hematoma.  Neurological:     Mental Status: She is alert.     Comments: Awake and alert, can follow some simple commands (able to wiggle toes), but none involving her head/neck (unable to protrude tongue/smile). Demonstrates expressive aphasia. PERRL bilaterally. EOMs intact bilaterally without nystagmus or subjective diplopia. Visual fields not assessed. Unable to assess facial asymmetry as patient would not follow commands. Unable to assess tongue protrusion as patient would not follow commands. Can spontaneously move left extremities but demonstrates minimal movement of right extremities (can wiggle toes of right side). Pronator drift not assessed. Fine motor and coordination not assessed. Gait not assessed. Romberg not assessed. Heel to toe not assessed. Distal pulses 2+ bilaterally.  Psychiatric:     Comments: Demonstrates expressive aphasia.     Imaging: Ct Angio Head W Or Wo Contrast  Result Date: 01/05/2019 CLINICAL DATA:  Focal neuro deficit with stroke suspected EXAM: CT ANGIOGRAPHY HEAD AND NECK TECHNIQUE: Multidetector CT imaging of the head and neck was performed using the standard protocol during bolus administration of intravenous contrast. Multiplanar CT image reconstructions and MIPs were obtained to evaluate the vascular anatomy. Carotid stenosis measurements (when applicable) are obtained utilizing NASCET criteria, using the distal internal carotid diameter as the denominator. CONTRAST:  64mL ISOVUE-370 IOPAMIDOL (ISOVUE-370) INJECTION 76% COMPARISON:  Noncontrast head CT from earlier today FINDINGS: CTA NECK FINDINGS Aortic arch: Atherosclerosis.  Three vessel branching. Right carotid system: Mild atherosclerotic plaque. No stenosis or ulceration. Left carotid system: Mild  atherosclerotic plaque. No stenosis or ulceration. Vertebral arteries: Proximal subclavian atherosclerosis without flow limiting stenosis. A tubal tortuosity. Both vertebral arteries are widely patent to the dura. Skeleton: Negative Other neck: No acute finding. Upper chest: Layering right pleural effusion. Review of the MIP images confirms the above findings CTA HEAD FINDINGS Anterior circulation: Left M1 to M2 occlusion with acute appearance causing a proximal meniscus sign. There is downstream under filled reconstitution vessels. Atherosclerotic calcification of the carotid siphons. Posterior circulation: Symmetric vertebral arteries. The vertebral and basilar arteries are smooth and widely patent. No branch occlusion or flow limiting stenosis. Venous sinuses: Patent as permitted by contrast timing Anatomic variants: None significant. Delayed phase: No abnormal intracranial enhancement. Known mixed density subdural hematoma along the left cerebral convexity. Review of the MIP images confirms the above findings Critical Value/emergent results were called by telephone at the time of interpretation on 01/05/2019 at 7:53 am to Dr. Delora Fuel , who verbally acknowledged these results. IMPRESSION: 1. 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 There is underfilling of the reconstituted downstream vessels. 2. Mixed density subdural hematoma along the left cerebral convexity, see preceding noncontrast head CT. 3. Limited atherosclerosis for age. No flow limiting stenosis or ulceration. Electronically Signed   By: Monte Fantasia M.D.   On: 01/05/2019 07:59   Dg Chest  2 View  Result Date: 01/05/2019 CLINICAL DATA:  Initial evaluation for acute trauma, fall. EXAM: CHEST - 2 VIEW COMPARISON:  Prior radiograph from 09/02/2018 FINDINGS: There has been interval placement of a triple lead transvenous pacemaker/AICD. Cardiomegaly is relatively stable. Mediastinal silhouette within normal limits. Aortic  atherosclerosis. Lungs hypoinflated. No focal infiltrates. No pulmonary edema. Small layering right pleural effusion seen posteriorly on lateral projection. No pneumothorax. No acute osseous finding. Multiple surgical clips overlie the left axilla. IMPRESSION: 1. Small layering right pleural effusion. 2. No other active cardiopulmonary disease. Electronically Signed   By: Jeannine Boga M.D.   On: 01/05/2019 06:15   Dg Clavicle Left  Result Date: 01/05/2019 CLINICAL DATA:  Initial evaluation for acute left clavicular pain status post fall. EXAM: LEFT CLAVICLE - 2+ VIEWS COMPARISON:  None. FINDINGS: No acute fracture or dislocation. Osteoarthritic changes present at the left [facility] joint. Pacemaker electrodes partially overlie the clavicle. Visualized left lung apex clear. No soft tissue abnormality. IMPRESSION: No acute osseous abnormality about the left clavicle. Electronically Signed   By: Jeannine Boga M.D.   On: 01/05/2019 06:17   Ct Head Wo Contrast  Result Date: 01/06/2019 CLINICAL DATA:  Follow up stroke. Status post endovascular revascularization of LEFT MCA occlusion. EXAM: CT HEAD WITHOUT CONTRAST TECHNIQUE: Contiguous axial images were obtained from the base of the skull through the vertex without intravenous contrast. COMPARISON:  None. FINDINGS: BRAIN: No intraparenchymal hemorrhage, mass effect nor midline shift. The ventricles and sulci are normal for age. Patchy supratentorial white matter hypodensities within normal range for patient's age, though non-specific are most compatible with chronic small vessel ischemic disease. No acute large vascular territory infarcts. Similar to decreased 8 mm RIGHT holo hemispheric mixed density subdural hematoma. VASCULAR: Moderate calcific atherosclerosis of the carotid siphons. Asymmetrically dense LEFT MCA best appreciated on sagittal series. The SKULL: No skull fracture. No significant scalp soft tissue swelling. SINUSES/ORBITS: Paranasal  sinuses are well aerated. Trace bilateral mastoid effusions. Included ocular globes and orbital contents are non-suspicious. Status post bilateral ocular lens implants. OTHER: None. IMPRESSION: 1. Dense LEFT MCA: Recurrent occlusion possible versus enhancing residual thrombus. 2. No acute infarct. 3. Similar to decreased mixed density LEFT holo hemispheric subdural hematoma. 4. These results will be called to the ordering clinician or representative by the professional radiologist assistant, and communication documented in [employer]board. Electronically Signed   By: Elon Alas M.D.   On: 01/06/2019 05:14   Ct Head Wo Contrast  Result Date: 01/05/2019 CLINICAL DATA:  83 year old female fell off toilet tonight. Garbled speech and mild facial droop. Initial encounter. EXAM: CT HEAD WITHOUT CONTRAST CT CERVICAL SPINE WITHOUT CONTRAST TECHNIQUE: Multidetector CT imaging of the head and cervical spine was performed following the standard protocol without intravenous contrast. Multiplanar CT image reconstructions of the cervical spine were also generated. COMPARISON:  None. FINDINGS: CT HEAD FINDINGS Brain: Complex broad-base left convexity subdural hematoma. Largest component appears hypodense (possibly chronic) measuring 10.1 mm maximal thickness. More acute appearing component (which is slightly hyperdense) measures up to 4.3 mm. Mild local mass effect upon adjacent frontal-parietal gyri. Minimal bowing of the septum to the right. Thin left tentorial subdural hematoma. Tubular hyperdensity along the course of the left middle cerebral artery M1 segment may be related to thrombus or possibly tiny amount of blood. Chronic microvascular changes. Global atrophy. Vascular: As above. Skull: No skull fracture noted. Sinuses/Orbits: No acute orbital abnormality. Visualized paranasal sinuses are clear. Other: Mastoid air cells and middle ear cavities are  clear. CT CERVICAL SPINE FINDINGS Alignment: Slight scoliosis  convex left. Minimal anterior slip C4 secondary to right-sided facet degenerative changes. Skull base and vertebrae: No cervical spine fracture. Nonspecific 7 mm right C4 and C6 vertebral body lucency Soft tissues and spinal canal: No abnormal prevertebral soft tissue swelling. Disc levels: Scattered cervical spondylotic changes, most notable C6-7. No high-grade spinal stenosis. Upper chest: No worrisome abnormality Other: No worrisome abnormality IMPRESSION: CT HEAD: 1. Complex broad-base left convexity subdural hematoma. Largest component appears hypodense (possibly chronic) measuring 10.1 mm maximal thickness. More acute appearing component (which is slightly hyperdense) measures up to 4.3 mm. Mild local mass effect upon adjacent frontal-parietal gyri. Minimal bowing of the septum to the right. No skull fracture. 2. Thin left tentorial subdural hematoma. 3. Tubular hyperdensity along the course of the left middle cerebral artery M1 segment may be related to thrombus or possibly tiny amount of blood. 4. Chronic microvascular changes. 5. Global atrophy. CT CERVICAL SPINE: 1. Slight scoliosis convex left. Minimal anterior slip C4 secondary to right-sided facet degenerative changes. 2. No cervical spine fracture noted. No abnormal prevertebral soft tissue swelling. 3. Nonspecific 7 mm right C4 and C6 vertebral body lucency. These results were called by telephone at the time of interpretation on 01/05/2019 at 6:01 am to Dr. Delora Fuel , who verbally acknowledged these results. Electronically Signed   By: Genia Del M.D.   On: 01/05/2019 06:19   Ct Angio Neck W And/or Wo Contrast  Result Date: 01/05/2019 CLINICAL DATA:  Focal neuro deficit with stroke suspected EXAM: CT ANGIOGRAPHY HEAD AND NECK TECHNIQUE: Multidetector CT imaging of the head and neck was performed using the standard protocol during bolus administration of intravenous contrast. Multiplanar CT image reconstructions and MIPs were obtained to  evaluate the vascular anatomy. Carotid stenosis measurements (when applicable) are obtained utilizing NASCET criteria, using the distal internal carotid diameter as the denominator. CONTRAST:  22mL ISOVUE-370 IOPAMIDOL (ISOVUE-370) INJECTION 76% COMPARISON:  Noncontrast head CT from earlier today FINDINGS: CTA NECK FINDINGS Aortic arch: Atherosclerosis.  Three vessel branching. Right carotid system: Mild atherosclerotic plaque. No stenosis or ulceration. Left carotid system: Mild atherosclerotic plaque. No stenosis or ulceration. Vertebral arteries: Proximal subclavian atherosclerosis without flow limiting stenosis. A tubal tortuosity. Both vertebral arteries are widely patent to the dura. Skeleton: Negative Other neck: No acute finding. Upper chest: Layering right pleural effusion. Review of the MIP images confirms the above findings CTA HEAD FINDINGS Anterior circulation: Left M1 to M2 occlusion with acute appearance causing a proximal meniscus sign. There is downstream under filled reconstitution vessels. Atherosclerotic calcification of the carotid siphons. Posterior circulation: Symmetric vertebral arteries. The vertebral and basilar arteries are smooth and widely patent. No branch occlusion or flow limiting stenosis. Venous sinuses: Patent as permitted by contrast timing Anatomic variants: None significant. Delayed phase: No abnormal intracranial enhancement. Known mixed density subdural hematoma along the left cerebral convexity. Review of the MIP images confirms the above findings Critical Value/emergent results were called by telephone at the time of interpretation on 01/05/2019 at 7:53 am to Dr. Delora Fuel , who verbally acknowledged these results. IMPRESSION: 1. 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 There is underfilling of the reconstituted downstream vessels. 2. Mixed density subdural hematoma along the left cerebral convexity, see preceding noncontrast head CT. 3. Limited  atherosclerosis for age. No flow limiting stenosis or ulceration. Electronically Signed   By: Monte Fantasia M.D.   On: 01/05/2019 07:59   Ct Cervical Spine Wo Contrast  Result Date: 01/05/2019 CLINICAL DATA:  83 year old female fell off toilet tonight. Garbled speech and mild facial droop. Initial encounter. EXAM: CT HEAD WITHOUT CONTRAST CT CERVICAL SPINE WITHOUT CONTRAST TECHNIQUE: Multidetector CT imaging of the head and cervical spine was performed following the standard protocol without intravenous contrast. Multiplanar CT image reconstructions of the cervical spine were also generated. COMPARISON:  None. FINDINGS: CT HEAD FINDINGS Brain: Complex broad-base left convexity subdural hematoma. Largest component appears hypodense (possibly chronic) measuring 10.1 mm maximal thickness. More acute appearing component (which is slightly hyperdense) measures up to 4.3 mm. Mild local mass effect upon adjacent frontal-parietal gyri. Minimal bowing of the septum to the right. Thin left tentorial subdural hematoma. Tubular hyperdensity along the course of the left middle cerebral artery M1 segment may be related to thrombus or possibly tiny amount of blood. Chronic microvascular changes. Global atrophy. Vascular: As above. Skull: No skull fracture noted. Sinuses/Orbits: No acute orbital abnormality. Visualized paranasal sinuses are clear. Other: Mastoid air cells and middle ear cavities are clear. CT CERVICAL SPINE FINDINGS Alignment: Slight scoliosis convex left. Minimal anterior slip C4 secondary to right-sided facet degenerative changes. Skull base and vertebrae: No cervical spine fracture. Nonspecific 7 mm right C4 and C6 vertebral body lucency Soft tissues and spinal canal: No abnormal prevertebral soft tissue swelling. Disc levels: Scattered cervical spondylotic changes, most notable C6-7. No high-grade spinal stenosis. Upper chest: No worrisome abnormality Other: No worrisome abnormality IMPRESSION: CT HEAD:  1. Complex broad-base left convexity subdural hematoma. Largest component appears hypodense (possibly chronic) measuring 10.1 mm maximal thickness. More acute appearing component (which is slightly hyperdense) measures up to 4.3 mm. Mild local mass effect upon adjacent frontal-parietal gyri. Minimal bowing of the septum to the right. No skull fracture. 2. Thin left tentorial subdural hematoma. 3. Tubular hyperdensity along the course of the left middle cerebral artery M1 segment may be related to thrombus or possibly tiny amount of blood. 4. Chronic microvascular changes. 5. Global atrophy. CT CERVICAL SPINE: 1. Slight scoliosis convex left. Minimal anterior slip C4 secondary to right-sided facet degenerative changes. 2. No cervical spine fracture noted. No abnormal prevertebral soft tissue swelling. 3. Nonspecific 7 mm right C4 and C6 vertebral body lucency. These results were called by telephone at the time of interpretation on 01/05/2019 at 6:01 am to Dr. Delora Fuel , who verbally acknowledged these results. Electronically Signed   By: Genia Del M.D.   On: 01/05/2019 06:19    Labs:  CBC: Recent Labs    09/02/18 0930 01/05/19 0559 01/06/19 0510  WBC 7.3 8.6 13.3*  HGB 13.1 10.1* 8.0*  HCT 38.2 32.6* 25.7*  PLT 129* 194 233    COAGS: Recent Labs    09/02/18 0930 10/31/18 0926 01/05/19 0559  INR 2.42 2.86 1.59  APTT  --   --  29    BMP: Recent Labs    09/02/18 0930 01/05/19 0559 01/06/19 0510  NA 140 140 140  K 4.1 3.6 3.6  CL 106 104 111  CO2 28 22 18*  GLUCOSE 181* 112* 130*  BUN 16 8 20   CALCIUM 9.0 8.9 8.4*  CREATININE 0.88 1.01* 1.09*  GFRNONAA >60 53* 48*  GFRAA >60 >60 56*    LIVER FUNCTION TESTS: Recent Labs    01/05/19 0559  BILITOT 1.4*  AST 47*  ALT 175*  ALKPHOS 51  PROT 6.0*  ALBUMIN 3.2*    Assessment and Plan:  Left MCA M1 segment occlusion s/p emergent mechanical thrombectomy and balloon angioplasty  x2 achieving a TICI 2 revascularization  with reocclusion 01/05/2019 by Dr. Estanislado Pandy. Patient's condition stable- still with expressive aphasia, can move left side but minimal movements of right side. Right groin incision stable. Left groin incision stable with sheath intact- plan for sheath removal today. Appreciate and agree with neurology and CCM management. IR to follow.   Electronically Signed: Earley Abide, PA-C 01/06/2019, 9:10 AM   I spent a total of 25 Minutes at the the patient's bedside AND on the patient's hospital floor or unit, greater than 50% of which was counseling/coordinating care for left MCA M1 segment occlusion s/p revascularization.

## 2019-01-06 NOTE — Progress Notes (Signed)
OT/ PT  Cancellation Note  Patient Details Name: SONIYAH MCGLORY MRN: [id number] DOB: Jun 26, 1936   Cancelled Treatment:    Reason Eval/Treat Not Completed: Medical issues which prohibited therapy(femoral sheath present at this time) OT / PT to check back at a more appropriate time. Please increase activity orders as appropriate.   Richelle Ito, OTR/L  Acute Rehabilitation Services Pager: [phone] Office: [phone] .  01/06/2019, 9:15 AM

## 2019-01-06 NOTE — Procedures (Signed)
History: 83 yo F being evaluated for possible seizures  Sedation: None  Technique: This is a 21 channel routine scalp EEG performed at the bedside with bipolar and monopolar montages arranged in accordance to the international 10/20 system of electrode placement. One channel was dedicated to EKG recording.    Background: There is a posterior dominant rhythm of 8 Hz which is relatively poorly sustained.  There is generalized irregular delta and theta activity, as well as a focal prominence of irregular slow activity in the left hemisphere maximal in the left temporal region.  There are occasional frontally predominant discharges with a shifting predominance and triphasic morphology.  Photic stimulation: Physiologic driving is not performed  EEG Abnormalities: 1) triphasic waves 2) focal left hemispheric irregular slow activity 3) generalized irregular slow activity  Clinical Interpretation: This EEG is consistent with a nonspecific focal left hemispheric disturbance in the setting of a generalized nonspecific cerebral dysfunction (encephalopathy). There was no seizure or seizure predisposition recorded on this study. Please note that lack of epileptiform activity on EEG does not preclude the possibility of epilepsy.   Roland Rack, MD [facility] [phone]  If 7pm- 7am, please page neurology on call as listed in [place].

## 2019-01-06 NOTE — Progress Notes (Signed)
EEG completed, results pending. 

## 2019-01-06 NOTE — Progress Notes (Signed)
NAME:  Stacy [name], MRN:  [id number], DOB:  [birth year], LOS: 1 ADMISSION DATE:  01/05/2019, CONSULTATION DATE:  1/21 REFERRING MD:  , CHIEF COMPLAINT:  CVA  Brief History   83yo F admitted with acute CVA s/p revascularization.   Past Medical History  DM HLD HTN Breast cancer A Fib on home warfarin Pericardial effusion   Significant Hospital Events   1/21> reocclusion of vessels s/p thrombectomy x3 and balloon angioplasty x 2 1/21> admitted to neuro ICU   Consults:  PCCM  Procedures:  1/21> thrombectomy x 3 with reocclusion and balloon angio x 2 with reocclusion  Significant Diagnostic Tests:  CT head 1/21> multiple L SDHs. M1 thrombus CT angio head 1/21> L M1 M2 occlusion  Micro Data:    Antimicrobials:    Interim history/subjective:  No acute events overnight  Objective   Blood pressure (!) 112/52, pulse 70, temperature 99.5 F (37.5 C), temperature source Axillary, resp. rate (!) 23, SpO2 98 %.        Intake/Output Summary (Last 24 hours) at 01/06/2019 1017 Last data filed at 01/06/2019 1000 Gross per 24 hour  Intake 2149.53 ml  Output 700 ml  Net 1449.53 ml   There were no vitals filed for this visit.  Examination: General: elderly frail appearing female, NAD HENT: [place]/AT, mmm Lungs: CTA on room air, no accessory muscle recruitment Cardiovascular: RRR no r/g/m. 2+ radial pulses, 1+ pedal pulses, capillary refill < 3 sec BUE BLE Abdomen: soft, round, normoactive x4 Extremities: Symmetrical bulk and tone, no lower extremity edema  Neuro: Awake, calm. Does not follow commands. Moves spontaneously  Skin: clean, dry, warm intact. Recent pacemaker incision site clean, dry, no drainage  Resolved Hospital Problem list     Assessment & Plan:    Acute CVA S/p NIR intervention with subsequent reocclusion  P -per neuro  -SBP goals per neuro, goal to be off cleviprex today  -EEG today per neuro -frequent neuro checks -NPO, will eventually need  swallow eval   Atrial Fibrilation -warfarin at home -? Allergy to amiodarone P -holding anticoagulation in settings of SDH -currently sinus rhythm   Diabetes Mellitus P SSI  HLD -holding home statin as pt is NPO -Will restart home atorvastatin when pass swallow study   HTN -home metoprolol, lasix -ECHO 1/21 60-65% P -transitioning off cleviprex -Will need comperable IV form of home antihypertensive regimen when transitioned off cleviprex gtt and while NPO  Hypothyroidism P Synthroid IV while NPO,  -can change to PO when pass swallow study   Goals of Care -Patient code status updated to DNR/DNI following conversation with Dr. Leonie Man   Thank you for consulting PCCM. We will sign off at this time. Please let us know if any further assistance is needed.  Best practice:  Diet: NPO Pain/Anxiety/Delirium protocol (if indicated): APAP VAP protocol (if indicated): n/a DVT prophylaxis: SCDs GI prophylaxis: protonix IV  Glucose control: SSI Mobility: bedrest Code Status: DNR/I Family Communication: Husband, son at bedside Disposition: Neuro ICU   Labs   CBC: Recent Labs  Lab 01/05/19 0559 01/06/19 0510  WBC 8.6 13.3*  NEUTROABS 6.8 10.0*  HGB 10.1* 8.0*  HCT 32.6* 25.7*  MCV 97.3 98.8  PLT 194 300    Basic Metabolic Panel: Recent Labs  Lab 01/05/19 0559 01/06/19 0510  NA 140 140  K 3.6 3.6  CL 104 111  CO2 22 18*  GLUCOSE 112* 130*  BUN 8 20  CREATININE 1.01* 1.09*  CALCIUM 8.9 8.4*  GFR: CrCl cannot be calculated (Unknown ideal weight.). Recent Labs  Lab 01/05/19 0559 01/06/19 0510  WBC 8.6 13.3*    Liver Function Tests: Recent Labs  Lab 01/05/19 0559  AST 47*  ALT 175*  ALKPHOS 51  BILITOT 1.4*  PROT 6.0*  ALBUMIN 3.2*   No results for input(s): LIPASE, AMYLASE in the last 168 hours. No results for input(s): AMMONIA in the last 168 hours.  ABG No results found for: PHART, PCO2ART, PO2ART, HCO3, TCO2, ACIDBASEDEF, O2SAT    Coagulation Profile: Recent Labs  Lab 01/05/19 0559  INR 1.59    Cardiac Enzymes: Recent Labs  Lab 01/05/19 0559  TROPONINI 0.03*    HbA1C: Hgb A1c MFr Bld  Date/Time Value Ref Range Status  01/06/2019 05:10 AM 6.9 (H) 4.8 - 5.6 % Final    Comment:    (NOTE) Pre diabetes:          5.7%-6.4% Diabetes:              >6.4% Glycemic control for   <7.0% adults with diabetes     CBG: Recent Labs  Lab 01/05/19 1550 01/05/19 2000 01/05/19 2322 01/06/19 0325 01/06/19 0748  GLUCAP 179* 155* 141* 101* [place] MSN, AGACNP-BC [facility] 01/06/2019, 10:17 AM

## 2019-01-06 NOTE — Progress Notes (Signed)
STROKE TEAM PROGRESS NOTE   INTERVAL HISTORY Her husband and son are at the bedside.    Vitals:   01/06/19 0700 01/06/19 0730 01/06/19 0800 01/06/19 0830  BP: (!) 114/52 (!) 122/53 (!) 118/54 (!) 128/47  Pulse: 90 85 91 70  Resp: (!) 21 15 20  (!) 23  Temp:   99.5 F (37.5 C)   TempSrc:   Axillary   SpO2: 98% 99% 98% 99%    CBC:  Recent Labs  Lab 01/05/19 0559 01/06/19 0510  WBC 8.6 13.3*  NEUTROABS 6.8 10.0*  HGB 10.1* 8.0*  HCT 32.6* 25.7*  MCV 97.3 98.8  PLT 194 010    Basic Metabolic Panel:  Recent Labs  Lab 01/05/19 0559 01/06/19 0510  NA 140 140  K 3.6 3.6  CL 104 111  CO2 22 18*  GLUCOSE 112* 130*  BUN 8 20  CREATININE 1.01* 1.09*  CALCIUM 8.9 8.4*   Lipid Panel:     Component Value Date/Time   CHOL 87 01/06/2019 0510   TRIG 103 01/06/2019 0510   HDL 31 (L) 01/06/2019 0510   CHOLHDL 2.8 01/06/2019 0510   VLDL 21 01/06/2019 0510   LDLCALC 35 01/06/2019 0510   HgbA1c:  Lab Results  Component Value Date   HGBA1C 6.9 (H) 01/06/2019   Urine Drug Screen:     Component Value Date/Time   LABOPIA NONE DETECTED 01/05/2019 1406   COCAINSCRNUR NONE DETECTED 01/05/2019 1406   LABBENZ NONE DETECTED 01/05/2019 1406   AMPHETMU NONE DETECTED 01/05/2019 1406   THCU NONE DETECTED 01/05/2019 1406   LABBARB NONE DETECTED 01/05/2019 1406    Alcohol Level     Component Value Date/Time   ETH <10 01/05/2019 0559    IMAGING Ct Angio Head W Or Wo Contrast  Result Date: 01/05/2019 CLINICAL DATA:  Focal neuro deficit with stroke suspected EXAM: CT ANGIOGRAPHY HEAD AND NECK TECHNIQUE: Multidetector CT imaging of the head and neck was performed using the standard protocol during bolus administration of intravenous contrast. Multiplanar CT image reconstructions and MIPs were obtained to evaluate the vascular anatomy. Carotid stenosis measurements (when applicable) are obtained utilizing NASCET criteria, using the distal internal carotid diameter as the denominator.  CONTRAST:  38mL ISOVUE-370 IOPAMIDOL (ISOVUE-370) INJECTION 76% COMPARISON:  Noncontrast head CT from earlier today FINDINGS: CTA NECK FINDINGS Aortic arch: Atherosclerosis.  Three vessel branching. Right carotid system: Mild atherosclerotic plaque. No stenosis or ulceration. Left carotid system: Mild atherosclerotic plaque. No stenosis or ulceration. Vertebral arteries: Proximal subclavian atherosclerosis without flow limiting stenosis. A tubal tortuosity. Both vertebral arteries are widely patent to the dura. Skeleton: Negative Other neck: No acute finding. Upper chest: Layering right pleural effusion. Review of the MIP images confirms the above findings CTA HEAD FINDINGS Anterior circulation: Left M1 to M2 occlusion with acute appearance causing a proximal meniscus sign. There is downstream under filled reconstitution vessels. Atherosclerotic calcification of the carotid siphons. Posterior circulation: Symmetric vertebral arteries. The vertebral and basilar arteries are smooth and widely patent. No branch occlusion or flow limiting stenosis. Venous sinuses: Patent as permitted by contrast timing Anatomic variants: None significant. Delayed phase: No abnormal intracranial enhancement. Known mixed density subdural hematoma along the left cerebral convexity. Review of the MIP images confirms the above findings Critical Value/emergent results were called by telephone at the time of interpretation on 01/05/2019 at 7:53 am to Dr. Delora Fuel , who verbally acknowledged these results. IMPRESSION: 1. 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 There is underfilling of  the reconstituted downstream vessels. 2. Mixed density subdural hematoma along the left cerebral convexity, see preceding noncontrast head CT. 3. Limited atherosclerosis for age. No flow limiting stenosis or ulceration. Electronically Signed   By: Monte Fantasia M.D.   On: 01/05/2019 07:59   Dg Chest 2 View  Result Date: 01/05/2019 CLINICAL  DATA:  Initial evaluation for acute trauma, fall. EXAM: CHEST - 2 VIEW COMPARISON:  Prior radiograph from 09/02/2018 FINDINGS: There has been interval placement of a triple lead transvenous pacemaker/AICD. Cardiomegaly is relatively stable. Mediastinal silhouette within normal limits. Aortic atherosclerosis. Lungs hypoinflated. No focal infiltrates. No pulmonary edema. Small layering right pleural effusion seen posteriorly on lateral projection. No pneumothorax. No acute osseous finding. Multiple surgical clips overlie the left axilla. IMPRESSION: 1. Small layering right pleural effusion. 2. No other active cardiopulmonary disease. Electronically Signed   By: Jeannine Boga M.D.   On: 01/05/2019 06:15   Dg Clavicle Left  Result Date: 01/05/2019 CLINICAL DATA:  Initial evaluation for acute left clavicular pain status post fall. EXAM: LEFT CLAVICLE - 2+ VIEWS COMPARISON:  None. FINDINGS: No acute fracture or dislocation. Osteoarthritic changes present at the left [facility] joint. Pacemaker electrodes partially overlie the clavicle. Visualized left lung apex clear. No soft tissue abnormality. IMPRESSION: No acute osseous abnormality about the left clavicle. Electronically Signed   By: Jeannine Boga M.D.   On: 01/05/2019 06:17   Ct Head Wo Contrast  Result Date: 01/06/2019 CLINICAL DATA:  Follow up stroke. Status post endovascular revascularization of LEFT MCA occlusion. EXAM: CT HEAD WITHOUT CONTRAST TECHNIQUE: Contiguous axial images were obtained from the base of the skull through the vertex without intravenous contrast. COMPARISON:  None. FINDINGS: BRAIN: No intraparenchymal hemorrhage, mass effect nor midline shift. The ventricles and sulci are normal for age. Patchy supratentorial white matter hypodensities within normal range for patient's age, though non-specific are most compatible with chronic small vessel ischemic disease. No acute large vascular territory infarcts. Similar to decreased 8 mm  RIGHT holo hemispheric mixed density subdural hematoma. VASCULAR: Moderate calcific atherosclerosis of the carotid siphons. Asymmetrically dense LEFT MCA best appreciated on sagittal series. The SKULL: No skull fracture. No significant scalp soft tissue swelling. SINUSES/ORBITS: Paranasal sinuses are well aerated. Trace bilateral mastoid effusions. Included ocular globes and orbital contents are non-suspicious. Status post bilateral ocular lens implants. OTHER: None. IMPRESSION: 1. Dense LEFT MCA: Recurrent occlusion possible versus enhancing residual thrombus. 2. No acute infarct. 3. Similar to decreased mixed density LEFT holo hemispheric subdural hematoma. 4. These results will be called to the ordering clinician or representative by the professional radiologist assistant, and communication documented in [facility] Dashboard. Electronically Signed   By: Elon Alas M.D.   On: 01/06/2019 05:14   Ct Head Wo Contrast  Result Date: 01/05/2019 CLINICAL DATA:  83 year old female fell off toilet tonight. Garbled speech and mild facial droop. Initial encounter. EXAM: CT HEAD WITHOUT CONTRAST CT CERVICAL SPINE WITHOUT CONTRAST TECHNIQUE: Multidetector CT imaging of the head and cervical spine was performed following the standard protocol without intravenous contrast. Multiplanar CT image reconstructions of the cervical spine were also generated. COMPARISON:  None. FINDINGS: CT HEAD FINDINGS Brain: Complex broad-base left convexity subdural hematoma. Largest component appears hypodense (possibly chronic) measuring 10.1 mm maximal thickness. More acute appearing component (which is slightly hyperdense) measures up to 4.3 mm. Mild local mass effect upon adjacent frontal-parietal gyri. Minimal bowing of the septum to the right. Thin left tentorial subdural hematoma. Tubular hyperdensity along the course of the left  middle cerebral artery M1 segment may be related to thrombus or possibly tiny amount of blood. Chronic  microvascular changes. Global atrophy. Vascular: As above. Skull: No skull fracture noted. Sinuses/Orbits: No acute orbital abnormality. Visualized paranasal sinuses are clear. Other: Mastoid air cells and middle ear cavities are clear. CT CERVICAL SPINE FINDINGS Alignment: Slight scoliosis convex left. Minimal anterior slip C4 secondary to right-sided facet degenerative changes. Skull base and vertebrae: No cervical spine fracture. Nonspecific 7 mm right C4 and C6 vertebral body lucency Soft tissues and spinal canal: No abnormal prevertebral soft tissue swelling. Disc levels: Scattered cervical spondylotic changes, most notable C6-7. No high-grade spinal stenosis. Upper chest: No worrisome abnormality Other: No worrisome abnormality IMPRESSION: CT HEAD: 1. Complex broad-base left convexity subdural hematoma. Largest component appears hypodense (possibly chronic) measuring 10.1 mm maximal thickness. More acute appearing component (which is slightly hyperdense) measures up to 4.3 mm. Mild local mass effect upon adjacent frontal-parietal gyri. Minimal bowing of the septum to the right. No skull fracture. 2. Thin left tentorial subdural hematoma. 3. Tubular hyperdensity along the course of the left middle cerebral artery M1 segment may be related to thrombus or possibly tiny amount of blood. 4. Chronic microvascular changes. 5. Global atrophy. CT CERVICAL SPINE: 1. Slight scoliosis convex left. Minimal anterior slip C4 secondary to right-sided facet degenerative changes. 2. No cervical spine fracture noted. No abnormal prevertebral soft tissue swelling. 3. Nonspecific 7 mm right C4 and C6 vertebral body lucency. These results were called by telephone at the time of interpretation on 01/05/2019 at 6:01 am to Dr. Delora Fuel , who verbally acknowledged these results. Electronically Signed   By: Genia Del M.D.   On: 01/05/2019 06:19   Ct Angio Neck W And/or Wo Contrast  Result Date: 01/05/2019 CLINICAL DATA:  Focal  neuro deficit with stroke suspected EXAM: CT ANGIOGRAPHY HEAD AND NECK TECHNIQUE: Multidetector CT imaging of the head and neck was performed using the standard protocol during bolus administration of intravenous contrast. Multiplanar CT image reconstructions and MIPs were obtained to evaluate the vascular anatomy. Carotid stenosis measurements (when applicable) are obtained utilizing NASCET criteria, using the distal internal carotid diameter as the denominator. CONTRAST:  47mL ISOVUE-370 IOPAMIDOL (ISOVUE-370) INJECTION 76% COMPARISON:  Noncontrast head CT from earlier today FINDINGS: CTA NECK FINDINGS Aortic arch: Atherosclerosis.  Three vessel branching. Right carotid system: Mild atherosclerotic plaque. No stenosis or ulceration. Left carotid system: Mild atherosclerotic plaque. No stenosis or ulceration. Vertebral arteries: Proximal subclavian atherosclerosis without flow limiting stenosis. A tubal tortuosity. Both vertebral arteries are widely patent to the dura. Skeleton: Negative Other neck: No acute finding. Upper chest: Layering right pleural effusion. Review of the MIP images confirms the above findings CTA HEAD FINDINGS Anterior circulation: Left M1 to M2 occlusion with acute appearance causing a proximal meniscus sign. There is downstream under filled reconstitution vessels. Atherosclerotic calcification of the carotid siphons. Posterior circulation: Symmetric vertebral arteries. The vertebral and basilar arteries are smooth and widely patent. No branch occlusion or flow limiting stenosis. Venous sinuses: Patent as permitted by contrast timing Anatomic variants: None significant. Delayed phase: No abnormal intracranial enhancement. Known mixed density subdural hematoma along the left cerebral convexity. Review of the MIP images confirms the above findings Critical Value/emergent results were called by telephone at the time of interpretation on 01/05/2019 at 7:53 am to Dr. Delora Fuel , who verbally  acknowledged these results. IMPRESSION: 1. 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 There is underfilling of the reconstituted downstream vessels.  2. Mixed density subdural hematoma along the left cerebral convexity, see preceding noncontrast head CT. 3. Limited atherosclerosis for age. No flow limiting stenosis or ulceration. Electronically Signed   By: Monte Fantasia M.D.   On: 01/05/2019 07:59   Ct Cervical Spine Wo Contrast  Result Date: 01/05/2019 CLINICAL DATA:  83 year old female fell off toilet tonight. Garbled speech and mild facial droop. Initial encounter. EXAM: CT HEAD WITHOUT CONTRAST CT CERVICAL SPINE WITHOUT CONTRAST TECHNIQUE: Multidetector CT imaging of the head and cervical spine was performed following the standard protocol without intravenous contrast. Multiplanar CT image reconstructions of the cervical spine were also generated. COMPARISON:  None. FINDINGS: CT HEAD FINDINGS Brain: Complex broad-base left convexity subdural hematoma. Largest component appears hypodense (possibly chronic) measuring 10.1 mm maximal thickness. More acute appearing component (which is slightly hyperdense) measures up to 4.3 mm. Mild local mass effect upon adjacent frontal-parietal gyri. Minimal bowing of the septum to the right. Thin left tentorial subdural hematoma. Tubular hyperdensity along the course of the left middle cerebral artery M1 segment may be related to thrombus or possibly tiny amount of blood. Chronic microvascular changes. Global atrophy. Vascular: As above. Skull: No skull fracture noted. Sinuses/Orbits: No acute orbital abnormality. Visualized paranasal sinuses are clear. Other: Mastoid air cells and middle ear cavities are clear. CT CERVICAL SPINE FINDINGS Alignment: Slight scoliosis convex left. Minimal anterior slip C4 secondary to right-sided facet degenerative changes. Skull base and vertebrae: No cervical spine fracture. Nonspecific 7 mm right C4 and C6 vertebral body  lucency Soft tissues and spinal canal: No abnormal prevertebral soft tissue swelling. Disc levels: Scattered cervical spondylotic changes, most notable C6-7. No high-grade spinal stenosis. Upper chest: No worrisome abnormality Other: No worrisome abnormality IMPRESSION: CT HEAD: 1. Complex broad-base left convexity subdural hematoma. Largest component appears hypodense (possibly chronic) measuring 10.1 mm maximal thickness. More acute appearing component (which is slightly hyperdense) measures up to 4.3 mm. Mild local mass effect upon adjacent frontal-parietal gyri. Minimal bowing of the septum to the right. No skull fracture. 2. Thin left tentorial subdural hematoma. 3. Tubular hyperdensity along the course of the left middle cerebral artery M1 segment may be related to thrombus or possibly tiny amount of blood. 4. Chronic microvascular changes. 5. Global atrophy. CT CERVICAL SPINE: 1. Slight scoliosis convex left. Minimal anterior slip C4 secondary to right-sided facet degenerative changes. 2. No cervical spine fracture noted. No abnormal prevertebral soft tissue swelling. 3. Nonspecific 7 mm right C4 and C6 vertebral body lucency. These results were called by telephone at the time of interpretation on 01/05/2019 at 6:01 am to Dr. Delora Fuel , who verbally acknowledged these results. Electronically Signed   By: Genia Del M.D.   On: 01/05/2019 06:19   Cerebral angiogram 01/05/2019 S/P Lt common carotid arteriogram followed endovascular revascularization of Lt MCA M1 occlusion withx 2 passes with embotrap 16mm x 33 mm retriever and x 1 pass with the solitaire 75mm x 40 mm rertrieve achievinf a TICI 2b with reocclusion requiring x 2 balloon angioplasties with TICI 2 revascularization with reocclusion.  2D echocardiogram 01/05/2019 - Left ventricle: The cavity size was normal. Wall thickness was increased in a pattern of mild LVH. Systolic function was normal. The estimated ejection fraction was in the range  of 60% to 65%. Wall motion was normal; there were no regional wall motion abnormalities. Doppler parameters are consistent with abnormal left ventricular relaxation (grade 1 diastolic dysfunction). The E&'e&' ratio is >15, suggesting elevated LV filling pressure. - Mitral valve:  Calcified annulus. Mildly thickened leaflets . There was trivial regurgitation. - Left atrium: The atrium was normal in size. - Tricuspid valve: There was mild regurgitation. - Pulmonary arteries: PA peak pressure: 37 mm Hg (S). - Inferior vena cava: The vessel was normal in size. The respirophasic diameter changes were in the normal range (>= 50%), consistent with normal central venous pressure. Impressions:   LVEF 60-65%, mild LVH, normal wall motion, grade 1 DD, elevated LV filling pressure, trivial MR, normal LA size, mild TR, RVSP 37 mmHg, normal IVC.   PHYSICAL EXAM Per Dr. Leonie Man    ASSESSMENT/PLAN Stacy [name] is a 83 y.o. female with history of diabetes, hyperlipidemia, hypothyroidism, breast cancer, proximal atrial fibrillation on warfarin who had a fall 2 weeks ago and again the morning of admission presenting with right facial droop and speech difficulty.   Stroke:  left MCA infarct s/p IR w/ TICI2b reperfusion in setting of acute and chronic L SDH from falls. infarct embolic secondary to known AF on warfarinsmall vessel disease source  CT head broad-based left convexity SDH.  Maximum 10.1 mm thickness.  More acute section which is hyperdense up to 4.3 mm.  Mild local mass-effect.  Minimal bowing of the septum to the right.  No fracture.  Thin left tentorial SDH.  Tubular hypodensity L MCA M1 possible thrombus.  Small vessel disease.  Atrophy.  CT CS slight scoliosis to the left.  Right-sided facet degenerative changes.  No fracture.  Nonspecific 7 mm R C4 and C6 vertebral body lucency.  Clavicle x-ray negative for fracture   CTA head & neck ELVO L M1 thrombus.  Mixed density SDH left cerebral  convexity.  Cerebral angiogram endovascular TICI2b revascularization L M1 occlusion with 2 passes embotrap, 1 pass Solitaire.  Angioplasty x2 with continued TICI2b revascularization with reocclusion.  Stent considered but not placed due to subdural hemorrhage and need for dual antiplatelets for stent protection and patient already on warfarin  Repeat CT head in am 1/23   2D Echo  EF 60-65%. No source of embolus   EEG pending   LDL 35  HgbA1c 6.9  SCDs for VTE prophylaxis  warfarin daily prior to admission, now on No antithrombotic given SDH.   Therapy recommendations:  pending   Disposition:  pending   Femoral aline remains in. Need to d/c. Once bedrest completed, ok to be OOB  SDH  Fell 2 weeks ago just prior to pacer surgery per son had bruise on her head  [place] yesterday  SDH likely acute on chronic  followup CT in am  Atrial Fibrillation  Home anticoagulation:  warfarin daily   Warfarin on hold given SDH  Home meds: metoprolol 50 bid   slightly elevated troponin likely d/t AF - 0.03.  Hypertension   No previous dx  Home meds: metoprolol, lasix  On cleviprex post IR - BP goal per IR 24h post procedure  Echo ok  Hyperlipidemia  Home meds:  lipitor 20 and lovaza  LDL 35, goal < 70  Hold statin given hmg  Resume home at discharge  Diabetes type II  HgbA1c 6.9, at goal < 7.0  Other Stroke Risk Factors  Advanced age  Other Active Problems  Small layering right pleural effusion seen on chest x-  Hx L breast cancer  Hospital day # [place], MSN, APRN, ANVP-BC, AGPCNP-BC Advanced Practice Stroke Nurse [place] for Schedule & Pager information 01/06/2019 9:09 AM   To contact  Stroke Continuity provider, please refer to [email or web address] After hours, contact General Neurology

## 2019-01-06 NOTE — Progress Notes (Addendum)
STROKE TEAM PROGRESS NOTE   INTERVAL HISTORY Her husband and son are at the bedside.  Stacy [name] remains aphasic with dense right hemiplegia. Blood pressure is adequately controlled.left groin sheath remains in place.Patient's husband and son the bedside.  Vitals:   01/06/19 1230 01/06/19 1300 01/06/19 1330 01/06/19 1400  BP: (!) 124/51 (!) 123/57 (!) 123/55 (!) 120/52  Pulse: 70 70 69 70  Resp: (!) 22 15 (!) 23 (!) 21  Temp:      TempSrc:      SpO2: 98% 100% 100% 100%  Weight:    71.1 kg  Height:    5\' 5"  (1.651 m)    CBC:  Recent Labs  Lab 01/05/19 0559 01/06/19 0510  WBC 8.6 13.3*  NEUTROABS 6.8 10.0*  HGB 10.1* 8.0*  HCT 32.6* 25.7*  MCV 97.3 98.8  PLT 194 268    Basic Metabolic Panel:  Recent Labs  Lab 01/05/19 0559 01/06/19 0510  NA 140 140  K 3.6 3.6  CL 104 111  CO2 22 18*  GLUCOSE 112* 130*  BUN 8 20  CREATININE 1.01* 1.09*  CALCIUM 8.9 8.4*   Lipid Panel:     Component Value Date/Time   CHOL 87 01/06/2019 0510   TRIG 103 01/06/2019 0510   HDL 31 (L) 01/06/2019 0510   CHOLHDL 2.8 01/06/2019 0510   VLDL 21 01/06/2019 0510   LDLCALC 35 01/06/2019 0510   HgbA1c:  Lab Results  Component Value Date   HGBA1C 6.9 (H) 01/06/2019   Urine Drug Screen:     Component Value Date/Time   LABOPIA NONE DETECTED 01/05/2019 1406   COCAINSCRNUR NONE DETECTED 01/05/2019 1406   LABBENZ NONE DETECTED 01/05/2019 1406   AMPHETMU NONE DETECTED 01/05/2019 1406   THCU NONE DETECTED 01/05/2019 1406   LABBARB NONE DETECTED 01/05/2019 1406    Alcohol Level     Component Value Date/Time   ETH <10 01/05/2019 0559    IMAGING Ct Angio Head W Or Wo Contrast  Result Date: 01/05/2019 CLINICAL DATA:  Focal neuro deficit with stroke suspected EXAM: CT ANGIOGRAPHY HEAD AND NECK TECHNIQUE: Multidetector CT imaging of the head and neck was performed using the standard protocol during bolus administration of intravenous contrast. Multiplanar CT image reconstructions and MIPs  were obtained to evaluate the vascular anatomy. Carotid stenosis measurements (when applicable) are obtained utilizing NASCET criteria, using the distal internal carotid diameter as the denominator. CONTRAST:  21mL ISOVUE-370 IOPAMIDOL (ISOVUE-370) INJECTION 76% COMPARISON:  Noncontrast head CT from earlier today FINDINGS: CTA NECK FINDINGS Aortic arch: Atherosclerosis.  Three vessel branching. Right carotid system: Mild atherosclerotic plaque. No stenosis or ulceration. Left carotid system: Mild atherosclerotic plaque. No stenosis or ulceration. Vertebral arteries: Proximal subclavian atherosclerosis without flow limiting stenosis. A tubal tortuosity. Both vertebral arteries are widely patent to the dura. Skeleton: Negative Other neck: No acute finding. Upper chest: Layering right pleural effusion. Review of the MIP images confirms the above findings CTA HEAD FINDINGS Anterior circulation: Left M1 to M2 occlusion with acute appearance causing a proximal meniscus sign. There is downstream under filled reconstitution vessels. Atherosclerotic calcification of the carotid siphons. Posterior circulation: Symmetric vertebral arteries. The vertebral and basilar arteries are smooth and widely patent. No branch occlusion or flow limiting stenosis. Venous sinuses: Patent as permitted by contrast timing Anatomic variants: None significant. Delayed phase: No abnormal intracranial enhancement. Known mixed density subdural hematoma along the left cerebral convexity. Review of the MIP images confirms the above findings Critical Value/emergent results were called by  telephone at the time of interpretation on 01/05/2019 at 7:53 am to Dr. Delora Fuel , who verbally acknowledged these results. IMPRESSION: 1. 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 There is underfilling of the reconstituted downstream vessels. 2. Mixed density subdural hematoma along the left cerebral convexity, see preceding noncontrast head CT. 3.  Limited atherosclerosis for age. No flow limiting stenosis or ulceration. Electronically Signed   By: Monte Fantasia M.D.   On: 01/05/2019 07:59   Dg Chest 2 View  Result Date: 01/05/2019 CLINICAL DATA:  Initial evaluation for acute trauma, fall. EXAM: CHEST - 2 VIEW COMPARISON:  Prior radiograph from 09/02/2018 FINDINGS: There has been interval placement of a triple lead transvenous pacemaker/AICD. Cardiomegaly is relatively stable. Mediastinal silhouette within normal limits. Aortic atherosclerosis. Lungs hypoinflated. No focal infiltrates. No pulmonary edema. Small layering right pleural effusion seen posteriorly on lateral projection. No pneumothorax. No acute osseous finding. Multiple surgical clips overlie the left axilla. IMPRESSION: 1. Small layering right pleural effusion. 2. No other active cardiopulmonary disease. Electronically Signed   By: Jeannine Boga M.D.   On: 01/05/2019 06:15   Dg Clavicle Left  Result Date: 01/05/2019 CLINICAL DATA:  Initial evaluation for acute left clavicular pain status post fall. EXAM: LEFT CLAVICLE - 2+ VIEWS COMPARISON:  None. FINDINGS: No acute fracture or dislocation. Osteoarthritic changes present at the left [facility] joint. Pacemaker electrodes partially overlie the clavicle. Visualized left lung apex clear. No soft tissue abnormality. IMPRESSION: No acute osseous abnormality about the left clavicle. Electronically Signed   By: Jeannine Boga M.D.   On: 01/05/2019 06:17   Ct Head Wo Contrast  Result Date: 01/06/2019 CLINICAL DATA:  Follow up stroke. Status post endovascular revascularization of LEFT MCA occlusion. EXAM: CT HEAD WITHOUT CONTRAST TECHNIQUE: Contiguous axial images were obtained from the base of the skull through the vertex without intravenous contrast. COMPARISON:  None. FINDINGS: BRAIN: No intraparenchymal hemorrhage, mass effect nor midline shift. The ventricles and sulci are normal for age. Patchy supratentorial white matter  hypodensities within normal range for patient's age, though non-specific are most compatible with chronic small vessel ischemic disease. No acute large vascular territory infarcts. Similar to decreased 8 mm RIGHT holo hemispheric mixed density subdural hematoma. VASCULAR: Moderate calcific atherosclerosis of the carotid siphons. Asymmetrically dense LEFT MCA best appreciated on sagittal series. The SKULL: No skull fracture. No significant scalp soft tissue swelling. SINUSES/ORBITS: Paranasal sinuses are well aerated. Trace bilateral mastoid effusions. Included ocular globes and orbital contents are non-suspicious. Status post bilateral ocular lens implants. OTHER: None. IMPRESSION: 1. Dense LEFT MCA: Recurrent occlusion possible versus enhancing residual thrombus. 2. No acute infarct. 3. Similar to decreased mixed density LEFT holo hemispheric subdural hematoma. 4. These results will be called to the ordering clinician or representative by the professional radiologist assistant, and communication documented in [employer]board. Electronically Signed   By: Elon Alas M.D.   On: 01/06/2019 05:14   Ct Head Wo Contrast  Result Date: 01/05/2019 CLINICAL DATA:  83 year old female fell off toilet tonight. Garbled speech and mild facial droop. Initial encounter. EXAM: CT HEAD WITHOUT CONTRAST CT CERVICAL SPINE WITHOUT CONTRAST TECHNIQUE: Multidetector CT imaging of the head and cervical spine was performed following the standard protocol without intravenous contrast. Multiplanar CT image reconstructions of the cervical spine were also generated. COMPARISON:  None. FINDINGS: CT HEAD FINDINGS Brain: Complex broad-base left convexity subdural hematoma. Largest component appears hypodense (possibly chronic) measuring 10.1 mm maximal thickness. More acute appearing component (which is slightly  hyperdense) measures up to 4.3 mm. Mild local mass effect upon adjacent frontal-parietal gyri. Minimal bowing of the septum  to the right. Thin left tentorial subdural hematoma. Tubular hyperdensity along the course of the left middle cerebral artery M1 segment may be related to thrombus or possibly tiny amount of blood. Chronic microvascular changes. Global atrophy. Vascular: As above. Skull: No skull fracture noted. Sinuses/Orbits: No acute orbital abnormality. Visualized paranasal sinuses are clear. Other: Mastoid air cells and middle ear cavities are clear. CT CERVICAL SPINE FINDINGS Alignment: Slight scoliosis convex left. Minimal anterior slip C4 secondary to right-sided facet degenerative changes. Skull base and vertebrae: No cervical spine fracture. Nonspecific 7 mm right C4 and C6 vertebral body lucency Soft tissues and spinal canal: No abnormal prevertebral soft tissue swelling. Disc levels: Scattered cervical spondylotic changes, most notable C6-7. No high-grade spinal stenosis. Upper chest: No worrisome abnormality Other: No worrisome abnormality IMPRESSION: CT HEAD: 1. Complex broad-base left convexity subdural hematoma. Largest component appears hypodense (possibly chronic) measuring 10.1 mm maximal thickness. More acute appearing component (which is slightly hyperdense) measures up to 4.3 mm. Mild local mass effect upon adjacent frontal-parietal gyri. Minimal bowing of the septum to the right. No skull fracture. 2. Thin left tentorial subdural hematoma. 3. Tubular hyperdensity along the course of the left middle cerebral artery M1 segment may be related to thrombus or possibly tiny amount of blood. 4. Chronic microvascular changes. 5. Global atrophy. CT CERVICAL SPINE: 1. Slight scoliosis convex left. Minimal anterior slip C4 secondary to right-sided facet degenerative changes. 2. No cervical spine fracture noted. No abnormal prevertebral soft tissue swelling. 3. Nonspecific 7 mm right C4 and C6 vertebral body lucency. These results were called by telephone at the time of interpretation on 01/05/2019 at 6:01 am to Dr. Delora Fuel , who verbally acknowledged these results. Electronically Signed   By: Genia Del M.D.   On: 01/05/2019 06:19   Ct Angio Neck W And/or Wo Contrast  Result Date: 01/05/2019 CLINICAL DATA:  Focal neuro deficit with stroke suspected EXAM: CT ANGIOGRAPHY HEAD AND NECK TECHNIQUE: Multidetector CT imaging of the head and neck was performed using the standard protocol during bolus administration of intravenous contrast. Multiplanar CT image reconstructions and MIPs were obtained to evaluate the vascular anatomy. Carotid stenosis measurements (when applicable) are obtained utilizing NASCET criteria, using the distal internal carotid diameter as the denominator. CONTRAST:  36mL ISOVUE-370 IOPAMIDOL (ISOVUE-370) INJECTION 76% COMPARISON:  Noncontrast head CT from earlier today FINDINGS: CTA NECK FINDINGS Aortic arch: Atherosclerosis.  Three vessel branching. Right carotid system: Mild atherosclerotic plaque. No stenosis or ulceration. Left carotid system: Mild atherosclerotic plaque. No stenosis or ulceration. Vertebral arteries: Proximal subclavian atherosclerosis without flow limiting stenosis. A tubal tortuosity. Both vertebral arteries are widely patent to the dura. Skeleton: Negative Other neck: No acute finding. Upper chest: Layering right pleural effusion. Review of the MIP images confirms the above findings CTA HEAD FINDINGS Anterior circulation: Left M1 to M2 occlusion with acute appearance causing a proximal meniscus sign. There is downstream under filled reconstitution vessels. Atherosclerotic calcification of the carotid siphons. Posterior circulation: Symmetric vertebral arteries. The vertebral and basilar arteries are smooth and widely patent. No branch occlusion or flow limiting stenosis. Venous sinuses: Patent as permitted by contrast timing Anatomic variants: None significant. Delayed phase: No abnormal intracranial enhancement. Known mixed density subdural hematoma along the left cerebral  convexity. Review of the MIP images confirms the above findings Critical Value/emergent results were called by telephone at the time  of interpretation on 01/05/2019 at 7:53 am to Dr. Delora Fuel , who verbally acknowledged these results. IMPRESSION: 1. 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 There is underfilling of the reconstituted downstream vessels. 2. Mixed density subdural hematoma along the left cerebral convexity, see preceding noncontrast head CT. 3. Limited atherosclerosis for age. No flow limiting stenosis or ulceration. Electronically Signed   By: Monte Fantasia M.D.   On: 01/05/2019 07:59   Ct Cervical Spine Wo Contrast  Result Date: 01/05/2019 CLINICAL DATA:  83 year old female fell off toilet tonight. Garbled speech and mild facial droop. Initial encounter. EXAM: CT HEAD WITHOUT CONTRAST CT CERVICAL SPINE WITHOUT CONTRAST TECHNIQUE: Multidetector CT imaging of the head and cervical spine was performed following the standard protocol without intravenous contrast. Multiplanar CT image reconstructions of the cervical spine were also generated. COMPARISON:  None. FINDINGS: CT HEAD FINDINGS Brain: Complex broad-base left convexity subdural hematoma. Largest component appears hypodense (possibly chronic) measuring 10.1 mm maximal thickness. More acute appearing component (which is slightly hyperdense) measures up to 4.3 mm. Mild local mass effect upon adjacent frontal-parietal gyri. Minimal bowing of the septum to the right. Thin left tentorial subdural hematoma. Tubular hyperdensity along the course of the left middle cerebral artery M1 segment may be related to thrombus or possibly tiny amount of blood. Chronic microvascular changes. Global atrophy. Vascular: As above. Skull: No skull fracture noted. Sinuses/Orbits: No acute orbital abnormality. Visualized paranasal sinuses are clear. Other: Mastoid air cells and middle ear cavities are clear. CT CERVICAL SPINE FINDINGS Alignment:  Slight scoliosis convex left. Minimal anterior slip C4 secondary to right-sided facet degenerative changes. Skull base and vertebrae: No cervical spine fracture. Nonspecific 7 mm right C4 and C6 vertebral body lucency Soft tissues and spinal canal: No abnormal prevertebral soft tissue swelling. Disc levels: Scattered cervical spondylotic changes, most notable C6-7. No high-grade spinal stenosis. Upper chest: No worrisome abnormality Other: No worrisome abnormality IMPRESSION: CT HEAD: 1. Complex broad-base left convexity subdural hematoma. Largest component appears hypodense (possibly chronic) measuring 10.1 mm maximal thickness. More acute appearing component (which is slightly hyperdense) measures up to 4.3 mm. Mild local mass effect upon adjacent frontal-parietal gyri. Minimal bowing of the septum to the right. No skull fracture. 2. Thin left tentorial subdural hematoma. 3. Tubular hyperdensity along the course of the left middle cerebral artery M1 segment may be related to thrombus or possibly tiny amount of blood. 4. Chronic microvascular changes. 5. Global atrophy. CT CERVICAL SPINE: 1. Slight scoliosis convex left. Minimal anterior slip C4 secondary to right-sided facet degenerative changes. 2. No cervical spine fracture noted. No abnormal prevertebral soft tissue swelling. 3. Nonspecific 7 mm right C4 and C6 vertebral body lucency. These results were called by telephone at the time of interpretation on 01/05/2019 at 6:01 am to Dr. Delora Fuel , who verbally acknowledged these results. Electronically Signed   By: Genia Del M.D.   On: 01/05/2019 06:19   Cerebral angiogram 01/05/2019 S/P Lt common carotid arteriogram followed endovascular revascularization of Lt MCA M1 occlusion withx 2 passes with embotrap 31mm x 33 mm retriever and x 1 pass with the solitaire 52mm x 40 mm rertrieve achievinf a TICI 2b with reocclusion requiring x 2 balloon angioplasties with TICI 2 revascularization with  reocclusion.  2D echocardiogram 01/05/2019 - Left ventricle: The cavity size was normal. Wall thickness was increased in a pattern of mild LVH. Systolic function was normal. The estimated ejection fraction was in the range of 60% to 65%. Wall motion  was normal; there were no regional wall motion abnormalities. Doppler parameters are consistent with abnormal left ventricular relaxation (grade 1 diastolic dysfunction). The E&'e&' ratio is >15, suggesting elevated LV filling pressure. - Mitral valve: Calcified annulus. Mildly thickened leaflets . There was trivial regurgitation. - Left atrium: The atrium was normal in size. - Tricuspid valve: There was mild regurgitation. - Pulmonary arteries: PA peak pressure: 37 mm Hg (S). - Inferior vena cava: The vessel was normal in size. The respirophasic diameter changes were in the normal range (>= 50%), consistent with normal central venous pressure. Impressions:   LVEF 60-65%, mild LVH, normal wall motion, grade 1 DD, elevated LV filling pressure, trivial MR, normal LA size, mild TR, RVSP 37 mmHg, normal IVC.   PHYSICAL EXAM Frail elderly Caucasian lady currently not in distress. She has a left groin arterial sheath in place.  . Afebrile. Head is nontraumatic. Neck is supple without bruit.    Cardiac exam no murmur or gallop. Lungs are clear to auscultation. Distal pulses are well felt. Neurological Exam :  Awake alert but globally aphasic. Not following any commands.Left gaze preference and cannot look to the right past midline. Pupils 3 mm sluggishly reactive. Fundi not visualized. She has a weak cough and gag. Tongue midline. Spontaneous antigravity movements on the left side.dense right hemiplegia with only trace withdrawal to nail bed pressure in the right arm and leg. Flaccid hypotonia in the right and normal tone on the left. Right plantar upgoing left downgoing. ASSESSMENT/PLAN Stacy [name] is a 82 y.o. female with history of diabetes,  hyperlipidemia, hypothyroidism, breast cancer, proximal atrial fibrillation on warfarin who had a fall 2 weeks ago and again the morning of admission presenting with right facial droop and speech difficulty.    Stroke:  left MCA infarct due to left M1 occlusion status post failed mechanical thrombectomy and balloon angioplasty. Stent not deployed due to concurrent subdural hematoma and increased risk of hemorrhage  CT head broad-based left convexity SDH.  Maximum 10.1 mm thickness.  More acute section which is hyperdense up to 4.3 mm.  Mild local mass-effect.  Minimal bowing of the septum to the right.  No fracture.  Thin left tentorial SDH.  Tubular hypodensity L MCA M1 possible thrombus.  Small vessel disease.  Atrophy.  CT CS slight scoliosis to the left.  Right-sided facet degenerative changes.  No fracture.  Nonspecific 7 mm R C4 and C6 vertebral body lucency.  Clavicle x-ray negative for fracture   CTA head & neck ELVO L M1 thrombus.  Mixed density SDH left cerebral convexity.  Cerebral angiogram endovascular TICI2b revascularization L M1 occlusion with 2 passes embotrap, 1 pass Solitaire.  Angioplasty x2 with continued TICI2b revascularization with reocclusion.  Stent considered but not placed due to subdural hemorrhage and need for dual antiplatelets for stent protection and patient already on warfarin  Repeat CT head in am 1/23   2D Echo  EF 60-65%. No source of embolus   EEG pending   LDL 35  HgbA1c 6.9  SCDs for VTE prophylaxis  warfarin daily prior to admission, now on No antithrombotic given SDH.   Therapy recommendations:  pending   Disposition:  pending   Femoral aline remains in. Need to d/c. Once bedrest completed, ok to be OOB  SDH  Fell 2 weeks ago just prior to pacer surgery per son had bruise on her head  [place] yesterday  SDH likely acute on chronic  followup CT in am  Atrial  Fibrillation  Home anticoagulation:  warfarin daily   Warfarin on hold  given SDH  Home meds: metoprolol 50 bid   slightly elevated troponin likely d/t AF - 0.03.  Hypertension   No previous dx  Home meds: metoprolol, lasix  On cleviprex post IR - BP goal per IR 24h post procedure  Echo ok  Hyperlipidemia  Home meds:  lipitor 20 and lovaza  LDL 35, goal < 70  Hold statin given hmg  Resume home at discharge  Diabetes type II  HgbA1c 6.9, at goal < 7.0  Other Stroke Risk Factors  Advanced age  Other Active Problems  Small layering right pleural effusion seen on chest x-  Hx L breast cancer  Hospital day # [place], MSN, APRN, ANVP-BC, AGPCNP-BC Advanced Practice Stroke Nurse [place] for Schedule & Pager information 01/06/2019 3:08 PM  I have personally obtained history,examined this patient, reviewed notes, independently viewed imaging studies, participated in medical decision making and plan of care.ROS completed by me personally and pertinent positives fully documented  I have made any additions or clarifications directly to the above note. Agree with note above. She presented with aphasia and right hemiplegia secondary to left middle cerebral artery occlusion from atrial fibrillation despite being on warfarin but with suboptimal INR of 1.59. Mechanical thrombectomy and balloon angioplasties were attempted but unsuccessful. Stent was not attempted due to concurrent subdural hematoma and increased risk of hemorrhage. Unfortunately she is will not do well but the patient's husband feels that he had a similar poor prognosis from a stroke several years ago and he made significant recovery but his realistic and realizes she would not like to live like this and agrees to DO NOT RESUSCITATE but would like to support him for a few days and hope for improvement. Recommend permissive hypertension with systolic blood pressure goal 120-180. Repeat CT scan of the head tomorrow. Check EEG to look for seizure activity has  her neurological deficits seem out of proportion to the CT scan findings.Physical occupational speech therapy consults.This patient is critically ill and at significant risk of neurological worsening, death and care requires constant monitoring of vital signs, hemodynamics,respiratory and cardiac monitoring, extensive review of multiple databases, frequent neurological assessment, discussion with family, other specialists and medical decision making of high complexity.I have made any additions or clarifications directly to the above note.This critical care time does not reflect procedure time, or teaching time or supervisory time of PA/NP/Med Resident etc but could involve care discussion time.  I spent 40 minutes of neurocritical care time  in the care of  this patient.      Antony Contras, MD Medical Director [facility] Pager: [phone] 01/06/2019 3:12 PM  To contact Stroke Continuity provider, please refer to [email or web address] After hours, contact General Neurology

## 2019-01-07 ENCOUNTER — Inpatient Hospital Stay (HOSPITAL_COMMUNITY): Payer: Medicare Other

## 2019-01-07 DIAGNOSIS — I482 Chronic atrial fibrillation, unspecified: Secondary | ICD-10-CM

## 2019-01-07 DIAGNOSIS — E119 Type 2 diabetes mellitus without complications: Secondary | ICD-10-CM

## 2019-01-07 DIAGNOSIS — S065X9A Traumatic subdural hemorrhage with loss of consciousness of unspecified duration, initial encounter: Secondary | ICD-10-CM

## 2019-01-07 LAB — GLUCOSE, CAPILLARY
Glucose-Capillary: 161 mg/dL — ABNORMAL HIGH (ref 70–99)
Glucose-Capillary: 155 mg/dL — ABNORMAL HIGH (ref 70–99)
Glucose-Capillary: 152 mg/dL — ABNORMAL HIGH (ref 70–99)
Glucose-Capillary: 142 mg/dL — ABNORMAL HIGH (ref 70–99)
Glucose-Capillary: 139 mg/dL — ABNORMAL HIGH (ref 70–99)

## 2019-01-07 LAB — BASIC METABOLIC PANEL
Anion gap: 13 (ref 5–15)
BUN: 20 mg/dL (ref 8–23)
CO2: 16 mmol/L — AB (ref 22–32)
Calcium: 8.3 mg/dL — ABNORMAL LOW (ref 8.9–10.3)
Chloride: 112 mmol/L — ABNORMAL HIGH (ref 98–111)
Creatinine, Ser: 1.01 mg/dL — ABNORMAL HIGH (ref 0.44–1.00)
GFR calc Af Amer: >60 mL/min (ref >60)
GFR calc non Af Amer: 53 mL/min — ABNORMAL LOW (ref >60)
Glucose, Bld: 177 mg/dL — ABNORMAL HIGH (ref 70–99)
Potassium: 3.7 mmol/L (ref 3.5–5.1)
Sodium: 141 mmol/L (ref 135–145)

## 2019-01-07 LAB — CBC
HCT: 26.8 % — ABNORMAL LOW (ref 36.0–46.0)
HEMOGLOBIN: 8.3 g/dL — AB (ref 12.0–15.0)
MCH: 31 pg (ref 26.0–34.0)
MCHC: 31 g/dL (ref 30.0–36.0)
MCV: 100 fL (ref 80.0–100.0)
Platelets: 224 10*3/uL (ref 150–400)
RBC: 2.68 MIL/uL — ABNORMAL LOW (ref 3.87–5.11)
RDW: 14.9 % (ref 11.5–15.5)
WBC: 11.4 10*3/uL — ABNORMAL HIGH (ref 4.0–10.5)
nRBC: 0 % (ref 0.0–0.2)

## 2019-01-07 LAB — AMMONIA: AMMONIA: 14 umol/L (ref 9–35)

## 2019-01-07 MED ORDER — WHITE PETROLATUM EX OINT
TOPICAL_OINTMENT | CUTANEOUS | Status: AC
  Administered 2019-01-07: 0.2
  Filled 2019-01-07: qty 28.35

## 2019-01-07 NOTE — Progress Notes (Signed)
PT Cancellation Note  Patient Details Name: DANAYE SOBH MRN: [id number] DOB: 1936-06-26   Cancelled Treatment:    Reason Eval/Treat Not Completed: Other (comment)(pt awaiting transfer to 3w and RN deferred at this time)   Sandy Salaam Everhett Bozard 01/07/2019, 11:46 AM  [place] Pager: [phone] Office: [phone]

## 2019-01-07 NOTE — Progress Notes (Signed)
OT Cancellation Note  Patient Details Name: Stacy [name] MRN: [id number] DOB: 12/19/1935   Cancelled Treatment:    Reason Eval/Treat Not Completed: Patient at procedure or test/ unavailable. Having EEG.   Ramond Dial, OT/L   Acute OT Clinical Specialist Acute Rehabilitation Services Pager [phone] Office [phone]  01/07/2019, 3:03 PM

## 2019-01-07 NOTE — Progress Notes (Signed)
STROKE TEAM PROGRESS NOTE   INTERVAL HISTORY Stacy [name] and Stacy [name] are at the bedside.  She remains aphasic with dense right hemiplegia. Blood pressure is adequately controlled .Patient's [name] and Stacy [name] the bedside.repeat CT scan yesterday again did not show any large left MCA infarct.  Vitals:   01/07/19 0900 01/07/19 1000 01/07/19 1158 01/07/19 1233  BP: (!) 128/59   135/60  Pulse: 70 69  70  Resp: (!) 23 (!) 22  18  Temp:   99.4 F (37.4 C) 99 F (37.2 C)  TempSrc:   Axillary Axillary  SpO2: 100% 100%  97%  Weight:      Height:        CBC:  Recent Labs  Lab 01/05/19 0559 01/06/19 0510 01/07/19 0454  WBC 8.6 13.3* 11.4*  NEUTROABS 6.8 10.0*  --   HGB 10.1* 8.0* 8.3*  HCT 32.6* 25.7* 26.8*  MCV 97.3 98.8 100.0  PLT 194 233 517    Basic Metabolic Panel:  Recent Labs  Lab 01/06/19 0510 01/07/19 0454  NA 140 141  K 3.6 3.7  CL 111 112*  CO2 18* 16*  GLUCOSE 130* 177*  BUN 20 20  CREATININE 1.09* 1.01*  CALCIUM 8.4* 8.3*   Lipid Panel:     Component Value Date/Time   CHOL 87 01/06/2019 0510   TRIG 103 01/06/2019 0510   HDL 31 (L) 01/06/2019 0510   CHOLHDL 2.8 01/06/2019 0510   VLDL 21 01/06/2019 0510   LDLCALC 35 01/06/2019 0510   HgbA1c:  Lab Results  Component Value Date   HGBA1C 6.9 (H) 01/06/2019   Urine Drug Screen:     Component Value Date/Time   LABOPIA NONE DETECTED 01/05/2019 1406   COCAINSCRNUR NONE DETECTED 01/05/2019 1406   LABBENZ NONE DETECTED 01/05/2019 1406   AMPHETMU NONE DETECTED 01/05/2019 1406   THCU NONE DETECTED 01/05/2019 1406   LABBARB NONE DETECTED 01/05/2019 1406    Alcohol Level     Component Value Date/Time   ETH <10 01/05/2019 0559    IMAGING Ct Head Wo Contrast  Addendum Date: 01/07/2019   ADDENDUM REPORT: 01/07/2019 04:46 ADDENDUM: Correction: FINDINGS: Similar to decreased 8 mm LEFT (not RIGHT) holo hemispheric mixed density subdural hematoma. Electronically Signed   By: Elon Alas M.D.   On:  01/07/2019 04:46   Result Date: 01/07/2019 CLINICAL DATA:  Follow up stroke. Status post endovascular revascularization of LEFT MCA occlusion. EXAM: CT HEAD WITHOUT CONTRAST TECHNIQUE: Contiguous axial images were obtained from the base of the skull through the vertex without intravenous contrast. COMPARISON:  None. FINDINGS: BRAIN: No intraparenchymal hemorrhage, mass effect nor midline shift. The ventricles and sulci are normal for age. Patchy supratentorial white matter hypodensities within normal range for patient's age, though non-specific are most compatible with chronic small vessel ischemic disease. No acute large vascular territory infarcts. Similar to decreased 8 mm RIGHT holo hemispheric mixed density subdural hematoma. VASCULAR: Moderate calcific atherosclerosis of the carotid siphons. Asymmetrically dense LEFT MCA best appreciated on sagittal series. The SKULL: No skull fracture. No significant scalp soft tissue swelling. SINUSES/ORBITS: Paranasal sinuses are well aerated. Trace bilateral mastoid effusions. Included ocular globes and orbital contents are non-suspicious. Status post bilateral ocular lens implants. OTHER: None. IMPRESSION: 1. Dense LEFT MCA: Recurrent occlusion possible versus enhancing residual thrombus. 2. No acute infarct. 3. Similar to decreased mixed density LEFT holo hemispheric subdural hematoma. 4. These results will be called to the ordering clinician or representative by the professional radiologist assistant, and communication documented  in [employer] Dashboard. Electronically Signed: By: Elon Alas M.D. On: 01/06/2019 05:14   Ct Head Wo Contrast  Result Date: 01/07/2019 CLINICAL DATA:  Follow up stroke. Status post endovascular revascularization of LEFT MCA occlusion history of breast cancer. EXAM: CT HEAD WITHOUT CONTRAST TECHNIQUE: Contiguous axial images were obtained from the base of the skull through the vertex without intravenous contrast. COMPARISON:  CT HEAD  January 06, 2018. FINDINGS: BRAIN: No intraparenchymal hemorrhage, mass effect nor midline shift. The ventricles and sulci are normal for age. Patchy supratentorial white matter hypodensities compatible with chronic small vessel ischemic changes. No acute large vascular territory infarcts. Stable 8 mm mixed density LEFT holo hemispheric subdural hematoma. Basal cisterns are patent. VASCULAR: Moderate calcific atherosclerosis of the carotid siphons. SKULL: No skull fracture. No significant scalp soft tissue swelling. SINUSES/ORBITS: Trace paranasal sinus mucosal thickening. Minimal mastoid effusions. Included ocular globes and orbital contents are non-suspicious. Status post bilateral ocular lens implants. OTHER: None. IMPRESSION: 1. No acute intracranial process. 2. Stable 8 mm mixed density LEFT holo hemispheric subdural hematoma. Electronically Signed   By: Elon Alas M.D.   On: 01/07/2019 04:45   Cerebral angiogram 01/05/2019 S/P Lt common carotid arteriogram followed endovascular revascularization of Lt MCA M1 occlusion withx 2 passes with embotrap 57mm x 33 mm retriever and x 1 pass with the solitaire 48mm x 40 mm rertrieve achievinf a TICI 2b with reocclusion requiring x 2 balloon angioplasties with TICI 2 revascularization with reocclusion.  2D echocardiogram 01/05/2019 - Left ventricle: The cavity size was normal. Wall thickness was increased in a pattern of mild LVH. Systolic function was normal. The estimated ejection fraction was in the range of 60% to 65%. Wall motion was normal; there were no regional wall motion abnormalities. Doppler parameters are consistent with abnormal left ventricular relaxation (grade 1 diastolic dysfunction). The E&'e&' ratio is >15, suggesting elevated LV filling pressure. - Mitral valve: Calcified annulus. Mildly thickened leaflets . There was trivial regurgitation. - Left atrium: The atrium was normal in size. - Tricuspid valve: There was mild regurgitation. -  Pulmonary arteries: PA peak pressure: 37 mm Hg (S). - Inferior vena cava: The vessel was normal in size. The respirophasic diameter changes were in the normal range (>= 50%), consistent with normal central venous pressure. Impressions:   LVEF 60-65%, mild LVH, normal wall motion, grade 1 DD, elevated LV filling pressure, trivial MR, normal LA size, mild TR, RVSP 37 mmHg, normal IVC.   PHYSICAL EXAM Frail elderly Caucasian lady currently not in distress.  .  . Afebrile. Head is nontraumatic. Neck is supple without bruit.    Cardiac exam no murmur or gallop. Lungs are clear to auscultation. Distal pulses are well felt. Neurological Exam :  Awake alert but globally aphasic. Not following any commands.Left gaze preference and cannot look to the right past midline. Pupils 3 mm sluggishly reactive. Fundi not visualized. She has a weak cough and gag. Tongue midline. Spontaneous antigravity movements on the left side.dense right hemiplegia with only trace withdrawal to nail bed pressure in the right arm and leg. Flaccid hypotonia in the right and normal tone on the left. Right plantar upgoing left downgoing. ASSESSMENT/PLAN Stacy [name] is a 83 y.o. female with history of diabetes, hyperlipidemia, hypothyroidism, breast cancer, proximal atrial fibrillation on warfarin who had a fall 2 weeks ago and again the morning of admission presenting with right facial droop and speech difficulty.    Stroke:  left MCA infarct due to left M1  occlusion status post failed mechanical thrombectomy and balloon angioplasty. Stent not deployed due to concurrent subdural hematoma and increased risk of hemorrhage.however stroke not seen on CT scan 3 which is unusual. Another possibility could be postictal prolonged state following seizure due to subdural but EEG did not show seizure activity and plan to repeat 01/08/19  CT head broad-based left convexity SDH.  Maximum 10.1 mm thickness.  More acute section which is  hyperdense up to 4.3 mm.  Mild local mass-effect.  Minimal bowing of the septum to the right.  No fracture.  Thin left tentorial SDH.  Tubular hypodensity L MCA M1 possible thrombus.  Small vessel disease.  Atrophy.  CT CS slight scoliosis to the left.  Right-sided facet degenerative changes.  No fracture.  Nonspecific 7 mm R C4 and C6 vertebral body lucency.  Clavicle x-ray negative for fracture   CTA head & neck ELVO L M1 thrombus.  Mixed density SDH left cerebral convexity.  Cerebral angiogram endovascular TICI2b revascularization L M1 occlusion with 2 passes embotrap, 1 pass Solitaire.  Angioplasty x2 with continued TICI2b revascularization with reocclusion.  Stent considered but not placed due to subdural hemorrhage and need for dual antiplatelets for stent protection and patient already on warfarin  Repeat CT head in am 1/23   2D Echo  EF 60-65%. No source of embolus   EEG 01/05/19 nonspecific focal left hemispheric disturbance in the setting of a generalized nonspecific cerebral dysfunction (encephalopathy). There was no seizure or seizure predisposition   LDL 35  HgbA1c 6.9  SCDs for VTE prophylaxis  warfarin daily prior to admission, now on No antithrombotic given SDH.   Therapy recommendations:  pending   Disposition:  pending   Femoral aline remains in. Need to d/c. Once bedrest completed, ok to be OOB  SDH  Fell 2 weeks ago just prior to pacer surgery per Stacy [name] had bruise on Stacy head  [address] yesterday  SDH likely acute on chronic  followup CT in am  Atrial Fibrillation  Home anticoagulation:  warfarin daily   Warfarin on hold given SDH  Home meds: metoprolol 50 bid   slightly elevated troponin likely d/t AF - 0.03.  Hypertension   No previous dx  Home meds: metoprolol, lasix  On cleviprex post IR - BP goal per IR 24h post procedure  Echo ok  Hyperlipidemia  Home meds:  lipitor 20 and lovaza  LDL 35, goal < 70  Hold statin given hmg  Resume  home at discharge  Diabetes type II  HgbA1c 6.9, at goal < 7.0  Other Stroke Risk Factors  Advanced age  Other Active Problems  Small layering right pleural effusion seen on chest x-  Hx L breast cancer  Hospital day # 2    She presented with aphasia and right hemiplegia secondary to left middle cerebral artery occlusion from atrial fibrillation despite being on warfarin but with suboptimal INR of 1.59. Mechanical thrombectomy and balloon angioplasties were attempted but unsuccessful. Stent was not attempted due to concurrent subdural hematoma and increased risk of hemorrhage. Unfortunately she is will not do well but the patient's [name] feels that he had a similar poor prognosis from a stroke several years ago and he made significant recovery but his realistic and realizes she would not like to live like this and agrees to DO NOT RESUSCITATE but would like to support him for a few days and hope for improvement. Recommend permissive hypertension with systolic blood pressure goal 120-180. Repeat CT scan of  the head x 2 shows no definite left MCA infarct.Check EEG to look for seizure activity has Stacy neurological deficits seem out of proportion to the CT scan findings. This patient is critically ill and at significant risk of neurological worsening, death and care requires constant monitoring of vital signs, hemodynamics,respiratory and cardiac monitoring, extensive review of multiple databases, frequent neurological assessment, discussion with family, other specialists and medical decision making of high complexity.I have made any additions or clarifications directly to the above note.This critical care time does not reflect procedure time, or teaching time or supervisory time of PA/NP/Med Resident etc but could involve care discussion time.  I spent 30 minutes of neurocritical care time  in the care of  this patient. Plan transfer to neurology floor bed today. Repeat EEG study. If unyielding  may consider checking CT perfusion scan. Check ammonia levels given slightly abnormal LFTs.long discussion with the patient and Stacy [name] at the bedside and answered questions.     Antony Contras, MD Medical Director [facility] Pager: [phone] 01/07/2019 2:55 PM  To contact Stroke Continuity provider, please refer to [email or web address] After hours, contact General Neurology

## 2019-01-07 NOTE — Progress Notes (Signed)
SLP Cancellation Note  Patient Details Name: Stacy [name] MRN: [id number] DOB: [birth year]   Cancelled treatment:       Reason Eval/Treat Not Completed: Patient at procedure or test/unavailable Pt with nursing and is about to transferred to 3W. Nursing reported that she has not been opening her mouth for staff but SLP will follow up and attempt swallow eval.   Graylyn Bunney I. Hardin Negus, [place], [place] Office number [phone] Pager [place] 01/07/2019, 11:04 AM

## 2019-01-07 NOTE — Progress Notes (Signed)
Referring Physician(s): Code Stroke  Supervising Physician: Luanne Bras  Patient Status:  Stacy [name] - In-pt  Chief Complaint:  Stroke:  left MCA infarct due to left M1 occlusion status post failed mechanical thrombectomy and balloon angioplasty. Stent not deployed due to concurrent subdural hematoma and increased risk of hemorrhage  Subjective:  Stacy [name] is lying flat in bed. Head turned to the left. Husband at bedside to her left. She remains aphasic.  Allergies: Morphine and related; Amiodarone; Codeine; Lovaza [omega-3-acid ethyl esters]; Zaroxolyn [metolazone]; Ivp dye [iodinated diagnostic agents]; Penicillins; and Sulfa antibiotics  Medications: Prior to Admission medications   Medication Sig Start Date End Date Taking? Authorizing Provider  Ascorbic Acid (VITAMIN C) 1000 MG tablet Take 1,000 mg by mouth daily.   Yes [provider]  atorvastatin (LIPITOR) 20 MG tablet Take 20 mg by mouth daily.   Yes [provider]  calcium citrate-vitamin D (CITRACAL+D) 315-200 MG-UNIT per tablet Take 1 tablet by mouth 2 (two) times daily.   Yes [provider]  cholecalciferol (VITAMIN D) 1000 UNITS tablet Take 2,000 Units by mouth daily.   Yes [provider]  fluticasone (FLONASE) 50 MCG/ACT nasal spray Place 2 sprays into both nostrils daily.   Yes [provider]  furosemide (LASIX) 40 MG tablet Take 20 mg by mouth 3 (three) times a week.    Yes [provider]  gabapentin (NEURONTIN) 300 MG capsule Take 300 mg by mouth 3 (three) times daily.   Yes [provider]  glimepiride (AMARYL) 2 MG tablet Take 2 mg by mouth daily with breakfast.   Yes [provider]  insulin glargine (LANTUS) 100 UNIT/ML injection Inject 5 Units into the skin 2 (two) times daily.    Yes [provider]  levothyroxine (SYNTHROID, LEVOTHROID) 75 MCG tablet Take 75 mcg by mouth daily before breakfast.   Yes [provider]  loperamide (IMODIUM) 2 MG capsule Take 2 mg by mouth as needed for diarrhea or loose stools.   Yes [provider]  magnesium oxide (MAG-OX) 400 MG tablet Take 400 mg by mouth daily.   Yes [provider]  metoprolol succinate (TOPROL-XL) 50 MG 24 hr tablet Take 50 mg by mouth 2 (two) times daily.    Yes [provider]  Multiple Vitamins-Minerals (MULTIVITAMIN WITH MINERALS) tablet Take 1 tablet by mouth daily.   Yes [provider]  omega-3 acid ethyl esters (LOVAZA) 1 G capsule Take 1 g by mouth 2 (two) times daily.   Yes [provider]  omeprazole (PRILOSEC) 40 MG capsule Take 40 mg by mouth 2 (two) times daily.   Yes [provider]  polyethylene glycol (MIRALAX / GLYCOLAX) packet Take 17 g by mouth daily.   Yes [provider]  potassium chloride (KLOR-CON) 8 MEQ tablet Take 10 mEq by mouth daily.   Yes [provider]  sucralfate (CARAFATE) 1 g tablet Take 1 g by mouth 4 (four) times daily -  with meals and at bedtime.   Yes [provider]  vitamin E 400 UNIT capsule Take 400 Units by mouth daily.   Yes [provider]  warfarin (COUMADIN) 5 MG tablet Take 2.5 mg by mouth daily. 5MG -Wednesday AND Friday 2.5MG -ALL OTHER DAYS   Yes [provider]     Vital Signs: BP (!) 130/58   Pulse 70   Temp 99.5 F (37.5 C) (Axillary)   Resp (!) 24   Ht 5\' 5"  (1.651 m)  Wt 71.1 kg   SpO2 100%   BMI 26.08 kg/m   Physical Exam Awake, eyes open, left gaze Aphasic Does not follow any commands. Right arm flaccid Right groin stick ok, soft, no bleeding, no bruising, no hematoma, no pseudoaneurysm.  Imaging: Ct Angio Head W Or Wo Contrast  Result Date: 01/05/2019 CLINICAL DATA:  Focal neuro deficit with stroke suspected EXAM: CT ANGIOGRAPHY HEAD AND NECK TECHNIQUE: Multidetector CT imaging of the head and neck was performed using the standard protocol during bolus administration of  intravenous contrast. Multiplanar CT image reconstructions and MIPs were obtained to evaluate the vascular anatomy. Carotid stenosis measurements (when applicable) are obtained utilizing NASCET criteria, using the distal internal carotid diameter as the denominator. CONTRAST:  25mL ISOVUE-370 IOPAMIDOL (ISOVUE-370) INJECTION 76% COMPARISON:  Noncontrast head CT from earlier today FINDINGS: CTA NECK FINDINGS Aortic arch: Atherosclerosis.  Three vessel branching. Right carotid system: Mild atherosclerotic plaque. No stenosis or ulceration. Left carotid system: Mild atherosclerotic plaque. No stenosis or ulceration. Vertebral arteries: Proximal subclavian atherosclerosis without flow limiting stenosis. A tubal tortuosity. Both vertebral arteries are widely patent to the dura. Skeleton: Negative Other neck: No acute finding. Upper chest: Layering right pleural effusion. Review of the MIP images confirms the above findings CTA HEAD FINDINGS Anterior circulation: Left M1 to M2 occlusion with acute appearance causing a proximal meniscus sign. There is downstream under filled reconstitution vessels. Atherosclerotic calcification of the carotid siphons. Posterior circulation: Symmetric vertebral arteries. The vertebral and basilar arteries are smooth and widely patent. No branch occlusion or flow limiting stenosis. Venous sinuses: Patent as permitted by contrast timing Anatomic variants: None significant. Delayed phase: No abnormal intracranial enhancement. Known mixed density subdural hematoma along the left cerebral convexity. Review of the MIP images confirms the above findings Critical Value/emergent results were called by telephone at the time of interpretation on 01/05/2019 at 7:53 am to Dr. Delora Fuel , who verbally acknowledged these results. IMPRESSION: 1. 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 There is underfilling of the reconstituted downstream vessels. 2. Mixed density subdural hematoma along the  left cerebral convexity, see preceding noncontrast head CT. 3. Limited atherosclerosis for age. No flow limiting stenosis or ulceration. Electronically Signed   By: Monte Fantasia M.D.   On: 01/05/2019 07:59   Dg Chest 2 View  Result Date: 01/05/2019 CLINICAL DATA:  Initial evaluation for acute trauma, fall. EXAM: CHEST - 2 VIEW COMPARISON:  Prior radiograph from 09/02/2018 FINDINGS: There has been interval placement of a triple lead transvenous pacemaker/AICD. Cardiomegaly is relatively stable. Mediastinal silhouette within normal limits. Aortic atherosclerosis. Lungs hypoinflated. No focal infiltrates. No pulmonary edema. Small layering right pleural effusion seen posteriorly on lateral projection. No pneumothorax. No acute osseous finding. Multiple surgical clips overlie the left axilla. IMPRESSION: 1. Small layering right pleural effusion. 2. No other active cardiopulmonary disease. Electronically Signed   By: Jeannine Boga M.D.   On: 01/05/2019 06:15   Dg Clavicle Left  Result Date: 01/05/2019 CLINICAL DATA:  Initial evaluation for acute left clavicular pain status post fall. EXAM: LEFT CLAVICLE - 2+ VIEWS COMPARISON:  None. FINDINGS: No acute fracture or dislocation. Osteoarthritic changes present at the left [facility] joint. Pacemaker electrodes partially overlie the clavicle. Visualized left lung apex clear. No soft tissue abnormality. IMPRESSION: No acute osseous abnormality about the left clavicle. Electronically Signed   By: Jeannine Boga M.D.   On: 01/05/2019 06:17   Ct Head Wo Contrast  Addendum Date: 01/07/2019   ADDENDUM REPORT: 01/07/2019  04:46 ADDENDUM: Correction: FINDINGS: Similar to decreased 8 mm LEFT (not RIGHT) holo hemispheric mixed density subdural hematoma. Electronically Signed   By: Elon Alas M.D.   On: 01/07/2019 04:46   Result Date: 01/07/2019 CLINICAL DATA:  Follow up stroke. Status post endovascular revascularization of LEFT MCA occlusion. EXAM: CT HEAD  WITHOUT CONTRAST TECHNIQUE: Contiguous axial images were obtained from the base of the skull through the vertex without intravenous contrast. COMPARISON:  None. FINDINGS: BRAIN: No intraparenchymal hemorrhage, mass effect nor midline shift. The ventricles and sulci are normal for age. Patchy supratentorial white matter hypodensities within normal range for patient's age, though non-specific are most compatible with chronic small vessel ischemic disease. No acute large vascular territory infarcts. Similar to decreased 8 mm RIGHT holo hemispheric mixed density subdural hematoma. VASCULAR: Moderate calcific atherosclerosis of the carotid siphons. Asymmetrically dense LEFT MCA best appreciated on sagittal series. The SKULL: No skull fracture. No significant scalp soft tissue swelling. SINUSES/ORBITS: Paranasal sinuses are well aerated. Trace bilateral mastoid effusions. Included ocular globes and orbital contents are non-suspicious. Status post bilateral ocular lens implants. OTHER: None. IMPRESSION: 1. Dense LEFT MCA: Recurrent occlusion possible versus enhancing residual thrombus. 2. No acute infarct. 3. Similar to decreased mixed density LEFT holo hemispheric subdural hematoma. 4. These results will be called to the ordering clinician or representative by the professional radiologist assistant, and communication documented in [employer]board. Electronically Signed: By: Elon Alas M.D. On: 01/06/2019 05:14   Ct Head Wo Contrast  Result Date: 01/07/2019 CLINICAL DATA:  Follow up stroke. Status post endovascular revascularization of LEFT MCA occlusion history of breast cancer. EXAM: CT HEAD WITHOUT CONTRAST TECHNIQUE: Contiguous axial images were obtained from the base of the skull through the vertex without intravenous contrast. COMPARISON:  CT HEAD January 06, 2018. FINDINGS: BRAIN: No intraparenchymal hemorrhage, mass effect nor midline shift. The ventricles and sulci are normal for age. Patchy  supratentorial white matter hypodensities compatible with chronic small vessel ischemic changes. No acute large vascular territory infarcts. Stable 8 mm mixed density LEFT holo hemispheric subdural hematoma. Basal cisterns are patent. VASCULAR: Moderate calcific atherosclerosis of the carotid siphons. SKULL: No skull fracture. No significant scalp soft tissue swelling. SINUSES/ORBITS: Trace paranasal sinus mucosal thickening. Minimal mastoid effusions. Included ocular globes and orbital contents are non-suspicious. Status post bilateral ocular lens implants. OTHER: None. IMPRESSION: 1. No acute intracranial process. 2. Stable 8 mm mixed density LEFT holo hemispheric subdural hematoma. Electronically Signed   By: Elon Alas M.D.   On: 01/07/2019 04:45   Ct Head Wo Contrast  Result Date: 01/05/2019 CLINICAL DATA:  83 year old female fell off toilet tonight. Garbled speech and mild facial droop. Initial encounter. EXAM: CT HEAD WITHOUT CONTRAST CT CERVICAL SPINE WITHOUT CONTRAST TECHNIQUE: Multidetector CT imaging of the head and cervical spine was performed following the standard protocol without intravenous contrast. Multiplanar CT image reconstructions of the cervical spine were also generated. COMPARISON:  None. FINDINGS: CT HEAD FINDINGS Brain: Complex broad-base left convexity subdural hematoma. Largest component appears hypodense (possibly chronic) measuring 10.1 mm maximal thickness. More acute appearing component (which is slightly hyperdense) measures up to 4.3 mm. Mild local mass effect upon adjacent frontal-parietal gyri. Minimal bowing of the septum to the right. Thin left tentorial subdural hematoma. Tubular hyperdensity along the course of the left middle cerebral artery M1 segment may be related to thrombus or possibly tiny amount of blood. Chronic microvascular changes. Global atrophy. Vascular: As above. Skull: No skull fracture noted. Sinuses/Orbits: No acute orbital  abnormality.  Visualized paranasal sinuses are clear. Other: Mastoid air cells and middle ear cavities are clear. CT CERVICAL SPINE FINDINGS Alignment: Slight scoliosis convex left. Minimal anterior slip C4 secondary to right-sided facet degenerative changes. Skull base and vertebrae: No cervical spine fracture. Nonspecific 7 mm right C4 and C6 vertebral body lucency Soft tissues and spinal canal: No abnormal prevertebral soft tissue swelling. Disc levels: Scattered cervical spondylotic changes, most notable C6-7. No high-grade spinal stenosis. Upper chest: No worrisome abnormality Other: No worrisome abnormality IMPRESSION: CT HEAD: 1. Complex broad-base left convexity subdural hematoma. Largest component appears hypodense (possibly chronic) measuring 10.1 mm maximal thickness. More acute appearing component (which is slightly hyperdense) measures up to 4.3 mm. Mild local mass effect upon adjacent frontal-parietal gyri. Minimal bowing of the septum to the right. No skull fracture. 2. Thin left tentorial subdural hematoma. 3. Tubular hyperdensity along the course of the left middle cerebral artery M1 segment may be related to thrombus or possibly tiny amount of blood. 4. Chronic microvascular changes. 5. Global atrophy. CT CERVICAL SPINE: 1. Slight scoliosis convex left. Minimal anterior slip C4 secondary to right-sided facet degenerative changes. 2. No cervical spine fracture noted. No abnormal prevertebral soft tissue swelling. 3. Nonspecific 7 mm right C4 and C6 vertebral body lucency. These results were called by telephone at the time of interpretation on 01/05/2019 at 6:01 am to Dr. Delora Fuel , who verbally acknowledged these results. Electronically Signed   By: Genia Del M.D.   On: 01/05/2019 06:19   Ct Angio Neck W And/or Wo Contrast  Result Date: 01/05/2019 CLINICAL DATA:  Focal neuro deficit with stroke suspected EXAM: CT ANGIOGRAPHY HEAD AND NECK TECHNIQUE: Multidetector CT imaging of the head and neck was  performed using the standard protocol during bolus administration of intravenous contrast. Multiplanar CT image reconstructions and MIPs were obtained to evaluate the vascular anatomy. Carotid stenosis measurements (when applicable) are obtained utilizing NASCET criteria, using the distal internal carotid diameter as the denominator. CONTRAST:  24mL ISOVUE-370 IOPAMIDOL (ISOVUE-370) INJECTION 76% COMPARISON:  Noncontrast head CT from earlier today FINDINGS: CTA NECK FINDINGS Aortic arch: Atherosclerosis.  Three vessel branching. Right carotid system: Mild atherosclerotic plaque. No stenosis or ulceration. Left carotid system: Mild atherosclerotic plaque. No stenosis or ulceration. Vertebral arteries: Proximal subclavian atherosclerosis without flow limiting stenosis. A tubal tortuosity. Both vertebral arteries are widely patent to the dura. Skeleton: Negative Other neck: No acute finding. Upper chest: Layering right pleural effusion. Review of the MIP images confirms the above findings CTA HEAD FINDINGS Anterior circulation: Left M1 to M2 occlusion with acute appearance causing a proximal meniscus sign. There is downstream under filled reconstitution vessels. Atherosclerotic calcification of the carotid siphons. Posterior circulation: Symmetric vertebral arteries. The vertebral and basilar arteries are smooth and widely patent. No branch occlusion or flow limiting stenosis. Venous sinuses: Patent as permitted by contrast timing Anatomic variants: None significant. Delayed phase: No abnormal intracranial enhancement. Known mixed density subdural hematoma along the left cerebral convexity. Review of the MIP images confirms the above findings Critical Value/emergent results were called by telephone at the time of interpretation on 01/05/2019 at 7:53 am to Dr. Delora Fuel , who verbally acknowledged these results. IMPRESSION: 1. 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 There is underfilling of the  reconstituted downstream vessels. 2. Mixed density subdural hematoma along the left cerebral convexity, see preceding noncontrast head CT. 3. Limited atherosclerosis for age. No flow limiting stenosis or ulceration. Electronically Signed   By: Angelica Chessman  Watts M.D.   On: 01/05/2019 07:59   Ct Cervical Spine Wo Contrast  Result Date: 01/05/2019 CLINICAL DATA:  83 year old female fell off toilet tonight. Garbled speech and mild facial droop. Initial encounter. EXAM: CT HEAD WITHOUT CONTRAST CT CERVICAL SPINE WITHOUT CONTRAST TECHNIQUE: Multidetector CT imaging of the head and cervical spine was performed following the standard protocol without intravenous contrast. Multiplanar CT image reconstructions of the cervical spine were also generated. COMPARISON:  None. FINDINGS: CT HEAD FINDINGS Brain: Complex broad-base left convexity subdural hematoma. Largest component appears hypodense (possibly chronic) measuring 10.1 mm maximal thickness. More acute appearing component (which is slightly hyperdense) measures up to 4.3 mm. Mild local mass effect upon adjacent frontal-parietal gyri. Minimal bowing of the septum to the right. Thin left tentorial subdural hematoma. Tubular hyperdensity along the course of the left middle cerebral artery M1 segment may be related to thrombus or possibly tiny amount of blood. Chronic microvascular changes. Global atrophy. Vascular: As above. Skull: No skull fracture noted. Sinuses/Orbits: No acute orbital abnormality. Visualized paranasal sinuses are clear. Other: Mastoid air cells and middle ear cavities are clear. CT CERVICAL SPINE FINDINGS Alignment: Slight scoliosis convex left. Minimal anterior slip C4 secondary to right-sided facet degenerative changes. Skull base and vertebrae: No cervical spine fracture. Nonspecific 7 mm right C4 and C6 vertebral body lucency Soft tissues and spinal canal: No abnormal prevertebral soft tissue swelling. Disc levels: Scattered cervical spondylotic  changes, most notable C6-7. No high-grade spinal stenosis. Upper chest: No worrisome abnormality Other: No worrisome abnormality IMPRESSION: CT HEAD: 1. Complex broad-base left convexity subdural hematoma. Largest component appears hypodense (possibly chronic) measuring 10.1 mm maximal thickness. More acute appearing component (which is slightly hyperdense) measures up to 4.3 mm. Mild local mass effect upon adjacent frontal-parietal gyri. Minimal bowing of the septum to the right. No skull fracture. 2. Thin left tentorial subdural hematoma. 3. Tubular hyperdensity along the course of the left middle cerebral artery M1 segment may be related to thrombus or possibly tiny amount of blood. 4. Chronic microvascular changes. 5. Global atrophy. CT CERVICAL SPINE: 1. Slight scoliosis convex left. Minimal anterior slip C4 secondary to right-sided facet degenerative changes. 2. No cervical spine fracture noted. No abnormal prevertebral soft tissue swelling. 3. Nonspecific 7 mm right C4 and C6 vertebral body lucency. These results were called by telephone at the time of interpretation on 01/05/2019 at 6:01 am to Dr. Delora Fuel , who verbally acknowledged these results. Electronically Signed   By: Genia Del M.D.   On: 01/05/2019 06:19    Labs:  CBC: Recent Labs    09/02/18 0930 01/05/19 0559 01/06/19 0510 01/07/19 0454  WBC 7.3 8.6 13.3* 11.4*  HGB 13.1 10.1* 8.0* 8.3*  HCT 38.2 32.6* 25.7* 26.8*  PLT 129* 194 233 224    COAGS: Recent Labs    09/02/18 0930 10/31/18 0926 01/05/19 0559  INR 2.42 2.86 1.59  APTT  --   --  29    BMP: Recent Labs    09/02/18 0930 01/05/19 0559 01/06/19 0510 01/07/19 0454  NA 140 140 140 141  K 4.1 3.6 3.6 3.7  CL 106 104 111 112*  CO2 28 22 18* 16*  GLUCOSE 181* 112* 130* 177*  BUN 16 8 20 20   CALCIUM 9.0 8.9 8.4* 8.3*  CREATININE 0.88 1.01* 1.09* 1.01*  GFRNONAA >60 53* 48* 53*  GFRAA >60 >60 56* >60    LIVER FUNCTION TESTS: Recent Labs     01/05/19 0559  BILITOT 1.4*  AST 47*  ALT 175*  ALKPHOS 51  PROT 6.0*  ALBUMIN 3.2*    Assessment and Plan:  Stroke = Left MCA infarct due to left M1 occlusion status post failed mechanical thrombectomy and balloon angioplasty.   Stent not deployed due to concurrent subdural hematoma and increased risk of hemorrhage.  Remains aphasic with left gaze preference.  Care per neurology.  Can remove right groin dressing tomorrow.  Electronically Signed: Murrell Redden, PA-C 01/07/2019, 9:46 AM     I spent a total of 15 Minutes at the the patient's bedside AND on the patient's hospital floor or unit, greater than 50% of which was counseling/coordinating care for f/u after cerebral angiography.

## 2019-01-07 NOTE — Progress Notes (Addendum)
PROGRESS NOTE    Stacy [name]  WIO:[id number]  DOB: 08-25-1936  DOA: 01/05/2019 PCP: Rusty Aus, MD  Brief Narrative:   83 year old female with history of hypertension hyperlipidemia, Diabetes mellitus, hypothyroidism, breast cancer,A. fib on chronic anticoagulation with warfarin who had a fall 2 weeks back as well as on the day of admission presented to this [facility] on January 21 with right facial droop and aphasia.  Patient felt to have left MCA stroke due to left MI occlusion.  Seen by neurology.  She failed mechanical thrombectomy and balloon angioplasty attempt, stent not deployed due to concurrent subdural hematoma and increased risk of hemorrhage.  Neurology has been following this patient closely.  Anticoagulation has been on hold due to subdural hematoma.  Imaging studies of the head have not shown brain infarcts on CT scan x3.  Neurology entertaining the possibility of prolonged postictal state from subdural hematoma.  Subjective:  Patient awake, appears to have left gaze preference.  Nonverbal and not following commands.  Dense right hemiplegia.  Husband bedside and is hopeful of her recovery as he states he himself recovered from acute stroke after being referred to hospice.  He however understands poor prognosis at this point.  Patient has orders from neurology to transfer out of progressive care to [place] floor.  Objective: Vitals:   01/07/19 1000 01/07/19 1158 01/07/19 1233 01/07/19 1556  BP:   135/60 (!) 151/67  Pulse: 69  70 72  Resp: (!) 22  18 18   Temp:  99.4 F (37.4 C) 99 F (37.2 C) 98.7 F (37.1 C)  TempSrc:  Axillary Axillary Axillary  SpO2: 100%  97% 100%  Weight:      Height:        Intake/Output Summary (Last 24 hours) at 01/07/2019 1807 Last data filed at 01/07/2019 0400 Gross per 24 hour  Intake 640.81 ml  Output -  Net 640.81 ml   Filed Weights   01/06/19 1400  Weight: 71.1 kg    Physical Examination:  General exam: Awake but  nonverbal, no apparent distress.  Left gaze preference Respiratory system: Clear to auscultation. Respiratory effort normal. Cardiovascular system: S1 & S2 heard, irregular. No JVD, murmurs, rubs, gallops or clicks. No pedal edema. Gastrointestinal system: Abdomen is nondistended, soft and nontender. No organomegaly or masses felt. Normal bowel sounds heard. Central nervous system: Alert, disoriented, aphasic, not following commands.  Dense right hemiplegia. Skin: No rashes, lesions or ulcers Musculoskeletal: No joint abnormalities.  Hemiplegia as described above Psychiatry: Could not assess due to mental status    Data Reviewed: I have personally reviewed following labs and imaging studies  CBC: Recent Labs  Lab 01/05/19 0559 01/06/19 0510 01/07/19 0454  WBC 8.6 13.3* 11.4*  NEUTROABS 6.8 10.0*  --   HGB 10.1* 8.0* 8.3*  HCT 32.6* 25.7* 26.8*  MCV 97.3 98.8 100.0  PLT 194 233 384   Basic Metabolic Panel: Recent Labs  Lab 01/05/19 0559 01/06/19 0510 01/07/19 0454  NA 140 140 141  K 3.6 3.6 3.7  CL 104 111 112*  CO2 22 18* 16*  GLUCOSE 112* 130* 177*  BUN 8 20 20   CREATININE 1.01* 1.09* 1.01*  CALCIUM 8.9 8.4* 8.3*   GFR: Estimated Creatinine Clearance: 43.9 mL/min (A) (by C-G formula based on SCr of 1.01 mg/dL (H)). Liver Function Tests: Recent Labs  Lab 01/05/19 0559  AST 47*  ALT 175*  ALKPHOS 51  BILITOT 1.4*  PROT 6.0*  ALBUMIN 3.2*   No  results for input(s): LIPASE, AMYLASE in the last 168 hours. Recent Labs  Lab 01/07/19 0943  AMMONIA 14   Coagulation Profile: Recent Labs  Lab 01/05/19 0559  INR 1.59   Cardiac Enzymes: Recent Labs  Lab 01/05/19 0559  TROPONINI 0.03*   BNP (last 3 results) No results for input(s): PROBNP in the last 8760 hours. HbA1C: Recent Labs    01/06/19 0510  HGBA1C 6.9*   CBG: Recent Labs  Lab 01/06/19 2318 01/07/19 0334 01/07/19 0747 01/07/19 1157 01/07/19 1631  GLUCAP 141* 161* 155* 152* 142*    Lipid Profile: Recent Labs    01/06/19 0510  CHOL 87  HDL 31*  LDLCALC 35  TRIG 103  CHOLHDL 2.8   Thyroid Function Tests: No results for input(s): TSH, T4TOTAL, FREET4, T3FREE, THYROIDAB in the last 72 hours. Anemia Panel: No results for input(s): VITAMINB12, FOLATE, FERRITIN, TIBC, IRON, RETICCTPCT in the last 72 hours. Sepsis Labs: No results for input(s): PROCALCITON, LATICACIDVEN in the last 168 hours.  Recent Results (from the past 240 hour(s))  MRSA PCR Screening     Status: None   Collection Time: 01/05/19  1:03 PM  Result Value Ref Range Status   MRSA by PCR NEGATIVE NEGATIVE Final    Comment:        The GeneXpert MRSA Assay (FDA approved for NASAL specimens only), is one component of a comprehensive MRSA colonization surveillance program. It is not intended to diagnose MRSA infection nor to guide or monitor treatment for MRSA infections. Performed at [facility], [place] [address], [place], [place] [address]       Radiology Studies: Ct Head Wo Contrast  Addendum Date: 01/07/2019   ADDENDUM REPORT: 01/07/2019 04:46 ADDENDUM: Correction: FINDINGS: Similar to decreased 8 mm LEFT (not RIGHT) holo hemispheric mixed density subdural hematoma. Electronically Signed   By: Elon Alas M.D.   On: 01/07/2019 04:46   Result Date: 01/07/2019 CLINICAL DATA:  Follow up stroke. Status post endovascular revascularization of LEFT MCA occlusion. EXAM: CT HEAD WITHOUT CONTRAST TECHNIQUE: Contiguous axial images were obtained from the base of the skull through the vertex without intravenous contrast. COMPARISON:  None. FINDINGS: BRAIN: No intraparenchymal hemorrhage, mass effect nor midline shift. The ventricles and sulci are normal for age. Patchy supratentorial white matter hypodensities within normal range for patient's age, though non-specific are most compatible with chronic small vessel ischemic disease. No acute large vascular territory infarcts. Similar to  decreased 8 mm RIGHT holo hemispheric mixed density subdural hematoma. VASCULAR: Moderate calcific atherosclerosis of the carotid siphons. Asymmetrically dense LEFT MCA best appreciated on sagittal series. The SKULL: No skull fracture. No significant scalp soft tissue swelling. SINUSES/ORBITS: Paranasal sinuses are well aerated. Trace bilateral mastoid effusions. Included ocular globes and orbital contents are non-suspicious. Status post bilateral ocular lens implants. OTHER: None. IMPRESSION: 1. Dense LEFT MCA: Recurrent occlusion possible versus enhancing residual thrombus. 2. No acute infarct. 3. Similar to decreased mixed density LEFT holo hemispheric subdural hematoma. 4. These results will be called to the ordering clinician or representative by the professional radiologist assistant, and communication documented in [facility] Dashboard. Electronically Signed: By: Elon Alas M.D. On: 01/06/2019 05:14   Ct Head Wo Contrast  Result Date: 01/07/2019 CLINICAL DATA:  Follow up stroke. Status post endovascular revascularization of LEFT MCA occlusion history of breast cancer. EXAM: CT HEAD WITHOUT CONTRAST TECHNIQUE: Contiguous axial images were obtained from the base of the skull through the vertex without intravenous contrast. COMPARISON:  CT HEAD January 06, 2018. FINDINGS: BRAIN: No intraparenchymal hemorrhage, mass effect nor midline shift. The ventricles and sulci are normal for age. Patchy supratentorial white matter hypodensities compatible with chronic small vessel ischemic changes. No acute large vascular territory infarcts. Stable 8 mm mixed density LEFT holo hemispheric subdural hematoma. Basal cisterns are patent. VASCULAR: Moderate calcific atherosclerosis of the carotid siphons. SKULL: No skull fracture. No significant scalp soft tissue swelling. SINUSES/ORBITS: Trace paranasal sinus mucosal thickening. Minimal mastoid effusions. Included ocular globes and orbital contents are non-suspicious.  Status post bilateral ocular lens implants. OTHER: None. IMPRESSION: 1. No acute intracranial process. 2. Stable 8 mm mixed density LEFT holo hemispheric subdural hematoma. Electronically Signed   By: Elon Alas M.D.   On: 01/07/2019 04:45        Scheduled Meds: . atorvastatin  20 mg Oral q1800  . fluticasone  2 spray Each Nare Daily  . furosemide  20 mg Intravenous Once per day on Mon Wed Fri  . gabapentin  300 mg Oral TID  . insulin aspart  0-15 Units Subcutaneous Q4H  . iopamidol  100 mL Intravenous Once  . levothyroxine  37.5 mcg Intravenous Daily  . magnesium oxide  400 mg Oral Daily  . mouth rinse  15 mL Mouth Rinse BID  . pantoprazole (PROTONIX) IV  40 mg Intravenous Q24H  . potassium chloride  10 mEq Oral Daily  . sucralfate  1 g Oral TID WC & HS   Continuous Infusions: . sodium chloride 75 mL/hr at 01/07/19 1324  . clevidipine Stopped (01/06/19 1005)    Assessment & Plan:   1.  Aphasia, right hemiplegia: Likely secondary to left MCA infarct/embolic stroke, however not confirmed on imaging. Angioplasty x2 with continued TICI2b revascularization with reocclusion.  Stent considered but not placed due to subdural hemorrhage and need for dual antiplatelets for stent protection and patient already on warfarin. 2D Echo  EF 60-65%. No source of embolus.  Neurology suspecting prolonged seizure activity/postictal state.EEG 01/05/19 nonspecific focal left hemisphericdisturbancein the setting of a generalized nonspecific cerebral dysfunction (encephalopathy).   Plan to repeat EEG in a.m.  Remains off anticoagulation/antiplatelet agents in concern for problem #2. Remains on low-dose statin  2.  Left subdural hematoma: In the setting of falls/chronic anticoagulation.  Maximum thickness at 10 mm with mild local mass-effect.  3.  Chronic atrial fibrillation: Rate controlled on beta-blockers.  Anticoagulation on hold  4.  Diabetes mellitus type 2: Hemoglobin A1c at 6.9.  Add sliding  scale insulin.  N.p.o. and on IV fluids.  Husband states patient would not want PEG tube.  He however would consider NG tube for short-term feeding if recovery expected.  Insulin to be adjusted if started on tube feeds  5.  History of left breast cancer: ?reactive pleural effusion.   6. Elevated LFTs: Check hepatitis panel. Watch on statins and antiepileptics.  DVT prophylaxis: SCD Code Status: DNR Family / Patient Communication: Discussed with husband bedside Disposition Plan: To be decided     LOS: 2 days    Time spent:     Guilford Shi, MD [facility] Pager [phone]  If 7PM-7AM, please contact night-coverage [email or web address] Password [facility] 01/07/2019, 6:07 PM

## 2019-01-07 NOTE — Procedures (Signed)
History: 83 year old female being evaluated for aphasia and weakness  Sedation: None  Technique: This is a 21 channel routine scalp EEG performed at the bedside with bipolar and monopolar montages arranged in accordance to the international 10/20 system of electrode placement. One channel was dedicated to EKG recording.    Background: There continues to be generalized irregular delta and theta activity throughout the recording.  A posterior dominant rhythm of 7 -8 Hz is seen bilaterally.  In addition to the generalized irregular slow activity, there is also a focal prominence in the left hemisphere, maximal in the left temporal region.  There are occasional frontally predominant discharges with a shifting bifrontal predominance and triphasic morphology.  Photic stimulation: Physiologic driving is not performed  EEG Abnormalities: 1) triphasic waves 2) focal left hemispheric irregular slow activity 3) generalized irregular slow activity  Clinical Interpretation: This EEG is consistent with a nonspecific left focal hemispheric disturbance in the setting of a generalized nonspecific cerebral dysfunction (encephalopathy).  Compared to yesterday's recording, there is little difference.   There was no seizure or seizure predisposition recorded on this study. Please note that lack of epileptiform activity on EEG does not preclude the possibility of epilepsy.   Roland Rack, MD [facility] [phone]  If 7pm- 7am, please page neurology on call as listed in [place].

## 2019-01-07 NOTE — Progress Notes (Signed)
  Speech Language Pathology Treatment: Cognitive-Linquistic  Patient Details Name: Stacy [name] MRN: [id number] DOB: 11-20-1936 Today's Date: 01/07/2019 Time: 4098-1191 SLP Time Calculation (min) (ACUTE ONLY): 10 min  Assessment / Plan / Recommendation Clinical Impression  Pt was seen for treatment and her husband was at bedside for the session. Pt completed lingual resisitance exercises with maximum cues but did not verbalize during the session despite max cues. Models and phonemic cues were utilized to facilitate production of automatic sequences but no verbal output was demonstrated. No response was noted when singing was attempted. She demonstrated 0% accuracy with simple yes/no questions and 25% accuracy with simple 1-step commands. SLP will continue to follow pt for aphasia intervention.    HPI HPI: Pt is an 83 y.o. female with DM, HLD, hypothyroidism, breast cancer, paroxysmal atrial fibrillation anticoagulated on warfarin, who was admitted following a fall at home. She states that she woke up and walked normally to the bathroom, then fell and "everything went haywire". EMS reported an initial right facial droop and speech difficulty. They noted that she was not completing sentence and was having difficulty finding words. Since arrival to the ED, her speech deficit has waxed and waned. The CT of the head of 01/06/19 was negative for acute infarct but revealed dense left MCA: recurrent occlusion possible versus enhancingresidual thrombus. Similar to decreased mixed density LEFT holo hemispheric subdural hematoma. She is status post failed mechanical thrombectomy and balloon angioplasty. Stent not deployed due to concurrent subdural hematoma and increased risk of hemorrhage.      SLP Plan  Continue with current plan of care       Recommendations  Diet recommendations: NPO Medication Administration: Via alternative means                Oral Care Recommendations: Oral care QID SLP  Visit Diagnosis: Dysphagia, oropharyngeal phase (R13.12) Plan: Continue with current plan of care       Stacy [name], [place], [place] Office number [phone] Pager [place] 01/07/2019, 2:02 PM

## 2019-01-07 NOTE — Progress Notes (Signed)
EEG Completed; Results Pending  

## 2019-01-07 NOTE — Evaluation (Addendum)
Clinical/Bedside Swallow Evaluation Patient Details  Name: Stacy [name] MRN: [id number] Date of Birth: 22-Dec-1935  Today's Date: 01/07/2019 Time: SLP Start Time (ACUTE ONLY): 1315 SLP Stop Time (ACUTE ONLY): 1325 SLP Time Calculation (min) (ACUTE ONLY): 10 min  Past Medical History:  Past Medical History:  Diagnosis Date  . Arthritis   . Atherosclerosis of aorta ([place])   . Breast cancer ([place]) 2008   mastectomy left  . Cancer ([place])    breast  . Diabetes mellitus without complication ([place])   . Dysrhythmia    atrial tachycardia  . Dysrhythmia    PSVT  . Hyperlipidemia   . Hypothyroidism   . Sensorimotor neuropathy    Past Surgical History:  Past Surgical History:  Procedure Laterality Date  . ABLATION     cardiac  . BREAST BIOPSY Right 04/30/2016   path pending  . BREAST SURGERY    . ELECTROPHYSIOLOGIC STUDY N/A 06/22/2015   Procedure: Cardioversion;  Surgeon: Yolonda Kida, MD;  Location: [facility] ORS;  Service: Cardiovascular;  Laterality: N/A;  . ELECTROPHYSIOLOGIC STUDY N/A 04/09/2016   Procedure: Cardioversion;  Surgeon: Yolonda Kida, MD;  Location: [facility] ORS;  Service: Cardiovascular;  Laterality: N/A;  . ESOPHAGOGASTRODUODENOSCOPY (EGD) WITH PROPOFOL N/A 12/11/2016   Procedure: ESOPHAGOGASTRODUODENOSCOPY (EGD) WITH PROPOFOL;  Surgeon: Manya Silvas, MD;  Location: [facility] ENDOSCOPY;  Service: Endoscopy;  Laterality: N/A;  . MASTECTOMY Left 2008  . RADIOLOGY WITH ANESTHESIA N/A 01/05/2019   Procedure: IR WITH ANESTHESIA;  Surgeon: Radiologist, Medication, MD;  Location: [place];  Service: Radiology;  Laterality: N/A;   HPI:  Pt is an 83 y.o. female with DM, HLD, hypothyroidism, breast cancer, paroxysmal atrial fibrillation anticoagulated on warfarin, who was admitted following a fall at home. She states that she woke up and walked normally to the bathroom, then fell and "everything went haywire". EMS reported an initial right facial droop and speech difficulty. They  noted that she was not completing sentence and was having difficulty finding words. Since arrival to the ED, her speech deficit has waxed and waned. The CT of the head of 01/06/19 was negative for acute infarct but revealed dense left MCA: recurrent occlusion possible versus enhancingresidual thrombus. Similar to decreased mixed density LEFT holo hemispheric subdural hematoma. She is status post failed mechanical thrombectomy and balloon angioplasty. Stent not deployed due to concurrent subdural hematoma and increased risk of hemorrhage.   Assessment / Plan / Recommendation Clinical Impression  Pt was seen for bedside swallow evaluation with her husband present. Left gaze preference was still noted but she also attended to the right when the SLP was positioned on that side and spoke. No volitional swallow was demonstrated and she was unable to follow all the necessary commands for a complete oral mechanism exam.Trials were limited to puree and a single ice chip due to her performance. Pt demonstrated reduced awareness of the bolus and did not manipulate the bolus or close her mouth despite verbal and tactile cues. Boluses were ultimately removed from the pt's buccal cavity via oral swab and suctioning.She therefore presents as a high aspiration risk at this time and it is recommended that her NPO status be maintained. SLP will follow to assess improvement in swallow function and the need for instrumental assessment.  SLP Visit Diagnosis: Dysphagia, oropharyngeal phase (R13.12)    Aspiration Risk  Severe aspiration risk    Diet Recommendation NPO;Alternative means - temporary   Medication Administration: Via alternative means    Other  Recommendations Oral  Care Recommendations: Oral care QID Other Recommendations: Have oral suction available   Follow up Recommendations        Frequency and Duration min 2x/week  2 weeks       Prognosis Prognosis for Safe Diet Advancement: Fair Barriers to  Reach Goals: Severity of deficits Barriers/Prognosis Comment: Language impairments      Swallow Study   General Date of Onset: 01/05/19 HPI: Pt is an 83 y.o. female with DM, HLD, hypothyroidism, breast cancer, paroxysmal atrial fibrillation anticoagulated on warfarin, who was admitted following a fall at home. She states that she woke up and walked normally to the bathroom, then fell and "everything went haywire". EMS reported an initial right facial droop and speech difficulty. They noted that she was not completing sentence and was having difficulty finding words. Since arrival to the ED, her speech deficit has waxed and waned. The CT of the head of 01/06/19 was negative for acute infarct but revealed dense left MCA: recurrent occlusion possible versus enhancingresidual thrombus. Similar to decreased mixed density LEFT holo hemispheric subdural hematoma. She is status post failed mechanical thrombectomy and balloon angioplasty. Stent not deployed due to concurrent subdural hematoma and increased risk of hemorrhage. Type of Study: Bedside Swallow Evaluation Previous Swallow Assessment: None Diet Prior to this Study: NPO Temperature Spikes Noted: No Respiratory Status: Room air History of Recent Intubation: No Behavior/Cognition: Alert;Cooperative;Doesn't follow directions Oral Cavity Assessment: Within Functional Limits Oral Care Completed by SLP: Recent completion by staff Oral Cavity - Dentition: Adequate natural dentition Patient Positioning: Upright in bed;Postural control adequate for testing Baseline Vocal Quality: Not observed(due to aphasia) Volitional Cough: Cognitively unable to elicit Volitional Swallow: Unable to elicit    Oral/Motor/Sensory Function Overall Oral Motor/Sensory Function: Moderate impairment Facial ROM: Reduced right;Suspected CN VII (facial) dysfunction Facial Symmetry: Abnormal symmetry right;Suspected CN VII (facial) dysfunction Facial Strength: Reduced  right;Suspected CN VII (facial) dysfunction Facial Sensation: Other (Comment)(Unable to assess due to aphasia) Lingual ROM: (Unable to assess due to aphasia) Lingual Symmetry: (Unable to assess due to aphasia) Lingual Strength: (Unable to assess due to aphasia) Lingual Sensation: (Unable to assess due to aphasia)   Ice Chips Ice chips: Impaired Presentation: Spoon Oral Phase Impairments: Poor awareness of bolus   Thin Liquid Thin Liquid: Not tested    Nectar Thick Nectar Thick Liquid: Not tested   Honey Thick Honey Thick Liquid: Not tested   Puree Puree: Impaired Presentation: Spoon Oral Phase Impairments: Poor awareness of bolus   Solid   Rihana Kiddy I. Hardin Negus, [place], [place] Office number [phone] Pager [phone]  Solid: Not tested      [place] Marshall 01/07/2019,1:53 PM

## 2019-01-07 NOTE — Progress Notes (Signed)
Pt admitted to the unit as a transfer from [facility]N. Pt alert but non-verbal; telemetry applied and verified with CCMD: NT called to second verify; skin clean dry and intact with no pressure ulcer or opened wounds noted; IV intact and transfusing; SCD's on; pt and spouse at bedside oriented to the unit and room; fall/safety precaution and prevention education completed. Call light within reach and bed alarm on. VSS: will continue to closely monitor pt. Delia Heady RN   01/07/19 1233  Vitals  Temp 99 F (37.2 C)  Temp Source Axillary  BP 135/60  BP Location Left Arm  BP Method Automatic  Patient Position (if appropriate) Lying  Pulse Rate 70  Pulse Rate Source Dinamap  Resp 18  Oxygen Therapy  SpO2 97 %  O2 Device Room Air  MEWS Score  MEWS RR 0  MEWS Pulse 0  MEWS Systolic 0  MEWS LOC 1  MEWS Temp 0  MEWS Score 1  MEWS Score Color Green

## 2019-01-07 NOTE — Progress Notes (Signed)
CT called RN to inform her pt is allergic to ordered perfusion contrast and needs order for 13hrs prep. NP on-call and neurologist oncall paged. Dr. Lorraine Lax informed and he ordered to cancel for now stating is not emergent. CT staff [place] notified and she requested to cancel the order. Order discontinued. Delia Heady RN

## 2019-01-08 ENCOUNTER — Encounter (HOSPITAL_COMMUNITY): Payer: Self-pay | Admitting: Interventional Radiology

## 2019-01-08 LAB — GLUCOSE, CAPILLARY
GLUCOSE-CAPILLARY: 119 mg/dL — AB (ref 70–99)
Glucose-Capillary: 128 mg/dL — ABNORMAL HIGH (ref 70–99)
Glucose-Capillary: 138 mg/dL — ABNORMAL HIGH (ref 70–99)
Glucose-Capillary: 143 mg/dL — ABNORMAL HIGH (ref 70–99)
Glucose-Capillary: 190 mg/dL — ABNORMAL HIGH (ref 70–99)
Glucose-Capillary: 221 mg/dL — ABNORMAL HIGH (ref 70–99)
Glucose-Capillary: 224 mg/dL — ABNORMAL HIGH (ref 70–99)

## 2019-01-08 LAB — BASIC METABOLIC PANEL
Anion gap: 11 (ref 5–15)
BUN: 16 mg/dL (ref 8–23)
CO2: 18 mmol/L — ABNORMAL LOW (ref 22–32)
CREATININE: 0.96 mg/dL (ref 0.44–1.00)
Calcium: 8.4 mg/dL — ABNORMAL LOW (ref 8.9–10.3)
Chloride: 114 mmol/L — ABNORMAL HIGH (ref 98–111)
GFR calc Af Amer: >60 mL/min (ref >60)
GFR calc non Af Amer: 56 mL/min — ABNORMAL LOW (ref >60)
Glucose, Bld: 155 mg/dL — ABNORMAL HIGH (ref 70–99)
Potassium: 2.9 mmol/L — ABNORMAL LOW (ref 3.5–5.1)
Sodium: 143 mmol/L (ref 135–145)

## 2019-01-08 LAB — CBC
HCT: 25.6 % — ABNORMAL LOW (ref 36.0–46.0)
Hemoglobin: 8 g/dL — ABNORMAL LOW (ref 12.0–15.0)
MCH: 30.7 pg (ref 26.0–34.0)
MCHC: 31.3 g/dL (ref 30.0–36.0)
MCV: 98.1 fL (ref 80.0–100.0)
Platelets: 228 10*3/uL (ref 150–400)
RBC: 2.61 MIL/uL — ABNORMAL LOW (ref 3.87–5.11)
RDW: 15.1 % (ref 11.5–15.5)
WBC: 8.7 10*3/uL (ref 4.0–10.5)
nRBC: 0 % (ref 0.0–0.2)

## 2019-01-08 MED ORDER — ASPIRIN 81 MG PO CHEW
81.0000 mg | CHEWABLE_TABLET | Freq: Every day | ORAL | Status: DC
  Administered 2019-01-09 – 2019-01-14 (×6): 81 mg
  Filled 2019-01-08 (×6): qty 1

## 2019-01-08 MED ORDER — METOPROLOL TARTRATE 25 MG/10 ML ORAL SUSPENSION
25.0000 mg | Freq: Two times a day (BID) | ORAL | Status: DC
  Administered 2019-01-08 – 2019-01-14 (×13): 25 mg
  Filled 2019-01-08 (×13): qty 10

## 2019-01-08 MED ORDER — POTASSIUM CHLORIDE 20 MEQ/15ML (10%) PO SOLN
40.0000 meq | Freq: Once | ORAL | Status: AC
  Administered 2019-01-08: 40 meq
  Filled 2019-01-08: qty 30

## 2019-01-08 MED ORDER — PANTOPRAZOLE SODIUM 40 MG PO PACK
40.0000 mg | PACK | Freq: Every day | ORAL | Status: DC
  Administered 2019-01-08 – 2019-01-14 (×7): 40 mg
  Filled 2019-01-08 (×7): qty 20

## 2019-01-08 MED ORDER — ATORVASTATIN CALCIUM 10 MG PO TABS
20.0000 mg | ORAL_TABLET | Freq: Every day | ORAL | Status: DC
  Administered 2019-01-08: 20 mg
  Filled 2019-01-08: qty 2

## 2019-01-08 MED ORDER — CHLORHEXIDINE GLUCONATE 0.12 % MT SOLN
15.0000 mL | Freq: Two times a day (BID) | OROMUCOSAL | Status: DC
  Administered 2019-01-08 – 2019-01-15 (×14): 15 mL via OROMUCOSAL
  Filled 2019-01-08 (×14): qty 15

## 2019-01-08 MED ORDER — SENNOSIDES-DOCUSATE SODIUM 8.6-50 MG PO TABS: 1.0000 | ORAL_TABLET | Freq: Every evening | ORAL | Status: DC | PRN

## 2019-01-08 MED ORDER — MAGNESIUM OXIDE 400 (241.3 MG) MG PO TABS
400.0000 mg | ORAL_TABLET | Freq: Every day | ORAL | Status: DC
  Administered 2019-01-08 – 2019-01-14 (×7): 400 mg
  Filled 2019-01-08 (×7): qty 1

## 2019-01-08 MED ORDER — JEVITY 1.2 CAL PO LIQD
1000.0000 mL | ORAL | Status: DC
  Administered 2019-01-08: 60 mL/h
  Administered 2019-01-09 – 2019-01-13 (×4): 1000 mL
  Administered 2019-01-14: 07:00:00
  Filled 2019-01-08 (×9): qty 1000

## 2019-01-08 MED ORDER — LEVOTHYROXINE SODIUM 75 MCG PO TABS
75.0000 ug | ORAL_TABLET | Freq: Every day | ORAL | Status: DC
  Administered 2019-01-09 – 2019-01-14 (×6): 75 ug
  Filled 2019-01-08 (×6): qty 1

## 2019-01-08 MED ORDER — POTASSIUM CHLORIDE 10 MEQ/100ML IV SOLN
10.0000 meq | INTRAVENOUS | Status: AC
  Administered 2019-01-08 (×4): 10 meq via INTRAVENOUS
  Filled 2019-01-08 (×4): qty 100

## 2019-01-08 MED ORDER — GABAPENTIN 250 MG/5ML PO SOLN
100.0000 mg | Freq: Two times a day (BID) | ORAL | Status: DC
  Administered 2019-01-08 – 2019-01-14 (×10): 100 mg
  Filled 2019-01-08 (×15): qty 2

## 2019-01-08 NOTE — Progress Notes (Signed)
  Speech Language Pathology Treatment: Dysphagia  Patient Details Name: Stacy [name] MRN: [id number] DOB: 1936-02-02 Today's Date: 01/08/2019 Time: 0935-1000 SLP Time Calculation (min) (ACUTE ONLY): 25 min  Assessment / Plan / Recommendation Clinical Impression  Pt was seen for treatment to assess improvement in swallow function. She was alert and vocalized once but no verbalization was noted. Oral care was provided and pt required min cues. Trials were limited to puree solids, a single tsp of thin liquids, and one ice chip due to her performance. Pt continues to demonstrate no awareness of boluses and did not manipulate the boluses or close her mouth despite verbal, visual, and tactile cues. Labial stripping was therefore absent and no volitional swallow was demonstrated despite max cues. Boluses were again removed from the pt's buccal cavity via oral swab and suctioning. She remains a high aspiration risk at this time and it is recommended that her NPO status be maintained. Non-oral alimentation (e.g., Cortrak) is also recommended at this time and SLP will re-assess next week to determine improvement in swallow function and the need for instrumental assessment.    HPI HPI: Pt is an 83 y.o. female with DM, HLD, hypothyroidism, breast cancer, paroxysmal atrial fibrillation anticoagulated on warfarin, who was admitted following a fall at home. She states that she woke up and walked normally to the bathroom, then fell and "everything went haywire". EMS reported an initial right facial droop and speech difficulty. They noted that she was not completing sentence and was having difficulty finding words. Since arrival to the ED, her speech deficit has waxed and waned. The CT of the head of 01/06/19 was negative for acute infarct but revealed dense left MCA: recurrent occlusion possible versus enhancingresidual thrombus. Similar to decreased mixed density LEFT holo hemispheric subdural hematoma. She is status  post failed mechanical thrombectomy and balloon angioplasty. Stent not deployed due to concurrent subdural hematoma and increased risk of hemorrhage.      SLP Plan  Continue with current plan of care       Recommendations  Diet recommendations: NPO Medication Administration: Via alternative means                Oral Care Recommendations: Oral care QID SLP Visit Diagnosis: Dysphagia, oropharyngeal phase (R13.12) Plan: Continue with current plan of care       Maecie Sevcik I. Hardin Negus, [place], [place] Office number [phone] Pager [place] 01/08/2019, 10:15 AM

## 2019-01-08 NOTE — Progress Notes (Addendum)
PROGRESS NOTE    Stacy [name]  WPY:[id number]  DOB: 1936/07/15  DOA: 01/05/2019 PCP: Rusty Aus, MD  Brief Narrative:   83 year old female with history of hypertension hyperlipidemia, Diabetes mellitus, hypothyroidism, breast cancer,A. fib on chronic anticoagulation with warfarin who had a fall 2 weeks back as well as on the day of admission presented to this [facility] on January 21 with right facial droop and aphasia.  Patient felt to have left MCA stroke due to left MI occlusion.  Seen by neurology.  She failed mechanical thrombectomy and balloon angioplasty attempt, stent not deployed due to concurrent subdural hematoma and increased risk of hemorrhage.  Neurology has been following this patient closely.  Anticoagulation has been on hold due to subdural hematoma.  Imaging studies of the head have not shown brain infarcts on CT scan x3.  Repeat EEG, negative for epileptiform activities  Subjective: -No changes, remains globally aphasic with left gaze preference, dense right hemiplegia -Failed swallow evaluation again -Discussed prognosis with husband, he is hopeful for improvement considering his own personal experience in a similar situation   Assessment & Plan:   1.  Acute left MCA stroke, due to M1 occlusion -Status post failed mechanical thrombectomy x2 and balloon angioplasty -CTA head & neck ELVO L M1 thrombus.  Mixed density SDH left cerebral convexity. Cerebral angiogram endovascular TICI2b revascularization L M1 occlusion with 2 passes embotrap, 1 pass Solitaire.  Angioplasty x2 with continued TICI2b revascularization with reocclusion.  -Repeat CT head with stable subdural hematoma -Stent could not be deployed due to concurrent subdural hematoma of moderate size and increased risk of hemorrhage if antiplatelets were needed -Unable to have an MRI due to pacemaker, stroke not noted on repeat CTs -EEG negative for epileptiform activity -Other possibility is SDH  causing mass-effect -With residual dense right hemiplegia, global aphasia and severe dysphagia -2D echocardiogram with preserved EF, no cardiac source of embolus -LDL was 35, hemoglobin A1c 6.9 -Severe dysphagia and global aphasia significantly debilitating at this time, discussed overall prognosis with husband and granddaughter at bedside, they agree that PEG tube would not be what she would want in such a situation, plan for core track and temporary tube feeds, reassess in few days, may need palliative evaluation if she does not improve with optimizing nutrition and medications -remains off anticoagulation/antiplatelet agents due to subdural hematoma, continue statin -PT/OT eval pending  2.  Left subdural hematoma: In the setting of falls/chronic anticoagulation.  Maximum thickness at 10 mm with mild local mass-effect. -Coumadin discontinued  3.  Chronic atrial fibrillation:  -restart Toprol at a lower dose, anticoagulation on hold as above  4.  Diabetes mellitus type 2:  -Hemoglobin A1c at 6.9.   -Continue sliding scale insulin, may need long-acting insulin after tube feeds started  5.  History of left breast cancer:  6. Elevated LFTs: Check hepatitis panel. Watch on statins   DVT prophylaxis: SCD Code Status: DNR Family / Patient Communication: Discussed with husband bedside Disposition Plan: To be determined  Objective: Vitals:   01/07/19 1934 01/07/19 2346 01/08/19 0430 01/08/19 0808  BP: (!) 149/67 135/71 139/71 (!) 147/66  Pulse: 72 70 69 69  Resp: 20 18 18 15   Temp: 98.4 F (36.9 C) 98.6 F (37 C) 98.9 F (37.2 C) 97.7 F (36.5 C)  TempSrc: Oral Oral Oral Oral  SpO2: 100% 100% 100% 100%  Weight:      Height:        Intake/Output Summary (Last 24 hours) at  01/08/2019 1051 Last data filed at 01/08/2019 0000 Gross per 24 hour  Intake 497.73 ml  Output 300 ml  Net 197.73 ml   Filed Weights   01/06/19 1400  Weight: 71.1 kg    Physical Examination:  Gen:  Awake, Alert, chronically ill-appearing, aphasic, left gaze preference HEENT: PERRLA, Neck supple, no JVD Lungs: Decreased breath sounds at both bases CVS: RRR,No Gallops,Rubs or new Murmurs Abd: soft, Non tender, non distended, BS present Extremities: Dense right hemiplegia Skin: no new rashes Psychiatry: Could not assess due to mental status    Data Reviewed: I have personally reviewed following labs and imaging studies  CBC: Recent Labs  Lab 01/05/19 0559 01/06/19 0510 01/07/19 0454 01/08/19 0639  WBC 8.6 13.3* 11.4* 8.7  NEUTROABS 6.8 10.0*  --   --   HGB 10.1* 8.0* 8.3* 8.0*  HCT 32.6* 25.7* 26.8* 25.6*  MCV 97.3 98.8 100.0 98.1  PLT 194 233 224 003   Basic Metabolic Panel: Recent Labs  Lab 01/05/19 0559 01/06/19 0510 01/07/19 0454 01/08/19 0639  NA 140 140 141 143  K 3.6 3.6 3.7 2.9*  CL 104 111 112* 114*  CO2 22 18* 16* 18*  GLUCOSE 112* 130* 177* 155*  BUN 8 20 20 16   CREATININE 1.01* 1.09* 1.01* 0.96  CALCIUM 8.9 8.4* 8.3* 8.4*   GFR: Estimated Creatinine Clearance: 46.2 mL/min (by C-G formula based on SCr of 0.96 mg/dL). Liver Function Tests: Recent Labs  Lab 01/05/19 0559  AST 47*  ALT 175*  ALKPHOS 51  BILITOT 1.4*  PROT 6.0*  ALBUMIN 3.2*   No results for input(s): LIPASE, AMYLASE in the last 168 hours. Recent Labs  Lab 01/07/19 0943  AMMONIA 14   Coagulation Profile: Recent Labs  Lab 01/05/19 0559  INR 1.59   Cardiac Enzymes: Recent Labs  Lab 01/05/19 0559  TROPONINI 0.03*   BNP (last 3 results) No results for input(s): PROBNP in the last 8760 hours. HbA1C: Recent Labs    01/06/19 0510  HGBA1C 6.9*   CBG: Recent Labs  Lab 01/07/19 1631 01/07/19 1946 01/08/19 0012 01/08/19 0432 01/08/19 0820  GLUCAP 142* 139* 119* 138* 128*   Lipid Profile: Recent Labs    01/06/19 0510  CHOL 87  HDL 31*  LDLCALC 35  TRIG 103  CHOLHDL 2.8   Thyroid Function Tests: No results for input(s): TSH, T4TOTAL, FREET4, T3FREE,  THYROIDAB in the last 72 hours. Anemia Panel: No results for input(s): VITAMINB12, FOLATE, FERRITIN, TIBC, IRON, RETICCTPCT in the last 72 hours. Sepsis Labs: No results for input(s): PROCALCITON, LATICACIDVEN in the last 168 hours.  Recent Results (from the past 240 hour(s))  MRSA PCR Screening     Status: None   Collection Time: 01/05/19  1:03 PM  Result Value Ref Range Status   MRSA by PCR NEGATIVE NEGATIVE Final    Comment:        The GeneXpert MRSA Assay (FDA approved for NASAL specimens only), is one component of a comprehensive MRSA colonization surveillance program. It is not intended to diagnose MRSA infection nor to guide or monitor treatment for MRSA infections. Performed at [facility], [place] [address] [place], [place] [address]       Radiology Studies: Ct Head Wo Contrast  Result Date: 01/07/2019 CLINICAL DATA:  Follow up stroke. Status post endovascular revascularization of LEFT MCA occlusion history of breast cancer. EXAM: CT HEAD WITHOUT CONTRAST TECHNIQUE: Contiguous axial images were obtained from the base of the skull through  the vertex without intravenous contrast. COMPARISON:  CT HEAD January 06, 2018. FINDINGS: BRAIN: No intraparenchymal hemorrhage, mass effect nor midline shift. The ventricles and sulci are normal for age. Patchy supratentorial white matter hypodensities compatible with chronic small vessel ischemic changes. No acute large vascular territory infarcts. Stable 8 mm mixed density LEFT holo hemispheric subdural hematoma. Basal cisterns are patent. VASCULAR: Moderate calcific atherosclerosis of the carotid siphons. SKULL: No skull fracture. No significant scalp soft tissue swelling. SINUSES/ORBITS: Trace paranasal sinus mucosal thickening. Minimal mastoid effusions. Included ocular globes and orbital contents are non-suspicious. Status post bilateral ocular lens implants. OTHER: None. IMPRESSION: 1. No acute intracranial process. 2. Stable 8 mm  mixed density LEFT holo hemispheric subdural hematoma. Electronically Signed   By: Elon Alas M.D.   On: 01/07/2019 04:45        Scheduled Meds: . atorvastatin  20 mg Per Tube q1800  . fluticasone  2 spray Each Nare Daily  . gabapentin  100 mg Per Tube BID  . insulin aspart  0-15 Units Subcutaneous Q4H  . iopamidol  100 mL Intravenous Once  . levothyroxine  75 mcg Per Tube Q0600  . [START ON 01/09/2019] magnesium oxide  400 mg Per Tube Daily  . mouth rinse  15 mL Mouth Rinse BID  . pantoprazole sodium  40 mg Per Tube Daily  . potassium chloride  10 mEq Oral Daily  . potassium chloride  40 mEq Per Tube Once   Continuous Infusions: . potassium chloride        LOS: 3 days    Time spent:     Domenic Polite, MD  01/08/2019, 10:51 AM

## 2019-01-08 NOTE — Procedures (Signed)
Cortrak  Person Inserting Tube:  Maylon Peppers C, RD Tube Type:  Cortrak - 43 inches Tube Location:  Right nare Initial Placement:  Stomach Secured by: Bridle Technique Used to Measure Tube Placement:  Documented cm marking at nare/ corner of mouth Cortrak Secured At:  70 cm    Cortrak Tube Team Note:  Consult received to place a Cortrak feeding tube.   No x-ray is required. RN may begin using tube.   If the tube becomes dislodged please keep the tube and contact the Cortrak team at [email or web address] (password [email or web address]) for replacement.  If after hours and replacement cannot be delayed, place a NG tube and confirm placement with an abdominal x-ray.    [place], [place], [place] Pager [phone] After Hours Pager

## 2019-01-08 NOTE — Progress Notes (Signed)
Patient is not able to stand or follow commands, orthostatic vitals are not able to be performed due to patient condition., MD aware

## 2019-01-08 NOTE — Progress Notes (Signed)
Referring Physician(s): CODE STROKE- Kerney Elbe  Supervising Physician: Luanne Bras  Patient Status:  [facility] - In-pt  Chief Complaint: None  Subjective:  Left MCA M1 segment occlusion s/p emergent mechanical thrombectomy and balloon angioplasty x2 achieving a TICI 2 revascularization with reocclusion 01/05/2019 by Dr. Estanislado Pandy. Patient awake and alert laying in bed. Accompanied by husband and son. Demonstrates global aphasia. Demonstrates left gaze preference. Can spontaneously move left extremities but demonstrates minimal movement of right extremities (can wiggle toes of right side). Right groin and left groin incisions c/d/i.   Allergies: Morphine and related; Amiodarone; Codeine; Lovaza [omega-3-acid ethyl esters]; Zaroxolyn [metolazone]; Ivp dye [iodinated diagnostic agents]; Penicillins; and Sulfa antibiotics  Medications: Prior to Admission medications   Medication Sig Start Date End Date Taking? Authorizing Provider  Ascorbic Acid (VITAMIN C) 1000 MG tablet Take 1,000 mg by mouth daily.   Yes [provider]  atorvastatin (LIPITOR) 20 MG tablet Take 20 mg by mouth daily.   Yes [provider]  calcium citrate-vitamin D (CITRACAL+D) 315-200 MG-UNIT per tablet Take 1 tablet by mouth 2 (two) times daily.   Yes [provider]  cholecalciferol (VITAMIN D) 1000 UNITS tablet Take 2,000 Units by mouth daily.   Yes [provider]  fluticasone (FLONASE) 50 MCG/ACT nasal spray Place 2 sprays into both nostrils daily.   Yes [provider]  furosemide (LASIX) 40 MG tablet Take 20 mg by mouth 3 (three) times a week.    Yes [provider]  gabapentin (NEURONTIN) 300 MG capsule Take 300 mg by mouth 3 (three) times daily.   Yes [provider]  glimepiride (AMARYL) 2 MG tablet Take 2 mg by mouth daily with breakfast.   Yes [provider]  insulin glargine (LANTUS) 100 UNIT/ML injection Inject 5 Units  into the skin 2 (two) times daily.    Yes [provider]  levothyroxine (SYNTHROID, LEVOTHROID) 75 MCG tablet Take 75 mcg by mouth daily before breakfast.   Yes [provider]  loperamide (IMODIUM) 2 MG capsule Take 2 mg by mouth as needed for diarrhea or loose stools.   Yes [provider]  magnesium oxide (MAG-OX) 400 MG tablet Take 400 mg by mouth daily.   Yes [provider]  metoprolol succinate (TOPROL-XL) 50 MG 24 hr tablet Take 50 mg by mouth 2 (two) times daily.    Yes [provider]  Multiple Vitamins-Minerals (MULTIVITAMIN WITH MINERALS) tablet Take 1 tablet by mouth daily.   Yes [provider]  omega-3 acid ethyl esters (LOVAZA) 1 G capsule Take 1 g by mouth 2 (two) times daily.   Yes [provider]  omeprazole (PRILOSEC) 40 MG capsule Take 40 mg by mouth 2 (two) times daily.   Yes [provider]  polyethylene glycol (MIRALAX / GLYCOLAX) packet Take 17 g by mouth daily.   Yes [provider]  potassium chloride (KLOR-CON) 8 MEQ tablet Take 10 mEq by mouth daily.   Yes [provider]  sucralfate (CARAFATE) 1 g tablet Take 1 g by mouth 4 (four) times daily -  with meals and at bedtime.   Yes [provider]  vitamin E 400 UNIT capsule Take 400 Units by mouth daily.   Yes [provider]  warfarin (COUMADIN) 5 MG tablet Take 2.5 mg by mouth daily. 5MG -Wednesday AND Friday 2.5MG -ALL OTHER DAYS   Yes [provider]     Vital Signs: BP (!) 147/66 (BP Location: Right Arm)  Pulse 69   Temp 97.7 F (36.5 C) (Oral)   Resp 15   Ht 5\' 5"  (1.651 m)   Wt 156 lb 12 oz (71.1 kg)   SpO2 100%   BMI 26.08 kg/m   Physical Exam Vitals signs and nursing note reviewed.  Constitutional:      General: She is not in acute distress.    Appearance: Normal appearance.  Pulmonary:     Effort: Pulmonary effort is normal. No respiratory distress.  Skin:    General: Skin is  warm and dry.     Comments: Right and left groin incisions soft without active bleeding or hematoma.  Neurological:     Mental Status: She is alert.     Comments: Awake and alert but does not follow simple commands. Demonstrates global aphasia. PERRL bilaterally. Demonstrates left gaze preference. Visual fields not assessed. No facial asymmetry. Unable to protrude tongue (she does not follow commands). Can spontaneously move left extremities but demonstrates minimal movement of right extremities (can wiggle toes of right side). Pronator drift not assessed. Fine motor and coordination not assessed. Gait not assessed. Romberg not assessed. Heel to toe not assessed. Distal pulses 2+ bilaterally.  Psychiatric:     Comments: Global aphasia.     Imaging: Ct Angio Head W Or Wo Contrast  Result Date: 01/05/2019 CLINICAL DATA:  Focal neuro deficit with stroke suspected EXAM: CT ANGIOGRAPHY HEAD AND NECK TECHNIQUE: Multidetector CT imaging of the head and neck was performed using the standard protocol during bolus administration of intravenous contrast. Multiplanar CT image reconstructions and MIPs were obtained to evaluate the vascular anatomy. Carotid stenosis measurements (when applicable) are obtained utilizing NASCET criteria, using the distal internal carotid diameter as the denominator. CONTRAST:  31mL ISOVUE-370 IOPAMIDOL (ISOVUE-370) INJECTION 76% COMPARISON:  Noncontrast head CT from earlier today FINDINGS: CTA NECK FINDINGS Aortic arch: Atherosclerosis.  Three vessel branching. Right carotid system: Mild atherosclerotic plaque. No stenosis or ulceration. Left carotid system: Mild atherosclerotic plaque. No stenosis or ulceration. Vertebral arteries: Proximal subclavian atherosclerosis without flow limiting stenosis. A tubal tortuosity. Both vertebral arteries are widely patent to the dura. Skeleton: Negative Other neck: No acute finding. Upper chest: Layering right pleural effusion. Review  of the MIP images confirms the above findings CTA HEAD FINDINGS Anterior circulation: Left M1 to M2 occlusion with acute appearance causing a proximal meniscus sign. There is downstream under filled reconstitution vessels. Atherosclerotic calcification of the carotid siphons. Posterior circulation: Symmetric vertebral arteries. The vertebral and basilar arteries are smooth and widely patent. No branch occlusion or flow limiting stenosis. Venous sinuses: Patent as permitted by contrast timing Anatomic variants: None significant. Delayed phase: No abnormal intracranial enhancement. Known mixed density subdural hematoma along the left cerebral convexity. Review of the MIP images confirms the above findings Critical Value/emergent results were called by telephone at the time of interpretation on 01/05/2019 at 7:53 am to Dr. Delora Fuel , who verbally acknowledged these results. IMPRESSION: 1. 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 There is underfilling of the reconstituted downstream vessels. 2. Mixed density subdural hematoma along the left cerebral convexity, see preceding noncontrast head CT. 3. Limited atherosclerosis for age. No flow limiting stenosis or ulceration. Electronically Signed   By: Monte Fantasia M.D.   On: 01/05/2019 07:59   Dg Chest 2 View  Result Date: 01/05/2019 CLINICAL DATA:  Initial evaluation for acute trauma, fall. EXAM: CHEST - 2 VIEW COMPARISON:  Prior radiograph from 09/02/2018 FINDINGS: There has been interval placement of  a triple lead transvenous pacemaker/AICD. Cardiomegaly is relatively stable. Mediastinal silhouette within normal limits. Aortic atherosclerosis. Lungs hypoinflated. No focal infiltrates. No pulmonary edema. Small layering right pleural effusion seen posteriorly on lateral projection. No pneumothorax. No acute osseous finding. Multiple surgical clips overlie the left axilla. IMPRESSION: 1. Small layering right pleural effusion. 2. No other active  cardiopulmonary disease. Electronically Signed   By: Jeannine Boga M.D.   On: 01/05/2019 06:15   Dg Clavicle Left  Result Date: 01/05/2019 CLINICAL DATA:  Initial evaluation for acute left clavicular pain status post fall. EXAM: LEFT CLAVICLE - 2+ VIEWS COMPARISON:  None. FINDINGS: No acute fracture or dislocation. Osteoarthritic changes present at the left [facility] joint. Pacemaker electrodes partially overlie the clavicle. Visualized left lung apex clear. No soft tissue abnormality. IMPRESSION: No acute osseous abnormality about the left clavicle. Electronically Signed   By: Jeannine Boga M.D.   On: 01/05/2019 06:17   Ct Head Wo Contrast  Addendum Date: 01/07/2019   ADDENDUM REPORT: 01/07/2019 04:46 ADDENDUM: Correction: FINDINGS: Similar to decreased 8 mm LEFT (not RIGHT) holo hemispheric mixed density subdural hematoma. Electronically Signed   By: Elon Alas M.D.   On: 01/07/2019 04:46   Result Date: 01/07/2019 CLINICAL DATA:  Follow up stroke. Status post endovascular revascularization of LEFT MCA occlusion. EXAM: CT HEAD WITHOUT CONTRAST TECHNIQUE: Contiguous axial images were obtained from the base of the skull through the vertex without intravenous contrast. COMPARISON:  None. FINDINGS: BRAIN: No intraparenchymal hemorrhage, mass effect nor midline shift. The ventricles and sulci are normal for age. Patchy supratentorial white matter hypodensities within normal range for patient's age, though non-specific are most compatible with chronic small vessel ischemic disease. No acute large vascular territory infarcts. Similar to decreased 8 mm RIGHT holo hemispheric mixed density subdural hematoma. VASCULAR: Moderate calcific atherosclerosis of the carotid siphons. Asymmetrically dense LEFT MCA best appreciated on sagittal series. The SKULL: No skull fracture. No significant scalp soft tissue swelling. SINUSES/ORBITS: Paranasal sinuses are well aerated. Trace bilateral mastoid effusions.  Included ocular globes and orbital contents are non-suspicious. Status post bilateral ocular lens implants. OTHER: None. IMPRESSION: 1. Dense LEFT MCA: Recurrent occlusion possible versus enhancing residual thrombus. 2. No acute infarct. 3. Similar to decreased mixed density LEFT holo hemispheric subdural hematoma. 4. These results will be called to the ordering clinician or representative by the professional radiologist assistant, and communication documented in [employer] Dashboard. Electronically Signed: By: Elon Alas M.D. On: 01/06/2019 05:14   Ct Head Wo Contrast  Result Date: 01/07/2019 CLINICAL DATA:  Follow up stroke. Status post endovascular revascularization of LEFT MCA occlusion history of breast cancer. EXAM: CT HEAD WITHOUT CONTRAST TECHNIQUE: Contiguous axial images were obtained from the base of the skull through the vertex without intravenous contrast. COMPARISON:  CT HEAD January 06, 2018. FINDINGS: BRAIN: No intraparenchymal hemorrhage, mass effect nor midline shift. The ventricles and sulci are normal for age. Patchy supratentorial white matter hypodensities compatible with chronic small vessel ischemic changes. No acute large vascular territory infarcts. Stable 8 mm mixed density LEFT holo hemispheric subdural hematoma. Basal cisterns are patent. VASCULAR: Moderate calcific atherosclerosis of the carotid siphons. SKULL: No skull fracture. No significant scalp soft tissue swelling. SINUSES/ORBITS: Trace paranasal sinus mucosal thickening. Minimal mastoid effusions. Included ocular globes and orbital contents are non-suspicious. Status post bilateral ocular lens implants. OTHER: None. IMPRESSION: 1. No acute intracranial process. 2. Stable 8 mm mixed density LEFT holo hemispheric subdural hematoma. Electronically Signed   By: Thana Farr.D.  On: 01/07/2019 04:45   Ct Head Wo Contrast  Result Date: 01/05/2019 CLINICAL DATA:  83 year old female fell off toilet tonight. Garbled  speech and mild facial droop. Initial encounter. EXAM: CT HEAD WITHOUT CONTRAST CT CERVICAL SPINE WITHOUT CONTRAST TECHNIQUE: Multidetector CT imaging of the head and cervical spine was performed following the standard protocol without intravenous contrast. Multiplanar CT image reconstructions of the cervical spine were also generated. COMPARISON:  None. FINDINGS: CT HEAD FINDINGS Brain: Complex broad-base left convexity subdural hematoma. Largest component appears hypodense (possibly chronic) measuring 10.1 mm maximal thickness. More acute appearing component (which is slightly hyperdense) measures up to 4.3 mm. Mild local mass effect upon adjacent frontal-parietal gyri. Minimal bowing of the septum to the right. Thin left tentorial subdural hematoma. Tubular hyperdensity along the course of the left middle cerebral artery M1 segment may be related to thrombus or possibly tiny amount of blood. Chronic microvascular changes. Global atrophy. Vascular: As above. Skull: No skull fracture noted. Sinuses/Orbits: No acute orbital abnormality. Visualized paranasal sinuses are clear. Other: Mastoid air cells and middle ear cavities are clear. CT CERVICAL SPINE FINDINGS Alignment: Slight scoliosis convex left. Minimal anterior slip C4 secondary to right-sided facet degenerative changes. Skull base and vertebrae: No cervical spine fracture. Nonspecific 7 mm right C4 and C6 vertebral body lucency Soft tissues and spinal canal: No abnormal prevertebral soft tissue swelling. Disc levels: Scattered cervical spondylotic changes, most notable C6-7. No high-grade spinal stenosis. Upper chest: No worrisome abnormality Other: No worrisome abnormality IMPRESSION: CT HEAD: 1. Complex broad-base left convexity subdural hematoma. Largest component appears hypodense (possibly chronic) measuring 10.1 mm maximal thickness. More acute appearing component (which is slightly hyperdense) measures up to 4.3 mm. Mild local mass effect upon  adjacent frontal-parietal gyri. Minimal bowing of the septum to the right. No skull fracture. 2. Thin left tentorial subdural hematoma. 3. Tubular hyperdensity along the course of the left middle cerebral artery M1 segment may be related to thrombus or possibly tiny amount of blood. 4. Chronic microvascular changes. 5. Global atrophy. CT CERVICAL SPINE: 1. Slight scoliosis convex left. Minimal anterior slip C4 secondary to right-sided facet degenerative changes. 2. No cervical spine fracture noted. No abnormal prevertebral soft tissue swelling. 3. Nonspecific 7 mm right C4 and C6 vertebral body lucency. These results were called by telephone at the time of interpretation on 01/05/2019 at 6:01 am to Dr. Delora Fuel , who verbally acknowledged these results. Electronically Signed   By: Genia Del M.D.   On: 01/05/2019 06:19   Ct Angio Neck W And/or Wo Contrast  Result Date: 01/05/2019 CLINICAL DATA:  Focal neuro deficit with stroke suspected EXAM: CT ANGIOGRAPHY HEAD AND NECK TECHNIQUE: Multidetector CT imaging of the head and neck was performed using the standard protocol during bolus administration of intravenous contrast. Multiplanar CT image reconstructions and MIPs were obtained to evaluate the vascular anatomy. Carotid stenosis measurements (when applicable) are obtained utilizing NASCET criteria, using the distal internal carotid diameter as the denominator. CONTRAST:  57mL ISOVUE-370 IOPAMIDOL (ISOVUE-370) INJECTION 76% COMPARISON:  Noncontrast head CT from earlier today FINDINGS: CTA NECK FINDINGS Aortic arch: Atherosclerosis.  Three vessel branching. Right carotid system: Mild atherosclerotic plaque. No stenosis or ulceration. Left carotid system: Mild atherosclerotic plaque. No stenosis or ulceration. Vertebral arteries: Proximal subclavian atherosclerosis without flow limiting stenosis. A tubal tortuosity. Both vertebral arteries are widely patent to the dura. Skeleton: Negative Other neck: No acute  finding. Upper chest: Layering right pleural effusion. Review of the MIP images confirms the  above findings CTA HEAD FINDINGS Anterior circulation: Left M1 to M2 occlusion with acute appearance causing a proximal meniscus sign. There is downstream under filled reconstitution vessels. Atherosclerotic calcification of the carotid siphons. Posterior circulation: Symmetric vertebral arteries. The vertebral and basilar arteries are smooth and widely patent. No branch occlusion or flow limiting stenosis. Venous sinuses: Patent as permitted by contrast timing Anatomic variants: None significant. Delayed phase: No abnormal intracranial enhancement. Known mixed density subdural hematoma along the left cerebral convexity. Review of the MIP images confirms the above findings Critical Value/emergent results were called by telephone at the time of interpretation on 01/05/2019 at 7:53 am to Dr. Delora Fuel , who verbally acknowledged these results. IMPRESSION: 1. 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 There is underfilling of the reconstituted downstream vessels. 2. Mixed density subdural hematoma along the left cerebral convexity, see preceding noncontrast head CT. 3. Limited atherosclerosis for age. No flow limiting stenosis or ulceration. Electronically Signed   By: Monte Fantasia M.D.   On: 01/05/2019 07:59   Ct Cervical Spine Wo Contrast  Result Date: 01/05/2019 CLINICAL DATA:  83 year old female fell off toilet tonight. Garbled speech and mild facial droop. Initial encounter. EXAM: CT HEAD WITHOUT CONTRAST CT CERVICAL SPINE WITHOUT CONTRAST TECHNIQUE: Multidetector CT imaging of the head and cervical spine was performed following the standard protocol without intravenous contrast. Multiplanar CT image reconstructions of the cervical spine were also generated. COMPARISON:  None. FINDINGS: CT HEAD FINDINGS Brain: Complex broad-base left convexity subdural hematoma. Largest component appears hypodense  (possibly chronic) measuring 10.1 mm maximal thickness. More acute appearing component (which is slightly hyperdense) measures up to 4.3 mm. Mild local mass effect upon adjacent frontal-parietal gyri. Minimal bowing of the septum to the right. Thin left tentorial subdural hematoma. Tubular hyperdensity along the course of the left middle cerebral artery M1 segment may be related to thrombus or possibly tiny amount of blood. Chronic microvascular changes. Global atrophy. Vascular: As above. Skull: No skull fracture noted. Sinuses/Orbits: No acute orbital abnormality. Visualized paranasal sinuses are clear. Other: Mastoid air cells and middle ear cavities are clear. CT CERVICAL SPINE FINDINGS Alignment: Slight scoliosis convex left. Minimal anterior slip C4 secondary to right-sided facet degenerative changes. Skull base and vertebrae: No cervical spine fracture. Nonspecific 7 mm right C4 and C6 vertebral body lucency Soft tissues and spinal canal: No abnormal prevertebral soft tissue swelling. Disc levels: Scattered cervical spondylotic changes, most notable C6-7. No high-grade spinal stenosis. Upper chest: No worrisome abnormality Other: No worrisome abnormality IMPRESSION: CT HEAD: 1. Complex broad-base left convexity subdural hematoma. Largest component appears hypodense (possibly chronic) measuring 10.1 mm maximal thickness. More acute appearing component (which is slightly hyperdense) measures up to 4.3 mm. Mild local mass effect upon adjacent frontal-parietal gyri. Minimal bowing of the septum to the right. No skull fracture. 2. Thin left tentorial subdural hematoma. 3. Tubular hyperdensity along the course of the left middle cerebral artery M1 segment may be related to thrombus or possibly tiny amount of blood. 4. Chronic microvascular changes. 5. Global atrophy. CT CERVICAL SPINE: 1. Slight scoliosis convex left. Minimal anterior slip C4 secondary to right-sided facet degenerative changes. 2. No cervical spine  fracture noted. No abnormal prevertebral soft tissue swelling. 3. Nonspecific 7 mm right C4 and C6 vertebral body lucency. These results were called by telephone at the time of interpretation on 01/05/2019 at 6:01 am to Dr. Delora Fuel , who verbally acknowledged these results. Electronically Signed   By: Alcide Evener.D.  On: 01/05/2019 06:19   Ir Pta Intracranial  Result Date: 01/08/2019 INDICATION: New onset dysphagia, right-sided weakness and left gaze deviation. 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 EXAM: 1. EMERGENT LARGE VESSEL OCCLUSION THROMBOLYSIS (anterior CIRCULATION) 2. Intracranial balloon angioplasty of the left middle cerebral artery. COMPARISON:  CT angiogram of the head and neck of 01/05/2019. MEDICATIONS: Vancomycin 1 g IV antibiotic was administered within 1 hour of the procedure. ANESTHESIA/SEDATION: General anesthesia. CONTRAST:  Isovue 300 approximately 120 cc. FLUOROSCOPY TIME:  Fluoroscopy Time: 83 minutes 12 seconds (3019 mGy). COMPLICATIONS: None immediate. TECHNIQUE: Following a full explanation of the procedure along with the potential associated complications, an informed witnessed consent was obtained from the patient's daughter and husband. The risks of intracranial hemorrhage of 10%, worsening neurological deficit, ventilator dependency, death and inability to revascularize were all reviewed in detail with the patient's daughter and husband. The patient was then put under general anesthesia by the Department of Anesthesiology at [facility]. The right groin was prepped and draped in the usual sterile fashion. Thereafter using modified Seldinger technique, transfemoral access into the right common femoral artery was obtained without difficulty. Over a 0.035 inch guidewire a 5 French Pinnacle sheath was inserted. Through this, and also over a 0.035 inch guidewire a 5 [place] JB 1 catheter was advanced to the aortic arch region and selectively  positioned in the left common carotid artery. FINDINGS: The left common carotid arteriogram demonstrates the left external carotid artery and its major branches to be widely patent. The left internal carotid artery at the bulb to the cranial skull base demonstrates wide patency with mild tortuosity proximally. The petrous, cavernous and supraclinoid segments are widely patent. The left middle cerebral artery demonstrates complete angiographic occlusion in the mid M1 segment at the origin of the anterior temporal branch. The left anterior cerebral artery is seen to opacify into the capillary and venous phases. The delayed arterial phase demonstrates retrograde opacification of the distal left MCA distribution from the pericallosal and callosal marginal branches. Retrograde opacification is noted into the M3 regions of the perisylvian branches. PROCEDURE: ENDOVASCULAR REVASCULARIZATION OF OCCLUDED LEFT MIDDLE CEREBRAL ARTERY M1 SEGMENT WITH MECHANICAL THROMBECTOMY, AND ALSO BALLOON ANGIOPLASTY FOR RECURRENT OCCLUSION. The diagnostic JB 1 catheter in the left common carotid artery was exchanged over a 0.035 inch 300 cm Rosen exchange guidewire for an 8 French 55 cm Brite tip neurovascular sheath using biplane roadmap technique and constant fluoroscopic guidance. Good aspiration was obtained from the side port of the neurovascular sheath. This was then connected to continuous heparinized saline infusion. Over the [employer] guidewire, an 8 [place] 85 cm balloon FlowGate guide catheter which had been prepped with 50% contrast and 50% heparinized saline infusion was advanced and positioned in the mid cervical left ICA. The guidewire was removed. Good aspiration obtained from the hub of the [facility] guide catheter. A gentle control arteriogram performed through [facility] guide catheter in the left internal carotid artery demonstrated no evidence of spasms, dissections or of intraluminal filling defects. No change was  seen in the left MCA occlusion. In a coaxial manner and with constant heparinized saline infusion using biplane roadmap technique and constant fluoroscopic guidance, a combination of a 6 French 132 cm Catalyst guide catheter inside of which was an 021 Trevo ProVue microcatheter was advanced over a 0.014 inch Softip Synchro micro guidewire to the distal end of the [facility] guide catheter in the left internal carotid artery. With the micro guidewire leading with  a J-tip configuration, the combination was navigated to the supraclinoid left ICA. The occluded left middle cerebral artery was then crossed with the micro guidewire without difficulty into the inferior division M2 M3 region followed by the microcatheter. The guidewire was removed. Good aspiration was obtained from the hub of the microcatheter. This was then connected to continuous heparinized saline infusion. A 33 mm x 5 mm Embotrap retrieval device was then advanced into the distal end of the microcatheter. The proximal and the distal landing zones were then defined. The O ring on the delivery microcatheter was then loosened. With slight forward traction with the right hand on the delivery micro guidewire, with the left hand the delivery microcatheter was retrieved unsheathing the distal and then the proximal portion of the retrieval device. A gentle control arteriogram performed through Catalyst guide catheter in the supraclinoid left ICA demonstrated a revascularization. With proximal flow arrest in the left internal carotid artery by inflating the balloon of the [facility] guide catheter, the combination of the retrieval device, the microcatheter and the 6 [place] Catalyst guide catheter were retrieved and removed as aspiration was continued as the balloon was deflated in the left internal carotid artery. Aspiration was performed with a 60 mL syringe at the hub of the [facility] guide catheter, and with a Prenumbra aspiration device the hub of the 6 [place]  Catalyst guide catheter. Free back bleed of blood was noted at the hub of the [facility] guide catheter. A gentle control arteriogram performed through the [facility] guide catheter in the left internal carotid artery demonstrated no change in the occluded left middle cerebral artery. No evidence of clots or debris was found in the retrieval device or the aspirate. Two more attempts at mechanical thrombectomy were then made. A second attempt was again using the combination as described above. Again after having established safe position of tip of the microcatheter and placement of the 5 mm x 33 mm Embotrap retrieval device, with proximal flow arrest in the left internal carotid, and aspiration at the hub of the [facility] guide catheter with a 60 mL syringe, and with a Penumbra aspiration catheter at the hub of the 6 [place] Catalyst guide catheter, again with proximal flow arrest, the combination was retrieved and removed without evidence of clots in the aspirate or the retrieval device. A control arteriogram performed following this continued to demonstrate occluded left middle cerebral artery. A third attempt was made with using a combination of a 7 [place] Catalyst guide catheter inside of which was an 021 Trevo ProVue microcatheter again advanced over a 0.014 inch Softip Synchro micro guidewire to the distal end of the supraclinoid left ICA. Again access was obtained with the micro guidewire into M2 M3 region of the dominant inferior division followed by the microcatheter. After having established safe position of the tip of the microcatheter, and connected to heparinized saline infusion, a 4 mm x 40 mm Solitaire FR retrieval device was then advanced and deployed in a predetermined position. Again a control arteriogram performed through the Catalyst guide catheter in the supraclinoid left ICA continued to demonstrate a TICI 2b revascularization while the device was opened. Thereafter with proximal flow arrest, the  combination of the retrieval device, the microcatheter and the 7 [place] Catalyst guide catheter was retrieved and removed as aspiration was applied with a 60 mL syringe at the hub of the [facility] guide catheter, and with a Penumbra device at the hub of the 7 [place] Catalyst guide catheter the combination was retrieved  and removed. Back bleed was allowed after reversal of aspiration. At this time there were a few flecks of whitish material noted in the aspirate. 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 It was felt that the underlying pathology was probably arteriosclerotic disease with significant stenosis. Placement of a stent across the tight occlusion of the left middle cerebral artery was felt to be potentially riskier in terms of post reperfusion hemorrhage and also with the patient having to be given dual antiplatelets. With the patient already being on warfarin, and also a history of subdural hematoma overlying the cerebral convexity on the left side, it was felt to proceed with intracranial angioplasty. A Gateway 1.5 mm x 15 mm balloon was then prepped and purged with 50% contrast and 50% heparinized saline infusion. This was then advanced inside of an intermediary catheter to the supraclinoid left ICA over a 0.014 inch Softip Synchro micro guidewire. The micro guidewire was then advanced through the occluded left middle cerebral artery followed by the placement of the balloon. An angioplasty was then performed with a micro inflation syringe device via micro tubing inflating the balloon to just over 6 atmospheres achieving a 1.6 mm diameter where a balloon was maintained for approximately a minute and a half. It was then deflated and retrieved slightly proximally with the wire maintained distally. A control arteriogram performed demonstrated now significantly  improved caliber and flow through the angioplastied segment with a TICI 2b revascularization. An angiogram performed 3 minutes later demonstrated progressive worsening of the angioplastied segment prompting a second angioplasty again with the balloon being inflated gradually to 6.1 atmospheres achieving a diameter of just over 1.6 mm where it was maintained for approximately a minute. The balloon was then deflated and retrieved proximally. A control arteriogram performed through the intermediary catheter in the left internal carotid artery demonstrated again significantly improved caliber and flow through the angioplastied segment of the left middle cerebral artery. A TICI 2b revascularization was established. A control arteriogram performed approximately 5 minutes after this again demonstrated complete occlusion of the left middle cerebral artery. At this time it was decided to stop as placement of the stent was not an option as per discussion above. The whole head arteriogram performed demonstrated no change in the previously noted robust collaterals from the anterior cerebral artery distribution. Throughout the procedure, the patient's blood pressure and neurological status remained stable. No angiographic evidence of extravasation or mass-effect or midline shift was noted. A CT of the head performed on the table demonstrated no gross mass effect, midline shift or of intracranial hemorrhage. Stability of the previously noted subdural hematoma was seen. The 8 [place] FlowGate guide catheter and the 8 French neurovascular sheath were then retrieved over a J-wire and replaced with an [place] sheath. This in turn was then removed with successful hemostasis with the an 8 [place] Angio-Seal device. Distal pulses remained Dopplerable in the dorsalis pedis, and posterior regions bilaterally. The patient's general anesthesia was then reversed and the patient was extubated. Upon extubation the patient was able to  maintain adequate oxygenation. She was able to move to some degree her left-side spontaneously. No significant motion was noted in the right upper or right lower extremity. Her pupils were 2 mm and equal. She was then transferred to the neuro ICU for further workup. IMPRESSION: Status post endovascular revascularization of occluded left middle cerebral artery with  2 passes with the 5 mm x 33 mm Embotrap retrieval device, and 1 pass with the 4 mm x 40 mm retrieval device, achieving a TICI 2b revascularization with subsequent reocclusion. Balloon angioplasty of occluded left middle cerebral artery probably due to underlying severe stenosis due to atherosclerosis with 2 passes again achieving a TICI 2b revascularization with subsequent reocclusion. PLAN: Follow-up outpatient clinic 4 weeks post discharge. Electronically Signed   By: Luanne Bras M.D.   On: 01/06/2019 13:41   [place]  Result Date: 01/08/2019 INDICATION: New onset dysphagia, right-sided weakness and left gaze deviation. 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 EXAM: 1. EMERGENT LARGE VESSEL OCCLUSION THROMBOLYSIS (anterior CIRCULATION) 2. Intracranial balloon angioplasty of the left middle cerebral artery. COMPARISON:  CT angiogram of the head and neck of 01/05/2019. MEDICATIONS: Vancomycin 1 g IV antibiotic was administered within 1 hour of the procedure. ANESTHESIA/SEDATION: General anesthesia. CONTRAST:  Isovue 300 approximately 120 cc. FLUOROSCOPY TIME:  Fluoroscopy Time: 83 minutes 12 seconds (3019 mGy). COMPLICATIONS: None immediate. TECHNIQUE: Following a full explanation of the procedure along with the potential associated complications, an informed witnessed consent was obtained from the patient's daughter and husband. The risks of intracranial hemorrhage of 10%, worsening neurological deficit, ventilator dependency, death and inability to revascularize were all reviewed in detail with the patient's  daughter and husband. The patient was then put under general anesthesia by the Department of Anesthesiology at [facility]. The right groin was prepped and draped in the usual sterile fashion. Thereafter using modified Seldinger technique, transfemoral access into the right common femoral artery was obtained without difficulty. Over a 0.035 inch guidewire a 5 French Pinnacle sheath was inserted. Through this, and also over a 0.035 inch guidewire a 5 [place] JB 1 catheter was advanced to the aortic arch region and selectively positioned in the left common carotid artery. FINDINGS: The left common carotid arteriogram demonstrates the left external carotid artery and its major branches to be widely patent. The left internal carotid artery at the bulb to the cranial skull base demonstrates wide patency with mild tortuosity proximally. The petrous, cavernous and supraclinoid segments are widely patent. The left middle cerebral artery demonstrates complete angiographic occlusion in the mid M1 segment at the origin of the anterior temporal branch. The left anterior cerebral artery is seen to opacify into the capillary and venous phases. The delayed arterial phase demonstrates retrograde opacification of the distal left MCA distribution from the pericallosal and callosal marginal branches. Retrograde opacification is noted into the M3 regions of the perisylvian branches. PROCEDURE: ENDOVASCULAR REVASCULARIZATION OF OCCLUDED LEFT MIDDLE CEREBRAL ARTERY M1 SEGMENT WITH MECHANICAL THROMBECTOMY, AND ALSO BALLOON ANGIOPLASTY FOR RECURRENT OCCLUSION. The diagnostic JB 1 catheter in the left common carotid artery was exchanged over a 0.035 inch 300 cm Rosen exchange guidewire for an 8 French 55 cm Brite tip neurovascular sheath using biplane roadmap technique and constant fluoroscopic guidance. Good aspiration was obtained from the side port of the neurovascular sheath. This was then connected to continuous heparinized  saline infusion. Over the [employer] guidewire, an 8 [place] 85 cm balloon FlowGate guide catheter which had been prepped with 50% contrast and 50% heparinized saline infusion was advanced and positioned in the mid cervical left ICA. The guidewire was removed. Good aspiration obtained from the hub of the [facility] guide catheter. A gentle control arteriogram performed through [facility] guide catheter in the left internal carotid artery demonstrated no evidence of spasms, dissections or of  intraluminal filling defects. No change was seen in the left MCA occlusion. In a coaxial manner and with constant heparinized saline infusion using biplane roadmap technique and constant fluoroscopic guidance, a combination of a 6 [place] 132 cm Catalyst guide catheter inside of which was an 021 Trevo ProVue microcatheter was advanced over a 0.014 inch Softip Synchro micro guidewire to the distal end of the [facility] guide catheter in the left internal carotid artery. With the micro guidewire leading with a J-tip configuration, the combination was navigated to the supraclinoid left ICA. The occluded left middle cerebral artery was then crossed with the micro guidewire without difficulty into the inferior division M2 M3 region followed by the microcatheter. The guidewire was removed. Good aspiration was obtained from the hub of the microcatheter. This was then connected to continuous heparinized saline infusion. A 33 mm x 5 mm Embotrap retrieval device was then advanced into the distal end of the microcatheter. The proximal and the distal landing zones were then defined. The O ring on the delivery microcatheter was then loosened. With slight forward traction with the right hand on the delivery micro guidewire, with the left hand the delivery microcatheter was retrieved unsheathing the distal and then the proximal portion of the retrieval device. A gentle control arteriogram performed through Catalyst guide catheter in the  supraclinoid left ICA demonstrated a revascularization. With proximal flow arrest in the left internal carotid artery by inflating the balloon of the [facility] guide catheter, the combination of the retrieval device, the microcatheter and the 6 [place] Catalyst guide catheter were retrieved and removed as aspiration was continued as the balloon was deflated in the left internal carotid artery. Aspiration was performed with a 60 mL syringe at the hub of the [facility] guide catheter, and with a Prenumbra aspiration device the hub of the 6 [place] Catalyst guide catheter. Free back bleed of blood was noted at the hub of the [facility] guide catheter. A gentle control arteriogram performed through the [facility] guide catheter in the left internal carotid artery demonstrated no change in the occluded left middle cerebral artery. No evidence of clots or debris was found in the retrieval device or the aspirate. Two more attempts at mechanical thrombectomy were then made. A second attempt was again using the combination as described above. Again after having established safe position of tip of the microcatheter and placement of the 5 mm x 33 mm Embotrap retrieval device, with proximal flow arrest in the left internal carotid, and aspiration at the hub of the [facility] guide catheter with a 60 mL syringe, and with a Penumbra aspiration catheter at the hub of the 6 [place] Catalyst guide catheter, again with proximal flow arrest, the combination was retrieved and removed without evidence of clots in the aspirate or the retrieval device. A control arteriogram performed following this continued to demonstrate occluded left middle cerebral artery. A third attempt was made with using a combination of a 7 [place] Catalyst guide catheter inside of which was an 021 Trevo ProVue microcatheter again advanced over a 0.014 inch Softip Synchro micro guidewire to the distal end of the supraclinoid left ICA. Again access was obtained with the micro  guidewire into M2 M3 region of the dominant inferior division followed by the microcatheter. After having established safe position of the tip of the microcatheter, and connected to heparinized saline infusion, a 4 mm x 40 mm Solitaire FR retrieval device was then advanced and deployed in a predetermined position. Again a control arteriogram performed through  the Catalyst guide catheter in the supraclinoid left ICA continued to demonstrate a TICI 2b revascularization while the device was opened. Thereafter with proximal flow arrest, the combination of the retrieval device, the microcatheter and the 7 [place] Catalyst guide catheter was retrieved and removed as aspiration was applied with a 60 mL syringe at the hub of the [facility] guide catheter, and with a Penumbra device at the hub of the 7 [place] Catalyst guide catheter the combination was retrieved and removed. Back bleed was allowed after reversal of aspiration. At this time there were a few flecks of whitish material noted in the aspirate. 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 It was felt that the underlying pathology was probably arteriosclerotic disease with significant stenosis. Placement of a stent across the tight occlusion of the left middle cerebral artery was felt to be potentially riskier in terms of post reperfusion hemorrhage and also with the patient having to be given dual antiplatelets. With the patient already being on warfarin, and also a history of subdural hematoma overlying the cerebral convexity on the left side, it was felt to proceed with intracranial angioplasty. A Gateway 1.5 mm x 15 mm balloon was then prepped and purged with 50% contrast and 50% heparinized saline infusion. This was then advanced inside of an intermediary catheter to the supraclinoid left ICA over a 0.014 inch Softip  Synchro micro guidewire. The micro guidewire was then advanced through the occluded left middle cerebral artery followed by the placement of the balloon. An angioplasty was then performed with a micro inflation syringe device via micro tubing inflating the balloon to just over 6 atmospheres achieving a 1.6 mm diameter where a balloon was maintained for approximately a minute and a half. It was then deflated and retrieved slightly proximally with the wire maintained distally. A control arteriogram performed demonstrated now significantly improved caliber and flow through the angioplastied segment with a TICI 2b revascularization. An angiogram performed 3 minutes later demonstrated progressive worsening of the angioplastied segment prompting a second angioplasty again with the balloon being inflated gradually to 6.1 atmospheres achieving a diameter of just over 1.6 mm where it was maintained for approximately a minute. The balloon was then deflated and retrieved proximally. A control arteriogram performed through the intermediary catheter in the left internal carotid artery demonstrated again significantly improved caliber and flow through the angioplastied segment of the left middle cerebral artery. A TICI 2b revascularization was established. A control arteriogram performed approximately 5 minutes after this again demonstrated complete occlusion of the left middle cerebral artery. At this time it was decided to stop as placement of the stent was not an option as per discussion above. The whole head arteriogram performed demonstrated no change in the previously noted robust collaterals from the anterior cerebral artery distribution. Throughout the procedure, the patient's blood pressure and neurological status remained stable. No angiographic evidence of extravasation or mass-effect or midline shift was noted. A CT of the head performed on the table demonstrated no gross mass effect, midline shift or of intracranial  hemorrhage. Stability of the previously noted subdural hematoma was seen. The 8 [place] FlowGate guide catheter and the 8 French neurovascular sheath were then retrieved over a J-wire and replaced with an [place] sheath. This in turn was then removed with successful hemostasis with the an 8 [place] Angio-Seal device. Distal pulses remained Dopplerable in the dorsalis  pedis, and posterior regions bilaterally. The patient's general anesthesia was then reversed and the patient was extubated. Upon extubation the patient was able to maintain adequate oxygenation. She was able to move to some degree her left-side spontaneously. No significant motion was noted in the right upper or right lower extremity. Her pupils were 2 mm and equal. She was then transferred to the neuro ICU for further workup. IMPRESSION: Status post endovascular revascularization of occluded left middle cerebral artery with 2 passes with the 5 mm x 33 mm Embotrap retrieval device, and 1 pass with the 4 mm x 40 mm retrieval device, achieving a TICI 2b revascularization with subsequent reocclusion. Balloon angioplasty of occluded left middle cerebral artery probably due to underlying severe stenosis due to atherosclerosis with 2 passes again achieving a TICI 2b revascularization with subsequent reocclusion. PLAN: Follow-up outpatient clinic 4 weeks post discharge. Electronically Signed   By: Luanne Bras M.D.   On: 01/06/2019 13:41   [place]  Result Date: 01/08/2019 INDICATION: New onset dysphagia, right-sided weakness and left gaze deviation. 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 EXAM: 1. EMERGENT LARGE VESSEL OCCLUSION THROMBOLYSIS (anterior CIRCULATION) 2. Intracranial balloon angioplasty of the left middle cerebral artery. COMPARISON:  CT angiogram of the head and neck of 01/05/2019. MEDICATIONS: Vancomycin 1 g IV antibiotic was administered within 1 hour of the procedure. ANESTHESIA/SEDATION:  General anesthesia. CONTRAST:  Isovue 300 approximately 120 cc. FLUOROSCOPY TIME:  Fluoroscopy Time: 83 minutes 12 seconds (3019 mGy). COMPLICATIONS: None immediate. TECHNIQUE: Following a full explanation of the procedure along with the potential associated complications, an informed witnessed consent was obtained from the patient's daughter and husband. The risks of intracranial hemorrhage of 10%, worsening neurological deficit, ventilator dependency, death and inability to revascularize were all reviewed in detail with the patient's daughter and husband. The patient was then put under general anesthesia by the Department of Anesthesiology at [facility]. The right groin was prepped and draped in the usual sterile fashion. Thereafter using modified Seldinger technique, transfemoral access into the right common femoral artery was obtained without difficulty. Over a 0.035 inch guidewire a 5 [place] Pinnacle sheath was inserted. Through this, and also over a 0.035 inch guidewire a 5 [place] JB 1 catheter was advanced to the aortic arch region and selectively positioned in the left common carotid artery. FINDINGS: The left common carotid arteriogram demonstrates the left external carotid artery and its major branches to be widely patent. The left internal carotid artery at the bulb to the cranial skull base demonstrates wide patency with mild tortuosity proximally. The petrous, cavernous and supraclinoid segments are widely patent. The left middle cerebral artery demonstrates complete angiographic occlusion in the mid M1 segment at the origin of the anterior temporal branch. The left anterior cerebral artery is seen to opacify into the capillary and venous phases. The delayed arterial phase demonstrates retrograde opacification of the distal left MCA distribution from the pericallosal and callosal marginal branches. Retrograde opacification is noted into the M3 regions of the perisylvian branches. PROCEDURE:  ENDOVASCULAR REVASCULARIZATION OF OCCLUDED LEFT MIDDLE CEREBRAL ARTERY M1 SEGMENT WITH MECHANICAL THROMBECTOMY, AND ALSO BALLOON ANGIOPLASTY FOR RECURRENT OCCLUSION. The diagnostic JB 1 catheter in the left common carotid artery was exchanged over a 0.035 inch 300 cm Rosen exchange guidewire for an 8 [place] 55 cm Brite tip neurovascular sheath using biplane roadmap technique and constant fluoroscopic guidance. Good aspiration was obtained from the side port of the neurovascular sheath. This was  then connected to continuous heparinized saline infusion. Over the [employer] guidewire, an 8 [place] 85 cm balloon FlowGate guide catheter which had been prepped with 50% contrast and 50% heparinized saline infusion was advanced and positioned in the mid cervical left ICA. The guidewire was removed. Good aspiration obtained from the hub of the [facility] guide catheter. A gentle control arteriogram performed through [facility] guide catheter in the left internal carotid artery demonstrated no evidence of spasms, dissections or of intraluminal filling defects. No change was seen in the left MCA occlusion. In a coaxial manner and with constant heparinized saline infusion using biplane roadmap technique and constant fluoroscopic guidance, a combination of a 6 French 132 cm Catalyst guide catheter inside of which was an 021 Trevo ProVue microcatheter was advanced over a 0.014 inch Softip Synchro micro guidewire to the distal end of the [facility] guide catheter in the left internal carotid artery. With the micro guidewire leading with a J-tip configuration, the combination was navigated to the supraclinoid left ICA. The occluded left middle cerebral artery was then crossed with the micro guidewire without difficulty into the inferior division M2 M3 region followed by the microcatheter. The guidewire was removed. Good aspiration was obtained from the hub of the microcatheter. This was then connected to continuous heparinized saline  infusion. A 33 mm x 5 mm Embotrap retrieval device was then advanced into the distal end of the microcatheter. The proximal and the distal landing zones were then defined. The O ring on the delivery microcatheter was then loosened. With slight forward traction with the right hand on the delivery micro guidewire, with the left hand the delivery microcatheter was retrieved unsheathing the distal and then the proximal portion of the retrieval device. A gentle control arteriogram performed through Catalyst guide catheter in the supraclinoid left ICA demonstrated a revascularization. With proximal flow arrest in the left internal carotid artery by inflating the balloon of the [facility] guide catheter, the combination of the retrieval device, the microcatheter and the 6 [place] Catalyst guide catheter were retrieved and removed as aspiration was continued as the balloon was deflated in the left internal carotid artery. Aspiration was performed with a 60 mL syringe at the hub of the [facility] guide catheter, and with a Prenumbra aspiration device the hub of the 6 [place] Catalyst guide catheter. Free back bleed of blood was noted at the hub of the [facility] guide catheter. A gentle control arteriogram performed through the [facility] guide catheter in the left internal carotid artery demonstrated no change in the occluded left middle cerebral artery. No evidence of clots or debris was found in the retrieval device or the aspirate. Two more attempts at mechanical thrombectomy were then made. A second attempt was again using the combination as described above. Again after having established safe position of tip of the microcatheter and placement of the 5 mm x 33 mm Embotrap retrieval device, with proximal flow arrest in the left internal carotid, and aspiration at the hub of the [facility] guide catheter with a 60 mL syringe, and with a Penumbra aspiration catheter at the hub of the 6 [place] Catalyst guide catheter, again with  proximal flow arrest, the combination was retrieved and removed without evidence of clots in the aspirate or the retrieval device. A control arteriogram performed following this continued to demonstrate occluded left middle cerebral artery. A third attempt was made with using a combination of a 7 [place] Catalyst guide catheter inside of which was an 021 Trevo ProVue microcatheter again  advanced over a 0.014 inch Softip Synchro micro guidewire to the distal end of the supraclinoid left ICA. Again access was obtained with the micro guidewire into M2 M3 region of the dominant inferior division followed by the microcatheter. After having established safe position of the tip of the microcatheter, and connected to heparinized saline infusion, a 4 mm x 40 mm Solitaire FR retrieval device was then advanced and deployed in a predetermined position. Again a control arteriogram performed through the Catalyst guide catheter in the supraclinoid left ICA continued to demonstrate a TICI 2b revascularization while the device was opened. Thereafter with proximal flow arrest, the combination of the retrieval device, the microcatheter and the 7 [place] Catalyst guide catheter was retrieved and removed as aspiration was applied with a 60 mL syringe at the hub of the [facility] guide catheter, and with a Penumbra device at the hub of the 7 [place] Catalyst guide catheter the combination was retrieved and removed. Back bleed was allowed after reversal of aspiration. At this time there were a few flecks of whitish material noted in the aspirate. A control arteriogram performed through the [facility] guide catheter in the left internal carotid artery demonstrated continued complete occlusion of the left middle cerebral artery unchanged. It was felt that the underlying pathology was probably arteriosclerotic disease with significant stenosis. Placement of a stent across the tight occlusion of the left middle cerebral artery was felt to be  potentially riskier in terms of post reperfusion hemorrhage and also with the patient having to be given dual antiplatelets. With the patient already being on warfarin, and also a history of subdural hematoma overlying the cerebral convexity on the left side, it was felt to proceed with intracranial angioplasty. A Gateway 1.5 mm x 15 mm balloon was then prepped and purged with 50% contrast and 50% heparinized saline infusion. This was then advanced inside of an intermediary catheter to the supraclinoid left ICA over a 0.014 inch Softip Synchro micro guidewire. The micro guidewire was then advanced through the occluded left middle cerebral artery followed by the placement of the balloon. An angioplasty was then performed with a micro inflation syringe device via micro tubing inflating the balloon to just over 6 atmospheres achieving a 1.6 mm diameter where a balloon was maintained for approximately a minute and a half. It was then deflated and retrieved slightly proximally with the wire maintained distally. A control arteriogram performed demonstrated now significantly improved caliber and flow through the angioplastied segment with a TICI 2b revascularization. An angiogram performed 3 minutes later demonstrated progressive worsening of the angioplastied segment prompting a second angioplasty again with the balloon being inflated gradually to 6.1 atmospheres achieving a diameter of just over 1.6 mm where it was maintained for approximately a minute. The balloon was then deflated and retrieved proximally. A control arteriogram performed through the intermediary catheter in the left internal carotid artery demonstrated again significantly improved caliber and flow through the angioplastied segment of the left middle cerebral artery. A TICI 2b revascularization was established. A control arteriogram performed approximately 5 minutes after this again demonstrated complete occlusion of the left middle cerebral artery. At  this time it was decided to stop as placement of the stent was not an option as per discussion above. The whole head arteriogram performed demonstrated no change in the previously noted robust collaterals from the anterior cerebral artery distribution. Throughout the procedure, the patient's blood pressure and neurological status remained stable. No angiographic evidence of extravasation or mass-effect or midline  shift was noted. A CT of the head performed on the table demonstrated no gross mass effect, midline shift or of intracranial hemorrhage. Stability of the previously noted subdural hematoma was seen. The 8 [place] FlowGate guide catheter and the 8 French neurovascular sheath were then retrieved over a J-wire and replaced with an [place] sheath. This in turn was then removed with successful hemostasis with the an 8 [place] Angio-Seal device. Distal pulses remained Dopplerable in the dorsalis pedis, and posterior regions bilaterally. The patient's general anesthesia was then reversed and the patient was extubated. Upon extubation the patient was able to maintain adequate oxygenation. She was able to move to some degree her left-side spontaneously. No significant motion was noted in the right upper or right lower extremity. Her pupils were 2 mm and equal. She was then transferred to the neuro ICU for further workup. IMPRESSION: Status post endovascular revascularization of occluded left middle cerebral artery with 2 passes with the 5 mm x 33 mm Embotrap retrieval device, and 1 pass with the 4 mm x 40 mm retrieval device, achieving a TICI 2b revascularization with subsequent reocclusion. Balloon angioplasty of occluded left middle cerebral artery probably due to underlying severe stenosis due to atherosclerosis with 2 passes again achieving a TICI 2b revascularization with subsequent reocclusion. PLAN: Follow-up outpatient clinic 4 weeks post discharge. Electronically Signed   By: Luanne Bras M.D.    On: 01/06/2019 13:41   Ir Percutaneous Art Thrombectomy/infusion Intracranial Inc Diag Angio  Result Date: 01/08/2019 INDICATION: New onset dysphagia, right-sided weakness and left gaze deviation. 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 EXAM: 1. EMERGENT LARGE VESSEL OCCLUSION THROMBOLYSIS (anterior CIRCULATION) 2. Intracranial balloon angioplasty of the left middle cerebral artery. COMPARISON:  CT angiogram of the head and neck of 01/05/2019. MEDICATIONS: Vancomycin 1 g IV antibiotic was administered within 1 hour of the procedure. ANESTHESIA/SEDATION: General anesthesia. CONTRAST:  Isovue 300 approximately 120 cc. FLUOROSCOPY TIME:  Fluoroscopy Time: 83 minutes 12 seconds (3019 mGy). COMPLICATIONS: None immediate. TECHNIQUE: Following a full explanation of the procedure along with the potential associated complications, an informed witnessed consent was obtained from the patient's daughter and husband. The risks of intracranial hemorrhage of 10%, worsening neurological deficit, ventilator dependency, death and inability to revascularize were all reviewed in detail with the patient's daughter and husband. The patient was then put under general anesthesia by the Department of Anesthesiology at [facility]. The right groin was prepped and draped in the usual sterile fashion. Thereafter using modified Seldinger technique, transfemoral access into the right common femoral artery was obtained without difficulty. Over a 0.035 inch guidewire a 5 French Pinnacle sheath was inserted. Through this, and also over a 0.035 inch guidewire a 5 [place] JB 1 catheter was advanced to the aortic arch region and selectively positioned in the left common carotid artery. FINDINGS: The left common carotid arteriogram demonstrates the left external carotid artery and its major branches to be widely patent. The left internal carotid artery at the bulb to the cranial skull base demonstrates wide patency  with mild tortuosity proximally. The petrous, cavernous and supraclinoid segments are widely patent. The left middle cerebral artery demonstrates complete angiographic occlusion in the mid M1 segment at the origin of the anterior temporal branch. The left anterior cerebral artery is seen to opacify into the capillary and venous phases. The delayed arterial phase demonstrates retrograde opacification of the distal left MCA distribution from the pericallosal and callosal marginal branches. Retrograde opacification  is noted into the M3 regions of the perisylvian branches. PROCEDURE: ENDOVASCULAR REVASCULARIZATION OF OCCLUDED LEFT MIDDLE CEREBRAL ARTERY M1 SEGMENT WITH MECHANICAL THROMBECTOMY, AND ALSO BALLOON ANGIOPLASTY FOR RECURRENT OCCLUSION. The diagnostic JB 1 catheter in the left common carotid artery was exchanged over a 0.035 inch 300 cm Rosen exchange guidewire for an 8 French 55 cm Brite tip neurovascular sheath using biplane roadmap technique and constant fluoroscopic guidance. Good aspiration was obtained from the side port of the neurovascular sheath. This was then connected to continuous heparinized saline infusion. Over the [employer] guidewire, an 8 [place] 85 cm balloon FlowGate guide catheter which had been prepped with 50% contrast and 50% heparinized saline infusion was advanced and positioned in the mid cervical left ICA. The guidewire was removed. Good aspiration obtained from the hub of the [facility] guide catheter. A gentle control arteriogram performed through [facility] guide catheter in the left internal carotid artery demonstrated no evidence of spasms, dissections or of intraluminal filling defects. No change was seen in the left MCA occlusion. In a coaxial manner and with constant heparinized saline infusion using biplane roadmap technique and constant fluoroscopic guidance, a combination of a 6 French 132 cm Catalyst guide catheter inside of which was an 021 Trevo ProVue microcatheter was  advanced over a 0.014 inch Softip Synchro micro guidewire to the distal end of the [facility] guide catheter in the left internal carotid artery. With the micro guidewire leading with a J-tip configuration, the combination was navigated to the supraclinoid left ICA. The occluded left middle cerebral artery was then crossed with the micro guidewire without difficulty into the inferior division M2 M3 region followed by the microcatheter. The guidewire was removed. Good aspiration was obtained from the hub of the microcatheter. This was then connected to continuous heparinized saline infusion. A 33 mm x 5 mm Embotrap retrieval device was then advanced into the distal end of the microcatheter. The proximal and the distal landing zones were then defined. The O ring on the delivery microcatheter was then loosened. With slight forward traction with the right hand on the delivery micro guidewire, with the left hand the delivery microcatheter was retrieved unsheathing the distal and then the proximal portion of the retrieval device. A gentle control arteriogram performed through Catalyst guide catheter in the supraclinoid left ICA demonstrated a revascularization. With proximal flow arrest in the left internal carotid artery by inflating the balloon of the [facility] guide catheter, the combination of the retrieval device, the microcatheter and the 6 [place] Catalyst guide catheter were retrieved and removed as aspiration was continued as the balloon was deflated in the left internal carotid artery. Aspiration was performed with a 60 mL syringe at the hub of the [facility] guide catheter, and with a Prenumbra aspiration device the hub of the 6 [place] Catalyst guide catheter. Free back bleed of blood was noted at the hub of the [facility] guide catheter. A gentle control arteriogram performed through the [facility] guide catheter in the left internal carotid artery demonstrated no change in the occluded left middle cerebral artery. No  evidence of clots or debris was found in the retrieval device or the aspirate. Two more attempts at mechanical thrombectomy were then made. A second attempt was again using the combination as described above. Again after having established safe position of tip of the microcatheter and placement of the 5 mm x 33 mm Embotrap retrieval device, with proximal flow arrest in the left internal carotid, and aspiration at the hub of the [facility]  guide catheter with a 60 mL syringe, and with a Penumbra aspiration catheter at the hub of the 6 [place] Catalyst guide catheter, again with proximal flow arrest, the combination was retrieved and removed without evidence of clots in the aspirate or the retrieval device. A control arteriogram performed following this continued to demonstrate occluded left middle cerebral artery. A third attempt was made with using a combination of a 7 [place] Catalyst guide catheter inside of which was an 021 Trevo ProVue microcatheter again advanced over a 0.014 inch Softip Synchro micro guidewire to the distal end of the supraclinoid left ICA. Again access was obtained with the micro guidewire into M2 M3 region of the dominant inferior division followed by the microcatheter. After having established safe position of the tip of the microcatheter, and connected to heparinized saline infusion, a 4 mm x 40 mm Solitaire FR retrieval device was then advanced and deployed in a predetermined position. Again a control arteriogram performed through the Catalyst guide catheter in the supraclinoid left ICA continued to demonstrate a TICI 2b revascularization while the device was opened. Thereafter with proximal flow arrest, the combination of the retrieval device, the microcatheter and the 7 [place] Catalyst guide catheter was retrieved and removed as aspiration was applied with a 60 mL syringe at the hub of the [facility] guide catheter, and with a Penumbra device at the hub of the 7 [place] Catalyst guide catheter  the combination was retrieved and removed. Back bleed was allowed after reversal of aspiration. At this time there were a few flecks of whitish material noted in the aspirate. A control arteriogram performed through the [facility] guide catheter in the left internal carotid artery demonstrated continued complete occlusion of the left middle cerebral artery unchanged. It was felt that the underlying pathology was probably arteriosclerotic disease with significant stenosis. Placement of a stent across the tight occlusion of the left middle cerebral artery was felt to be potentially riskier in terms of post reperfusion hemorrhage and also with the patient having to be given dual antiplatelets. With the patient already being on warfarin, and also a history of subdural hematoma overlying the cerebral convexity on the left side, it was felt to proceed with intracranial angioplasty. A Gateway 1.5 mm x 15 mm balloon was then prepped and purged with 50% contrast and 50% heparinized saline infusion. This was then advanced inside of an intermediary catheter to the supraclinoid left ICA over a 0.014 inch Softip Synchro micro guidewire. The micro guidewire was then advanced through the occluded left middle cerebral artery followed by the placement of the balloon. An angioplasty was then performed with a micro inflation syringe device via micro tubing inflating the balloon to just over 6 atmospheres achieving a 1.6 mm diameter where a balloon was maintained for approximately a minute and a half. It was then deflated and retrieved slightly proximally with the wire maintained distally. A control arteriogram performed demonstrated now significantly improved caliber and flow through the angioplastied segment with a TICI 2b revascularization. An angiogram performed 3 minutes later demonstrated progressive worsening of the angioplastied segment prompting a second angioplasty again with the balloon being inflated gradually to 6.1  atmospheres achieving a diameter of just over 1.6 mm where it was maintained for approximately a minute. The balloon was then deflated and retrieved proximally. A control arteriogram performed through the intermediary catheter in the left internal carotid artery demonstrated again significantly improved caliber and flow through the angioplastied segment of the left middle cerebral artery. A TICI  2b revascularization was established. A control arteriogram performed approximately 5 minutes after this again demonstrated complete occlusion of the left middle cerebral artery. At this time it was decided to stop as placement of the stent was not an option as per discussion above. The whole head arteriogram performed demonstrated no change in the previously noted robust collaterals from the anterior cerebral artery distribution. Throughout the procedure, the patient's blood pressure and neurological status remained stable. No angiographic evidence of extravasation or mass-effect or midline shift was noted. A CT of the head performed on the table demonstrated no gross mass effect, midline shift or of intracranial hemorrhage. Stability of the previously noted subdural hematoma was seen. The 8 [place] FlowGate guide catheter and the 8 French neurovascular sheath were then retrieved over a J-wire and replaced with an [place] sheath. This in turn was then removed with successful hemostasis with the an 8 [place] Angio-Seal device. Distal pulses remained Dopplerable in the dorsalis pedis, and posterior regions bilaterally. The patient's general anesthesia was then reversed and the patient was extubated. Upon extubation the patient was able to maintain adequate oxygenation. She was able to move to some degree her left-side spontaneously. No significant motion was noted in the right upper or right lower extremity. Her pupils were 2 mm and equal. She was then transferred to the neuro ICU for further workup. IMPRESSION:  Status post endovascular revascularization of occluded left middle cerebral artery with 2 passes with the 5 mm x 33 mm Embotrap retrieval device, and 1 pass with the 4 mm x 40 mm retrieval device, achieving a TICI 2b revascularization with subsequent reocclusion. Balloon angioplasty of occluded left middle cerebral artery probably due to underlying severe stenosis due to atherosclerosis with 2 passes again achieving a TICI 2b revascularization with subsequent reocclusion. PLAN: Follow-up outpatient clinic 4 weeks post discharge. Electronically Signed   By: Luanne Bras M.D.   On: 01/06/2019 13:41    Labs:  CBC: Recent Labs    01/05/19 0559 01/06/19 0510 01/07/19 0454 01/08/19 0639  WBC 8.6 13.3* 11.4* 8.7  HGB 10.1* 8.0* 8.3* 8.0*  HCT 32.6* 25.7* 26.8* 25.6*  PLT 194 233 224 228    COAGS: Recent Labs    09/02/18 0930 10/31/18 0926 01/05/19 0559  INR 2.42 2.86 1.59  APTT  --   --  29    BMP: Recent Labs    01/05/19 0559 01/06/19 0510 01/07/19 0454 01/08/19 0639  NA 140 140 141 143  K 3.6 3.6 3.7 2.9*  CL 104 111 112* 114*  CO2 22 18* 16* 18*  GLUCOSE 112* 130* 177* 155*  BUN 8 20 20 16   CALCIUM 8.9 8.4* 8.3* 8.4*  CREATININE 1.01* 1.09* 1.01* 0.96  GFRNONAA 53* 48* 53* 56*  GFRAA >60 56* >60 >60    LIVER FUNCTION TESTS: Recent Labs    01/05/19 0559  BILITOT 1.4*  AST 47*  ALT 175*  ALKPHOS 51  PROT 6.0*  ALBUMIN 3.2*    Assessment and Plan:  Left MCA M1 segment occlusion s/p emergent mechanical thrombectomy and balloon angioplasty x2 achieving a TICI 2 revascularization with reocclusion 01/05/2019 by Dr. Estanislado Pandy. Patient's condition stable- demonstrates global aphasia, left gaze preference, can move left side but minimal movements of right side. Right groin and left groin incisions stable. Appreciate and agree with neurology and CCM management. Please call IR with questions/concerns.   Electronically Signed: Earley Abide, PA-C 01/08/2019,  11:18 AM   I spent a total of 25  Minutes at the the patient's bedside AND on the patient's hospital floor or unit, greater than 50% of which was counseling/coordinating care for left MCA M1 segment occlusion s/p revascularization.

## 2019-01-08 NOTE — Evaluation (Signed)
Occupational Therapy Evaluation Patient Details Name: Stacy [name] MRN: [id number] DOB: Nov 27, 1936 Today's Date: 01/08/2019    History of Present Illness Pt is an 83 y/o female admitted after fall. Found to have R facial droop and aphasia as well. Imaging revealed L MCA infarct, and L convexity subdural hematoma. Pt is s/p thrombectomy X3 and balloon angioplasty and had subsequent reocclusion. PMH includes DM, breast cancer, a fib.    Clinical Impression   Pt admitted with above. She demonstrates the below listed deficits and will benefit from continued OT to maximize safety and independence with BADLs.  Pt presents to OT with Rt hemiplegia, Lt gaze preference (will look ~75% of way to Rt with cues), as well as communication deficits and impaired balance.  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  She currently requires total A for all aspects of ADLs, and max A +2 for functional mobility.  PTA, she lived with spouse and was independent, per granddaughter.  Recommend CIR to reduce burden of care.       Follow Up Recommendations  CIR;Supervision/Assistance - 24 hour    Equipment Recommendations  None recommended by OT    Recommendations for Other Services Rehab consult     Precautions / Restrictions Precautions Precautions: Fall Restrictions Weight Bearing Restrictions: No      Mobility Bed Mobility Overal bed mobility: Needs Assistance Bed Mobility: Supine to Sit;Sit to Supine     Supine to sit: Max assist;+2 for physical assistance Sit to supine: +2 for physical assistance;Max assist   General bed mobility comments: Max +2 for all bed mobility. Pt requiring multimodal cues for sequencing during bed mobility tasks.   Transfers Overall transfer level: Needs assistance Equipment used: 2 person hand held assist Transfers: Sit to/from Stand Sit to Stand: Max assist;+2 physical assistance         General transfer comment: Max A +2 to stand. Required  manual cues at trunk and ischium on the R.     Balance Overall balance assessment: Needs assistance Sitting-balance support: Single extremity supported;Feet supported Sitting balance-Leahy Scale: Poor Sitting balance - Comments: Pt pushing to the R when LUE on the bed. Required mod to max A for sitting balance assist. When not pushing, pt with brief period where she only required min A for sitting balance.    Standing balance support: Single extremity supported Standing balance-Leahy Scale: Poor                             ADL either performed or assessed with clinical judgement   ADL Overall ADL's : Needs assistance/impaired Eating/Feeding: NPO   Grooming: Wash/dry hands;Wash/dry face;Oral care;Brushing hair;Total assistance;Sitting Grooming Details (indicate cue type and reason): pt resists attempts to move objects toward her face with hand over hand assist - appears apraxic Upper Body Bathing: Total assistance   Lower Body Bathing: Total assistance;Sit to/from stand   Upper Body Dressing : Total assistance;Sitting   Lower Body Dressing: Sit to/from stand;Total assistance   Toilet Transfer: Total assistance Toilet Transfer Details (indicate cue type and reason): unable to safely attempt  Toileting- Clothing Manipulation and Hygiene: Total assistance;Bed level       Functional mobility during ADLs: Maximal assistance;+2 for physical assistance;+2 for safety/equipment       Vision Baseline Vision/History: Wears glasses Wears Glasses: At all times Additional Comments: Pt with Lt gaze preference.  She will look ~ 75% of way to the Rt with cues/stimuli  Perception Perception Perception Tested?: Yes Perception Deficits: Inattention/neglect Inattention/Neglect: Does not attend to right visual field;Does not attend to right side of body   Praxis Praxis Praxis tested?: Deficits Deficits: Initiation;Ideation;Ideomotor    Pertinent Vitals/Pain Pain  Assessment: Faces Faces Pain Scale: No hurt     Hand Dominance Right   Extremity/Trunk Assessment Upper Extremity Assessment Upper Extremity Assessment: RUE deficits/detail RUE Deficits / Details: no active movement Rt UE - appears flaccid.  PROM WFL  RUE Coordination: decreased fine motor;decreased gross motor   Lower Extremity Assessment Lower Extremity Assessment: Defer to PT evaluation RLE Deficits / Details: Trace activation of R glutes when standing. Otherwise 0/5 throughout RLE. Passive Ankle DF to neutral. Otherwise passive ROM WFL in RLE.  RLE Coordination: decreased gross motor;decreased fine motor   Cervical / Trunk Assessment Cervical / Trunk Assessment: Other exceptions Cervical / Trunk Exceptions: R lateral lean. Weak trunk musculature on R secondary to CVA   Communication Communication Communication: Expressive difficulties;Receptive difficulties   Cognition Arousal/Alertness: Awake/alert Behavior During Therapy: Flat affect Overall Cognitive Status: Difficult to assess                                 General Comments: Difficult to assess secondary to global aphasia   General Comments  Pt with severe R sided neglect. Attempted to have pt track therapist to the R, pt able to track past midline approximately 3/4 of the way with max cuing. Pt resistive when attempting to turn head to the R using pillow. Educated pt's husband and grandaughter about sitting on pt's R side when visiting, to attempt to get pt to attend to the R.     Exercises     Shoulder Instructions      Home Living Family/patient expects to be discharged to:: Private residence Living Arrangements: Spouse/significant other Available Help at Discharge: Family Type of Home: House Home Access: Stairs to enter Technical brewer of Steps: 1 Entrance Stairs-Rails: Right;Left Home Layout: One level     Bathroom Shower/Tub: Teacher, early years/pre: Standard     Home  Equipment: Environmental consultant - 2 wheels   Additional Comments: Pt lives with spouse, who has h/o stroke   Lives With: Spouse    Prior Functioning/Environment Level of Independence: Independent with assistive device(s)        Comments: Per husband, pt was using RW since she had pacemaker placed.         OT Problem List: Decreased strength;Decreased range of motion;Decreased activity tolerance;Impaired balance (sitting and/or standing);Impaired vision/perception;Decreased coordination;Decreased cognition;Decreased safety awareness;Decreased knowledge of use of DME or AE;Impaired tone;Impaired UE functional use      OT Treatment/Interventions: Self-care/ADL training;Neuromuscular education;DME and/or AE instruction;Manual therapy;Splinting;Therapeutic activities;Cognitive remediation/compensation;Visual/perceptual remediation/compensation;Patient/family education;Balance training    OT Goals(Current goals can be found in the care plan section) Acute Rehab OT Goals Patient Stated Goal: for pt to regain independence per family  OT Goal Formulation: With family Time For Goal Achievement: 01/22/19 Potential to Achieve Goals: Good ADL Goals Pt Will Perform Grooming: with mod assist;sitting Pt Will Perform Upper Body Bathing: with max assist;sitting Pt Will Transfer to Toilet: with mod assist;bedside commode;squat pivot transfer Additional ADL Goal #1: Pt will locate ADL items on Rt with min cues  OT Frequency: Min 2X/week   Barriers to D/C: Decreased caregiver support  unsure if spouse able to provide necessary level of assist        Co-evaluation PT/OT/SLP  Co-Evaluation/Treatment: Yes Reason for Co-Treatment: Complexity of the patient's impairments (multi-system involvement);Necessary to address cognition/behavior during functional activity;To address functional/ADL transfers PT goals addressed during session: Mobility/safety with mobility;Balance OT goals addressed during session: ADL's and  self-care      AM-PAC OT "6 Clicks" Daily Activity     Outcome Measure Help from another person eating meals?: Total Help from another person taking care of personal grooming?: Total Help from another person toileting, which includes using toliet, bedpan, or urinal?: Total Help from another person bathing (including washing, rinsing, drying)?: Total Help from another person to put on and taking off regular upper body clothing?: Total Help from another person to put on and taking off regular lower body clothing?: Total 6 Click Score: 6   End of Session Equipment Utilized During Treatment: Gait belt Nurse Communication: Mobility status  Activity Tolerance: Patient tolerated treatment well Patient left: in bed;with call bell/phone within reach;with bed alarm set;with family/visitor present  OT Visit Diagnosis: Hemiplegia and hemiparesis;Cognitive communication deficit (R41.841) Symptoms and signs involving cognitive functions: Cerebral infarction Hemiplegia - Right/Left: Right Hemiplegia - dominant/non-dominant: Dominant Hemiplegia - caused by: Cerebral infarction                Time: 1205-1232 OT Time Calculation (min): 27 min Charges:  OT General Charges $OT Visit: 1 Visit OT Evaluation $OT Eval Moderate Complexity: 1 Mod  Lucille Passy, OTR/L Acute Rehabilitation Services Pager [phone] Office [phone]   Lucille Passy M 01/08/2019, 4:52 PM

## 2019-01-08 NOTE — Evaluation (Signed)
Physical Therapy Evaluation Patient Details Name: Stacy [name] MRN: [id number] DOB: Apr 25, 1936 Today's Date: 01/08/2019   History of Present Illness  Pt is an 83 y/o female admitted after fall. Found to have R facial droop and aphasia as well. Imaging revealed L MCA infarct, and L convexity subdural hematoma. Pt is s/p thrombectomy X3 and balloon angioplasty and had subsequent reocclusion. PMH includes DM, breast cancer, a fib.   Clinical Impression  Pt admitted secondary to problem above with deficits below. Pt presenting with R sided weakness, global aphasia, apraxia, and R sided neglect. Pt requiring max A +2 for bed mobility tasks and to stand at EOB. Practiced sitting balance and pt able to maintain brief period of min A, however, tended to push to the R and required mod to max A to maintain sitting balance when pushing. Pt from home with her husband and was previously mod I with RW. Feel pt is appropriate for CIR level therapies to address current deficits. Will continue to follow acutely to maximize functional mobility independence and safety.     Follow Up Recommendations CIR;Supervision/Assistance - 24 hour    Equipment Recommendations  None recommended by PT    Recommendations for Other Services Rehab consult     Precautions / Restrictions Precautions Precautions: Fall Restrictions Weight Bearing Restrictions: No      Mobility  Bed Mobility Overal bed mobility: Needs Assistance Bed Mobility: Supine to Sit;Sit to Supine     Supine to sit: Max assist;+2 for physical assistance Sit to supine: +2 for physical assistance;Max assist   General bed mobility comments: Max +2 for all bed mobility. Pt requiring multimodal cues for sequencing during bed mobility tasks.   Transfers Overall transfer level: Needs assistance Equipment used: 2 person hand held assist Transfers: Sit to/from Stand Sit to Stand: Max assist;+2 physical assistance         General transfer  comment: Max A +2 to stand. Required manual cues at trunk and ischium on the R.   Ambulation/Gait                Stairs            Wheelchair Mobility    Modified Rankin (Stroke Patients Only)       Balance Overall balance assessment: Needs assistance Sitting-balance support: Single extremity supported;Feet supported Sitting balance-Leahy Scale: Poor Sitting balance - Comments: Pt pushing to the R when LUE on the bed. Required mod to max A for sitting balance assist. When not pushing, pt with brief period where she only required min A for sitting balance.    Standing balance support: Single extremity supported Standing balance-Leahy Scale: Poor                               Pertinent Vitals/Pain Pain Assessment: Faces Faces Pain Scale: No hurt    Home Living Family/patient expects to be discharged to:: Private residence Living Arrangements: Spouse/significant other Available Help at Discharge: Family Type of Home: House Home Access: Stairs to enter Entrance Stairs-Rails: Psychiatric nurse of Steps: 1 Home Layout: One level Home Equipment: Walker - 2 wheels      Prior Function Level of Independence: Independent with assistive device(s)         Comments: Per husband, pt was using RW since she had pacemaker placed.      Hand Dominance        Extremity/Trunk Assessment   Upper Extremity Assessment  Upper Extremity Assessment: Defer to OT evaluation    Lower Extremity Assessment Lower Extremity Assessment: RLE deficits/detail;Difficult to assess due to impaired cognition RLE Deficits / Details: Trace activation of R glutes when standing. Otherwise 0/5 throughout RLE. Passive Ankle DF to neutral. Otherwise passive ROM WFL in RLE.  RLE Coordination: decreased gross motor;decreased fine motor    Cervical / Trunk Assessment Cervical / Trunk Assessment: Other exceptions Cervical / Trunk Exceptions: R lateral lean. Weak  trunk musculature on R secondary to CVA  Communication   Communication: Expressive difficulties;Receptive difficulties  Cognition Arousal/Alertness: Awake/alert Behavior During Therapy: Flat affect Overall Cognitive Status: Difficult to assess                                 General Comments: Difficult to assess secondary to global aphasia      General Comments General comments (skin integrity, edema, etc.): Pt with severe R sided neglect. Attempted to have pt track therapist to the R, pt able to track past midline approximately 3/4 of the way with max cuing. Pt resistive when attempting to turn head to the R using pillow. Educated pt's husband and grandaughter about sitting on pt's R side when visiting, to attempt to get pt to attend to the R.     Exercises     Assessment/Plan    PT Assessment Patient needs continued PT services  PT Problem List Decreased strength;Decreased activity tolerance;Decreased balance;Decreased mobility;Decreased cognition;Decreased knowledge of use of DME;Decreased safety awareness;Decreased coordination;Decreased knowledge of precautions       PT Treatment Interventions DME instruction;Gait training;Functional mobility training;Therapeutic activities;Therapeutic exercise;Balance training;Patient/family education;Neuromuscular re-education;Cognitive remediation    PT Goals (Current goals can be found in the Care Plan section)  Acute Rehab PT Goals Patient Stated Goal: for pt to regain independence per family  PT Goal Formulation: With family Time For Goal Achievement: 01/22/19 Potential to Achieve Goals: Good    Frequency Min 4X/week   Barriers to discharge        Co-evaluation PT/OT/SLP Co-Evaluation/Treatment: Yes Reason for Co-Treatment: To address functional/ADL transfers;Complexity of the patient's impairments (multi-system involvement);For patient/therapist safety PT goals addressed during session: Mobility/safety with  mobility;Balance         AM-PAC PT "6 Clicks" Mobility  Outcome Measure Help needed turning from your back to your side while in a flat bed without using bedrails?: A Lot Help needed moving from lying on your back to sitting on the side of a flat bed without using bedrails?: A Lot Help needed moving to and from a bed to a chair (including a wheelchair)?: A Lot Help needed standing up from a chair using your arms (e.g., wheelchair or bedside chair)?: A Lot Help needed to walk in hospital room?: Total Help needed climbing 3-5 steps with a railing? : Total 6 Click Score: 10    End of Session   Activity Tolerance: Patient tolerated treatment well Patient left: in bed;with call bell/phone within reach;with bed alarm set;with family/visitor present Nurse Communication: Mobility status;Other (comment)(PRAFO for RLE ) PT Visit Diagnosis: Muscle weakness (generalized) (M62.81);Difficulty in walking, not elsewhere classified (R26.2);Hemiplegia and hemiparesis Hemiplegia - Right/Left: Right Hemiplegia - caused by: Cerebral infarction    Time: 1205-1232 PT Time Calculation (min) (ACUTE ONLY): 27 min   Charges:   PT Evaluation $PT Eval Moderate Complexity: 1 Mod          Leighton Ruff, PT, DPT  Acute Rehabilitation Services  Pager: [phone])  [phone] Office: [phone]   Stacy [name] 01/08/2019, 2:11 PM

## 2019-01-08 NOTE — Progress Notes (Signed)
Rehab Admissions Coordinator Note:  Patient was screened by Cleatrice Burke for appropriateness for an Inpatient Acute Rehab Consult per PT recs. Noted Dr, Clydene Fake plan to observe swallowing function over the weekend and that if not improved, may need to consider comfort care. I will follow up on Monday. Would not recommend CIR consult at this time.     Cleatrice Burke RN MSN 01/08/2019, 2:22 PM  I can be reached at [phone].

## 2019-01-08 NOTE — Care Management Important Message (Signed)
Important Message  Patient Details  Name: Stacy [name] MRN: [id number] Date of Birth: [birth year]   Medicare Important Message Given:  Yes    [name] 01/08/2019, 11:16 AM

## 2019-01-08 NOTE — Progress Notes (Signed)
STROKE TEAM PROGRESS NOTE   SUBJECTIVE She remains globally aphasic with dense right hemiplegia but is able to look to the right past midline. Repeat EEG shows nonspecific left focal hemispheric slowing superimposed upon generalized mild encephalopathy. No definite epileptiform activity  OBJECTIVE Vitals Blood pressure (!) 147/66, pulse 69, temperature 97.7 F (36.5 C), temperature source Oral, resp. rate 15, height 5\' 5"  (1.651 m), weight 71.1 kg, SpO2 100 %.   Physical Exam  Frail elderly Caucasian lady currently not in distress.  .  . Afebrile. Head is nontraumatic. Neck is supple without bruit.    Cardiac exam no murmur or gallop. Lungs are clear to auscultation. Distal pulses are well felt. Neurological Exam :  Awake alert but globally aphasic. Not following any commands.Left gaze preference and can  look to the right past midline. Pupils 3 mm sluggishly reactive. Fundi not visualized. She has a weak cough and gag. Tongue midline. Spontaneous antigravity movements on the left side.dense right hemiplegia with only trace withdrawal to nail bed pressure in the right arm and leg. Flaccid hypotonia in the right and normal tone on the left. Right plantar upgoing left downgoing  Medications . atorvastatin  20 mg Per Tube q1800  . fluticasone  2 spray Each Nare Daily  . gabapentin  100 mg Per Tube BID  . insulin aspart  0-15 Units Subcutaneous Q4H  . iopamidol  100 mL Intravenous Once  . levothyroxine  75 mcg Per Tube Q0600  . [START ON 01/09/2019] magnesium oxide  400 mg Per Tube Daily  . mouth rinse  15 mL Mouth Rinse BID  . metoprolol tartrate  25 mg Per Tube BID  . pantoprazole sodium  40 mg Per Tube Daily  . potassium chloride  10 mEq Oral Daily     Pertinent Laboratory Studies/Diagnostics Recent Labs    01/06/19 0510 01/07/19 0454 01/08/19 0639  WBC 13.3* 11.4* 8.7  HGB 8.0* 8.3* 8.0*  PLT 233 224 228  NA 140 141 143  K 3.6 3.7 2.9*  CREATININE 1.09* 1.01* 0.96  GLUCOSE  130* 177* 155*    No results found.   ASSESSMENT [name] is a 83 y.o. female with  With history of diabetes, hyperlipidemia, hypothyroidism, breast cancer, paroxysmal atrial fibrillation on warfarin who presented with left MCA infarct due to left M1 occlusion status post failed mechanical thrombectomy and balloon angioplasty without stent deployment due to subacute subdural hematoma and fear of increase bleeding risk.  IMPRESSION She has clinically behaved like a large left MCA infarct but interestingly CT scan 3 has not shown any corresponding large left MCA infarct despite left MCA occlusion. EEG 2 has not shown definite irritability or seizure activity   RECOMMENDATIONS  Patient has failed swallow eval. Recommend panda tube insertion and dietitian consult for tube feeds. Start aspirin for stroke prevention. If the patient's swallowing function does not improve over the weekend family may need to consider comfort care. Discussed with patient's granddaughter and husband and Dr Broadus John at the bedside and answered questions. Greater than 50% time during this 25 minute visit was spent on counseling and coordination of care about left MCA occlusion and infarct and discussion about dysphagia and prognosis   Hospital day # [place], MD [place] for Schedule & Pager information 01/08/2019 12:56 PM    To contact Stroke Continuity provider, please refer to [email or web address] After hours, contact General Neurology

## 2019-01-08 NOTE — Progress Notes (Signed)
Initial Nutrition Assessment  DOCUMENTATION CODES:   Not applicable  INTERVENTION:   -Initiate Jevity 1.2 @ 20 ml/hr via cortrak tube and increase by 10 ml every 4 hours to goal rate of 60 ml/hr.   Tube feeding regimen provides 1728 kcal (100% of needs), 80 grams of protein, and 1162 ml of H2O.   NUTRITION DIAGNOSIS:   Inadequate oral intake related to dysphagia, inability to eat as evidenced by NPO status.  GOAL:   Patient will meet greater than or equal to 90% of their needs  MONITOR:   Diet advancement, Labs, Weight trends, Skin, TF tolerance, I & O's  REASON FOR ASSESSMENT:   Consult Enteral/tube feeding initiation and management  ASSESSMENT:   Stacy [name] is an 83 y.o. female with DM, HLD, hypothyroidism, breast cancer, paroxysmal atrial fibrillation anticoagulated on warfarin, who arrived this morning via EMS after a fall at home. She states that she woke up and walked normally to the bathroom, then fell and "everything went haywire". She was not able to provide an exact LKN, but based on the call time to EMS from home of 3:52 AM, her LKN/TOSO is best estimated as 3:50 PM. EMS reported an initial right facial droop and speech difficulty. They noted that she was not completing sentence and was having difficulty finding words. Since arrival to the ED, her speech deficit has waxed and waned. She states that she is able to perform all of her ADLs at home and that she ambulates normally, except for frequent recent falls.   Pt admitted with aphasia and rt sided weakness. CTA revealed lt M1 occlusion.   1/21- s/p Lt common carotid arteriogram followed endovascular revascularization of Lt MCA M1 occlusion 1/22- s/p sheath removal 1/23- s/p BSE- recommend NPO 1/24- s/p BSE- recommend continued NPO and cortrak; cortrak tube placed- tip of tube in stomach  Pt very sleepy at time of visit. Husband at bedside also asleep. Both pt and husband did not arouse to voice. Pt aroused  slightly to tough, but not enough to engage in conversation. Per nurse tech in room, pt had a long day today, including therapy sessions and cortrak tube placement.   Reviewed wt hx; noted pt wt has been stable over the past 4 months. Suspect pt was eating well PTA.  Last Hgb A1c: 6.9 (01/06/09). PTA DM medications are 10 units insulin glargine BID .   Labs reviewed: K: 2.9 (on supplementation), CBGS: 119-143 (inpatient orders for glycemic control are0-15 units insulin aspart q 4 hours ).   NUTRITION - FOCUSED PHYSICAL EXAM:    Most Recent Value  Orbital Region  No depletion  Upper Arm Region  No depletion  Thoracic and Lumbar Region  No depletion  Buccal Region  No depletion  Temple Region  No depletion  Clavicle Bone Region  No depletion  Clavicle and Acromion Bone Region  No depletion  Scapular Bone Region  No depletion  Dorsal Hand  No depletion  Patellar Region  No depletion  Anterior Thigh Region  No depletion  Posterior Calf Region  No depletion  Edema (RD Assessment)  None  Hair  Reviewed  Eyes  Reviewed  Mouth  Reviewed  Skin  Reviewed  Nails  Reviewed       Diet Order:   Diet Order            Diet NPO time specified  Diet effective now              EDUCATION NEEDS:  No education needs have been identified at this time  Skin:  Skin Assessment: Skin Integrity Issues: Skin Integrity Issues:: Incisions Incisions: closed rt groin  Last BM:  PTA  Height:   Ht Readings from Last 1 Encounters:  01/06/19 5\' 5"  (1.651 m)    Weight:   Wt Readings from Last 1 Encounters:  01/06/19 71.1 kg    Ideal Body Weight:  56.8 kg  BMI:  Body mass index is 26.08 kg/m.  Estimated Nutritional Needs:   Kcal:  1550-1750  Protein:  80-95 grams  Fluid:  1.5-1.7 L    Meighan Treto A. Jimmye Norman, RD, LDN, CDE Pager: [phone] After hours Pager: [phone]

## 2019-01-09 LAB — BASIC METABOLIC PANEL
Anion gap: 9 (ref 5–15)
BUN: 16 mg/dL (ref 8–23)
CO2: 22 mmol/L (ref 22–32)
Calcium: 8.7 mg/dL — ABNORMAL LOW (ref 8.9–10.3)
Chloride: 111 mmol/L (ref 98–111)
Creatinine, Ser: 0.66 mg/dL (ref 0.44–1.00)
GFR calc Af Amer: >60 mL/min (ref >60)
GFR calc non Af Amer: >60 mL/min (ref >60)
Glucose, Bld: 260 mg/dL — ABNORMAL HIGH (ref 70–99)
Potassium: 3.3 mmol/L — ABNORMAL LOW (ref 3.5–5.1)
Sodium: 142 mmol/L (ref 135–145)

## 2019-01-09 LAB — GLUCOSE, CAPILLARY
Glucose-Capillary: 210 mg/dL — ABNORMAL HIGH (ref 70–99)
Glucose-Capillary: 237 mg/dL — ABNORMAL HIGH (ref 70–99)
Glucose-Capillary: 238 mg/dL — ABNORMAL HIGH (ref 70–99)
Glucose-Capillary: 260 mg/dL — ABNORMAL HIGH (ref 70–99)
Glucose-Capillary: 288 mg/dL — ABNORMAL HIGH (ref 70–99)
Glucose-Capillary: 330 mg/dL — ABNORMAL HIGH (ref 70–99)

## 2019-01-09 LAB — CBC
HCT: 27.3 % — ABNORMAL LOW (ref 36.0–46.0)
Hemoglobin: 8.5 g/dL — ABNORMAL LOW (ref 12.0–15.0)
MCH: 30.1 pg (ref 26.0–34.0)
MCHC: 31.1 g/dL (ref 30.0–36.0)
MCV: 96.8 fL (ref 80.0–100.0)
Platelets: 242 10*3/uL (ref 150–400)
RBC: 2.82 MIL/uL — ABNORMAL LOW (ref 3.87–5.11)
RDW: 15.2 % (ref 11.5–15.5)
WBC: 8.8 10*3/uL (ref 4.0–10.5)
nRBC: 0 % (ref 0.0–0.2)

## 2019-01-09 LAB — HEPATITIS B CORE ANTIBODY, IGM: Hep B C IgM: NEGATIVE

## 2019-01-09 LAB — HEPATITIS A ANTIBODY, IGM: Hep A IgM: NEGATIVE

## 2019-01-09 LAB — HEPATITIS B SURFACE ANTIGEN: Hepatitis B Surface Ag: NEGATIVE

## 2019-01-09 LAB — HEPATITIS C ANTIBODY: HCV Ab: <0.1 s/co ratio (ref 0.0–0.9)

## 2019-01-09 MED ORDER — INSULIN GLARGINE 100 UNIT/ML ~~LOC~~ SOLN
20.0000 [IU] | Freq: Every day | SUBCUTANEOUS | Status: DC
  Administered 2019-01-09: 20 [IU] via SUBCUTANEOUS
  Filled 2019-01-09 (×2): qty 0.2

## 2019-01-09 MED ORDER — POTASSIUM CHLORIDE 20 MEQ/15ML (10%) PO SOLN
40.0000 meq | Freq: Two times a day (BID) | ORAL | Status: AC
  Administered 2019-01-09 (×2): 40 meq
  Filled 2019-01-09 (×2): qty 30

## 2019-01-09 MED ORDER — ATORVASTATIN CALCIUM 10 MG PO TABS
10.0000 mg | ORAL_TABLET | Freq: Every day | ORAL | Status: DC
  Administered 2019-01-09 – 2019-01-13 (×5): 10 mg
  Filled 2019-01-09 (×5): qty 1

## 2019-01-09 NOTE — Progress Notes (Addendum)
PROGRESS NOTE    Stacy [name]  TXM:[id number]  DOB: 07-28-1936  DOA: 01/05/2019 PCP: Rusty Aus, MD  Brief Narrative:   83 year old female with history of hypertension hyperlipidemia, Diabetes mellitus, hypothyroidism, breast cancer,A. fib on chronic anticoagulation with warfarin who had a fall 2 weeks back as well as on the day of admission presented to this [facility] on January 21 with right facial droop and aphasia.  Patient felt to have left MCA stroke due to left MI occlusion.  Seen by neurology.  She failed mechanical thrombectomy and balloon angioplasty attempt, stent not deployed due to concurrent subdural hematoma and increased risk of hemorrhage.  Neurology has been following this patient closely.  Anticoagulation has been on hold due to subdural hematoma.  Imaging studies of the head have not shown brain infarcts on CT scan x3.  Repeat EEG, negative for epileptiform activities  Subjective: -Events overnight, has core track, tolerating tube feeds -No changes in aphasia, right-sided strength etc.  Assessment & Plan:   1.  Acute left MCA stroke, due to M1 occlusion -Status post failed mechanical thrombectomy x2 and balloon angioplasty -CTA head & neck ELVO L M1 thrombus.  Mixed density SDH left cerebral convexity. Cerebral angiogram endovascular TICI2b revascularization L M1 occlusion with 2 passes embotrap, 1 pass Solitaire.  Angioplasty x2 with continued TICI2b revascularization with reocclusion.  -Repeat CT head with stable subdural hematoma -Stent could not be deployed due to concurrent subdural hematoma of moderate size and increased risk of hemorrhage if antiplatelets were needed -Unable to have an MRI due to pacemaker, stroke not noted on repeat CTs -EEG negative for epileptiform activity -Other possibility is SDH causing mass-effect, less likely per neurology given small size of subdural -With residual dense right hemiplegia, global aphasia and severe  dysphagia -2D echocardiogram with preserved EF, no cardiac source of embolus -LDL was 35, hemoglobin A1c 6.9 -discussed overall prognosis with husband and granddaughter at bedside, they agree that PEG tube would not be what she would want in such a situation, placed a core track, started tube feeds yesterday, plan to reassess in 2 to 3 days will need palliative care evaluation if dysphagia/aphasia and weakness shows no signs of improvement -remains off anticoagulation due to subdural hematoma, continue statin -Started on aspirin 81 mg p.o. tube yesterday -PT/OT eval ongoing  2.  Left subdural hematoma: In the setting of falls/chronic anticoagulation.  Maximum thickness at 10 mm with mild local mass-effect. -Coumadin discontinued  3.  Chronic atrial fibrillation:  -restart Toprol at a lower dose, anticoagulation on hold as above  4.  Diabetes mellitus type 2:  -Hemoglobin A1c at 6.9.   -CBG is elevated after starting tube feeds, add Lantus today  5.  History of left breast cancer:  6. Elevated LFTs: monitor on statins   DVT prophylaxis: SCD Code Status: DNR Family / Patient Communication: Discussed with husband bedside  yesterday Disposition Plan: To be determined  Objective: Vitals:   01/09/19 0428 01/09/19 0814 01/09/19 0959 01/09/19 1204  BP: (!) 143/60 135/61 (!) 144/55 140/60  Pulse: 70 70 70 69  Resp:  18  20  Temp: 98.6 F (37 C) 99.3 F (37.4 C)  99.5 F (37.5 C)  TempSrc: Oral Axillary  Axillary  SpO2: 100% 100%  100%  Weight:      Height:        Intake/Output Summary (Last 24 hours) at 01/09/2019 1224 Last data filed at 01/09/2019 0835 Gross per 24 hour  Intake 207 ml  Output 1450 ml  Net -1243 ml   Filed Weights   01/06/19 1400 01/09/19 0409  Weight: 71.1 kg 72.9 kg    Physical Examination:   Gen: Frail chronically ill female, alert awake, aphasic, left gaze preference HEENT: PERRLA, Neck supple, no JVD Lungs: Clear bilaterally CVS: RRR,No  Gallops,Rubs or new Murmurs Abd: soft, Non tender, non distended, BS present Extremities: No edema Skin: no new rashes Neuro: Global aphasia, dense right hemiplegia  Data Reviewed: I have personally reviewed following labs and imaging studies  CBC: Recent Labs  Lab 01/05/19 0559 01/06/19 0510 01/07/19 0454 01/08/19 0639 01/09/19 0407  WBC 8.6 13.3* 11.4* 8.7 8.8  NEUTROABS 6.8 10.0*  --   --   --   HGB 10.1* 8.0* 8.3* 8.0* 8.5*  HCT 32.6* 25.7* 26.8* 25.6* 27.3*  MCV 97.3 98.8 100.0 98.1 96.8  PLT 194 233 224 228 951   Basic Metabolic Panel: Recent Labs  Lab 01/05/19 0559 01/06/19 0510 01/07/19 0454 01/08/19 0639 01/09/19 0407  NA 140 140 141 143 142  K 3.6 3.6 3.7 2.9* 3.3*  CL 104 111 112* 114* 111  CO2 22 18* 16* 18* 22  GLUCOSE 112* 130* 177* 155* 260*  BUN 8 20 20 16 16   CREATININE 1.01* 1.09* 1.01* 0.96 0.66  CALCIUM 8.9 8.4* 8.3* 8.4* 8.7*   GFR: Estimated Creatinine Clearance: 56.1 mL/min (by C-G formula based on SCr of 0.66 mg/dL). Liver Function Tests: Recent Labs  Lab 01/05/19 0559  AST 47*  ALT 175*  ALKPHOS 51  BILITOT 1.4*  PROT 6.0*  ALBUMIN 3.2*   No results for input(s): LIPASE, AMYLASE in the last 168 hours. Recent Labs  Lab 01/07/19 0943  AMMONIA 14   Coagulation Profile: Recent Labs  Lab 01/05/19 0559  INR 1.59   Cardiac Enzymes: Recent Labs  Lab 01/05/19 0559  TROPONINI 0.03*   BNP (last 3 results) No results for input(s): PROBNP in the last 8760 hours. HbA1C: No results for input(s): HGBA1C in the last 72 hours. CBG: Recent Labs  Lab 01/08/19 2137 01/08/19 2333 01/09/19 0400 01/09/19 0812 01/09/19 1127  GLUCAP 224* 221* 210* 260* 237*   Lipid Profile: No results for input(s): CHOL, HDL, LDLCALC, TRIG, CHOLHDL, LDLDIRECT in the last 72 hours. Thyroid Function Tests: No results for input(s): TSH, T4TOTAL, FREET4, T3FREE, THYROIDAB in the last 72 hours. Anemia Panel: No results for input(s): VITAMINB12,  FOLATE, FERRITIN, TIBC, IRON, RETICCTPCT in the last 72 hours. Sepsis Labs: No results for input(s): PROCALCITON, LATICACIDVEN in the last 168 hours.  Recent Results (from the past 240 hour(s))  MRSA PCR Screening     Status: None   Collection Time: 01/05/19  1:03 PM  Result Value Ref Range Status   MRSA by PCR NEGATIVE NEGATIVE Final    Comment:        The GeneXpert MRSA Assay (FDA approved for NASAL specimens only), is one component of a comprehensive MRSA colonization surveillance program. It is not intended to diagnose MRSA infection nor to guide or monitor treatment for MRSA infections. Performed at [facility], [place] [address] [place], [place] [address]       Radiology Studies: No results found.      Scheduled Meds: . aspirin  81 mg Per Tube Daily  . atorvastatin  10 mg Per Tube q1800  . chlorhexidine  15 mL Mouth Rinse BID  . fluticasone  2 spray Each Nare Daily  . gabapentin  100 mg Per Tube BID  .  insulin aspart  0-15 Units Subcutaneous Q4H  . iopamidol  100 mL Intravenous Once  . levothyroxine  75 mcg Per Tube Q0600  . magnesium oxide  400 mg Per Tube Daily  . mouth rinse  15 mL Mouth Rinse BID  . metoprolol tartrate  25 mg Per Tube BID  . pantoprazole sodium  40 mg Per Tube Daily  . potassium chloride  40 mEq Per Tube BID   Continuous Infusions: . feeding supplement (JEVITY 1.2 CAL) 1,000 mL (01/09/19 0833)      LOS: 4 days    Time spent:     Domenic Polite, MD  01/09/2019, 12:24 PM

## 2019-01-09 NOTE — Progress Notes (Signed)
STROKE TEAM PROGRESS NOTE   SUBJECTIVE No family at the bedside.  She remains globally aphasic with dense right hemiplegia but is able to look to the right past midline.  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  OBJECTIVE Vitals Blood pressure (!) 143/60, pulse 70, temperature 98.6 F (37 C), temperature source Oral, resp. rate 20, height 5\' 5"  (1.651 m), weight 72.9 kg, SpO2 100 %.   Physical Exam Frail elderly Caucasian lady currently not in distress. Afebrile. Head is nontraumatic. Neck is supple without bruit.    Cardiac exam no murmur or gallop. Lungs are clear to auscultation. Distal pulses are well felt. Neurological Exam Awake alert but globally aphasic. Not following any commands.Left gaze preference and can  look to the right past midline. Pupils 3 mm sluggishly reactive. Fundi not visualized.  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  Significant right facial droop. Tongue midline in mouth. Spontaneous antigravity movements on the left side. dense right hemiplegia with only trace withdrawal to nail bed pressure in the right arm and leg. Flaccid hypotonia in the right and normal tone on the left. Right plantar upgoing left downgoing. Sensation, coordination and gait not tested.   Medications . aspirin  81 mg Per Tube Daily  . atorvastatin  10 mg Per Tube q1800  . chlorhexidine  15 mL Mouth Rinse BID  . fluticasone  2 spray Each Nare Daily  . gabapentin  100 mg Per Tube BID  . insulin aspart  0-15 Units Subcutaneous Q4H  . iopamidol  100 mL Intravenous Once  . levothyroxine  75 mcg Per Tube Q0600  . magnesium oxide  400 mg Per Tube Daily  . mouth rinse  15 mL Mouth Rinse BID  . metoprolol tartrate  25 mg Per Tube BID  . pantoprazole sodium  40 mg Per Tube Daily  . potassium chloride  10 mEq Oral Daily     Pertinent Laboratory Studies/Diagnostics Recent Labs    01/07/19 0454 01/08/19 0639 01/09/19 0407  WBC 11.4* 8.7 8.8  HGB 8.3* 8.0* 8.5*  PLT 224 228 242  NA  141 143 142  K 3.7 2.9* 3.3*  CREATININE 1.01* 0.96 0.66  GLUCOSE 177* 155* 260*    Ct Angio Head W Or Wo Contrast  Result Date: 01/05/2019 CLINICAL DATA:  Focal neuro deficit with stroke suspected EXAM: CT ANGIOGRAPHY HEAD AND NECK TECHNIQUE: Multidetector CT imaging of the head and neck was performed using the standard protocol during bolus administration of intravenous contrast. Multiplanar CT image reconstructions and MIPs were obtained to evaluate the vascular anatomy. Carotid stenosis measurements (when applicable) are obtained utilizing NASCET criteria, using the distal internal carotid diameter as the denominator. CONTRAST:  58mL ISOVUE-370 IOPAMIDOL (ISOVUE-370) INJECTION 76% COMPARISON:  Noncontrast head CT from earlier today FINDINGS: CTA NECK FINDINGS Aortic arch: Atherosclerosis.  Three vessel branching. Right carotid system: Mild atherosclerotic plaque. No stenosis or ulceration. Left carotid system: Mild atherosclerotic plaque. No stenosis or ulceration. Vertebral arteries: Proximal subclavian atherosclerosis without flow limiting stenosis. A tubal tortuosity. Both vertebral arteries are widely patent to the dura. Skeleton: Negative Other neck: No acute finding. Upper chest: Layering right pleural effusion. Review of the MIP images confirms the above findings CTA HEAD FINDINGS Anterior circulation: Left M1 to M2 occlusion with acute appearance causing a proximal meniscus sign. There is downstream under filled reconstitution vessels. Atherosclerotic calcification of the carotid siphons. Posterior circulation: Symmetric vertebral arteries. The vertebral and basilar arteries are smooth and widely patent. No branch occlusion  or flow limiting stenosis. Venous sinuses: Patent as permitted by contrast timing Anatomic variants: None significant. Delayed phase: No abnormal intracranial enhancement. Known mixed density subdural hematoma along the left cerebral convexity. Review of the MIP images  confirms the above findings Critical Value/emergent results were called by telephone at the time of interpretation on 01/05/2019 at 7:53 am to Dr. Delora Fuel , who verbally acknowledged these results. IMPRESSION: 1. 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 There is underfilling of the reconstituted downstream vessels. 2. Mixed density subdural hematoma along the left cerebral convexity, see preceding noncontrast head CT. 3. Limited atherosclerosis for age. No flow limiting stenosis or ulceration. Electronically Signed   By: Monte Fantasia M.D.   On: 01/05/2019 07:59   Dg Chest 2 View  Result Date: 01/05/2019 CLINICAL DATA:  Initial evaluation for acute trauma, fall. EXAM: CHEST - 2 VIEW COMPARISON:  Prior radiograph from 09/02/2018 FINDINGS: There has been interval placement of a triple lead transvenous pacemaker/AICD. Cardiomegaly is relatively stable. Mediastinal silhouette within normal limits. Aortic atherosclerosis. Lungs hypoinflated. No focal infiltrates. No pulmonary edema. Small layering right pleural effusion seen posteriorly on lateral projection. No pneumothorax. No acute osseous finding. Multiple surgical clips overlie the left axilla. IMPRESSION: 1. Small layering right pleural effusion. 2. No other active cardiopulmonary disease. Electronically Signed   By: Jeannine Boga M.D.   On: 01/05/2019 06:15   Dg Clavicle Left  Result Date: 01/05/2019 CLINICAL DATA:  Initial evaluation for acute left clavicular pain status post fall. EXAM: LEFT CLAVICLE - 2+ VIEWS COMPARISON:  None. FINDINGS: No acute fracture or dislocation. Osteoarthritic changes present at the left [facility] joint. Pacemaker electrodes partially overlie the clavicle. Visualized left lung apex clear. No soft tissue abnormality. IMPRESSION: No acute osseous abnormality about the left clavicle. Electronically Signed   By: Jeannine Boga M.D.   On: 01/05/2019 06:17   Ct Head Wo Contrast  Addendum Date: 01/07/2019    ADDENDUM REPORT: 01/07/2019 04:46 ADDENDUM: Correction: FINDINGS: Similar to decreased 8 mm LEFT (not RIGHT) holo hemispheric mixed density subdural hematoma. Electronically Signed   By: Elon Alas M.D.   On: 01/07/2019 04:46   Result Date: 01/07/2019 CLINICAL DATA:  Follow up stroke. Status post endovascular revascularization of LEFT MCA occlusion. EXAM: CT HEAD WITHOUT CONTRAST TECHNIQUE: Contiguous axial images were obtained from the base of the skull through the vertex without intravenous contrast. COMPARISON:  None. FINDINGS: BRAIN: No intraparenchymal hemorrhage, mass effect nor midline shift. The ventricles and sulci are normal for age. Patchy supratentorial white matter hypodensities within normal range for patient's age, though non-specific are most compatible with chronic small vessel ischemic disease. No acute large vascular territory infarcts. Similar to decreased 8 mm RIGHT holo hemispheric mixed density subdural hematoma. VASCULAR: Moderate calcific atherosclerosis of the carotid siphons. Asymmetrically dense LEFT MCA best appreciated on sagittal series. The SKULL: No skull fracture. No significant scalp soft tissue swelling. SINUSES/ORBITS: Paranasal sinuses are well aerated. Trace bilateral mastoid effusions. Included ocular globes and orbital contents are non-suspicious. Status post bilateral ocular lens implants. OTHER: None. IMPRESSION: 1. Dense LEFT MCA: Recurrent occlusion possible versus enhancing residual thrombus. 2. No acute infarct. 3. Similar to decreased mixed density LEFT holo hemispheric subdural hematoma. 4. These results will be called to the ordering clinician or representative by the professional radiologist assistant, and communication documented in [employer] Dashboard. Electronically Signed: By: Elon Alas M.D. On: 01/06/2019 05:14   Ct Head Wo Contrast  Result Date: 01/07/2019 CLINICAL DATA:  Follow  up stroke. Status post endovascular revascularization of LEFT  MCA occlusion history of breast cancer. EXAM: CT HEAD WITHOUT CONTRAST TECHNIQUE: Contiguous axial images were obtained from the base of the skull through the vertex without intravenous contrast. COMPARISON:  CT HEAD January 06, 2018. FINDINGS: BRAIN: No intraparenchymal hemorrhage, mass effect nor midline shift. The ventricles and sulci are normal for age. Patchy supratentorial white matter hypodensities compatible with chronic small vessel ischemic changes. No acute large vascular territory infarcts. Stable 8 mm mixed density LEFT holo hemispheric subdural hematoma. Basal cisterns are patent. VASCULAR: Moderate calcific atherosclerosis of the carotid siphons. SKULL: No skull fracture. No significant scalp soft tissue swelling. SINUSES/ORBITS: Trace paranasal sinus mucosal thickening. Minimal mastoid effusions. Included ocular globes and orbital contents are non-suspicious. Status post bilateral ocular lens implants. OTHER: None. IMPRESSION: 1. No acute intracranial process. 2. Stable 8 mm mixed density LEFT holo hemispheric subdural hematoma. Electronically Signed   By: Elon Alas M.D.   On: 01/07/2019 04:45   Ct Head Wo Contrast  Result Date: 01/05/2019 CLINICAL DATA:  83 year old female fell off toilet tonight. Garbled speech and mild facial droop. Initial encounter. EXAM: CT HEAD WITHOUT CONTRAST CT CERVICAL SPINE WITHOUT CONTRAST TECHNIQUE: Multidetector CT imaging of the head and cervical spine was performed following the standard protocol without intravenous contrast. Multiplanar CT image reconstructions of the cervical spine were also generated. COMPARISON:  None. FINDINGS: CT HEAD FINDINGS Brain: Complex broad-base left convexity subdural hematoma. Largest component appears hypodense (possibly chronic) measuring 10.1 mm maximal thickness. More acute appearing component (which is slightly hyperdense) measures up to 4.3 mm. Mild local mass effect upon adjacent frontal-parietal gyri. Minimal bowing  of the septum to the right. Thin left tentorial subdural hematoma. Tubular hyperdensity along the course of the left middle cerebral artery M1 segment may be related to thrombus or possibly tiny amount of blood. Chronic microvascular changes. Global atrophy. Vascular: As above. Skull: No skull fracture noted. Sinuses/Orbits: No acute orbital abnormality. Visualized paranasal sinuses are clear. Other: Mastoid air cells and middle ear cavities are clear. CT CERVICAL SPINE FINDINGS Alignment: Slight scoliosis convex left. Minimal anterior slip C4 secondary to right-sided facet degenerative changes. Skull base and vertebrae: No cervical spine fracture. Nonspecific 7 mm right C4 and C6 vertebral body lucency Soft tissues and spinal canal: No abnormal prevertebral soft tissue swelling. Disc levels: Scattered cervical spondylotic changes, most notable C6-7. No high-grade spinal stenosis. Upper chest: No worrisome abnormality Other: No worrisome abnormality IMPRESSION: CT HEAD: 1. Complex broad-base left convexity subdural hematoma. Largest component appears hypodense (possibly chronic) measuring 10.1 mm maximal thickness. More acute appearing component (which is slightly hyperdense) measures up to 4.3 mm. Mild local mass effect upon adjacent frontal-parietal gyri. Minimal bowing of the septum to the right. No skull fracture. 2. Thin left tentorial subdural hematoma. 3. Tubular hyperdensity along the course of the left middle cerebral artery M1 segment may be related to thrombus or possibly tiny amount of blood. 4. Chronic microvascular changes. 5. Global atrophy. CT CERVICAL SPINE: 1. Slight scoliosis convex left. Minimal anterior slip C4 secondary to right-sided facet degenerative changes. 2. No cervical spine fracture noted. No abnormal prevertebral soft tissue swelling. 3. Nonspecific 7 mm right C4 and C6 vertebral body lucency. These results were called by telephone at the time of interpretation on 01/05/2019 at 6:01 am  to Dr. Delora Fuel , who verbally acknowledged these results. Electronically Signed   By: Genia Del M.D.   On: 01/05/2019 06:19   Ct Angio  Neck W And/or Wo Contrast  Result Date: 01/05/2019 CLINICAL DATA:  Focal neuro deficit with stroke suspected EXAM: CT ANGIOGRAPHY HEAD AND NECK TECHNIQUE: Multidetector CT imaging of the head and neck was performed using the standard protocol during bolus administration of intravenous contrast. Multiplanar CT image reconstructions and MIPs were obtained to evaluate the vascular anatomy. Carotid stenosis measurements (when applicable) are obtained utilizing NASCET criteria, using the distal internal carotid diameter as the denominator. CONTRAST:  86mL ISOVUE-370 IOPAMIDOL (ISOVUE-370) INJECTION 76% COMPARISON:  Noncontrast head CT from earlier today FINDINGS: CTA NECK FINDINGS Aortic arch: Atherosclerosis.  Three vessel branching. Right carotid system: Mild atherosclerotic plaque. No stenosis or ulceration. Left carotid system: Mild atherosclerotic plaque. No stenosis or ulceration. Vertebral arteries: Proximal subclavian atherosclerosis without flow limiting stenosis. A tubal tortuosity. Both vertebral arteries are widely patent to the dura. Skeleton: Negative Other neck: No acute finding. Upper chest: Layering right pleural effusion. Review of the MIP images confirms the above findings CTA HEAD FINDINGS Anterior circulation: Left M1 to M2 occlusion with acute appearance causing a proximal meniscus sign. There is downstream under filled reconstitution vessels. Atherosclerotic calcification of the carotid siphons. Posterior circulation: Symmetric vertebral arteries. The vertebral and basilar arteries are smooth and widely patent. No branch occlusion or flow limiting stenosis. Venous sinuses: Patent as permitted by contrast timing Anatomic variants: None significant. Delayed phase: No abnormal intracranial enhancement. Known mixed density subdural hematoma along the left  cerebral convexity. Review of the MIP images confirms the above findings Critical Value/emergent results were called by telephone at the time of interpretation on 01/05/2019 at 7:53 am to Dr. Delora Fuel , who verbally acknowledged these results. IMPRESSION: 1. 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 There is underfilling of the reconstituted downstream vessels. 2. Mixed density subdural hematoma along the left cerebral convexity, see preceding noncontrast head CT. 3. Limited atherosclerosis for age. No flow limiting stenosis or ulceration. Electronically Signed   By: Monte Fantasia M.D.   On: 01/05/2019 07:59   Ct Cervical Spine Wo Contrast  Result Date: 01/05/2019 CLINICAL DATA:  83 year old female fell off toilet tonight. Garbled speech and mild facial droop. Initial encounter. EXAM: CT HEAD WITHOUT CONTRAST CT CERVICAL SPINE WITHOUT CONTRAST TECHNIQUE: Multidetector CT imaging of the head and cervical spine was performed following the standard protocol without intravenous contrast. Multiplanar CT image reconstructions of the cervical spine were also generated. COMPARISON:  None. FINDINGS: CT HEAD FINDINGS Brain: Complex broad-base left convexity subdural hematoma. Largest component appears hypodense (possibly chronic) measuring 10.1 mm maximal thickness. More acute appearing component (which is slightly hyperdense) measures up to 4.3 mm. Mild local mass effect upon adjacent frontal-parietal gyri. Minimal bowing of the septum to the right. Thin left tentorial subdural hematoma. Tubular hyperdensity along the course of the left middle cerebral artery M1 segment may be related to thrombus or possibly tiny amount of blood. Chronic microvascular changes. Global atrophy. Vascular: As above. Skull: No skull fracture noted. Sinuses/Orbits: No acute orbital abnormality. Visualized paranasal sinuses are clear. Other: Mastoid air cells and middle ear cavities are clear. CT CERVICAL SPINE FINDINGS  Alignment: Slight scoliosis convex left. Minimal anterior slip C4 secondary to right-sided facet degenerative changes. Skull base and vertebrae: No cervical spine fracture. Nonspecific 7 mm right C4 and C6 vertebral body lucency Soft tissues and spinal canal: No abnormal prevertebral soft tissue swelling. Disc levels: Scattered cervical spondylotic changes, most notable C6-7. No high-grade spinal stenosis. Upper chest: No worrisome abnormality Other: No worrisome abnormality IMPRESSION: CT  HEAD: 1. Complex broad-base left convexity subdural hematoma. Largest component appears hypodense (possibly chronic) measuring 10.1 mm maximal thickness. More acute appearing component (which is slightly hyperdense) measures up to 4.3 mm. Mild local mass effect upon adjacent frontal-parietal gyri. Minimal bowing of the septum to the right. No skull fracture. 2. Thin left tentorial subdural hematoma. 3. Tubular hyperdensity along the course of the left middle cerebral artery M1 segment may be related to thrombus or possibly tiny amount of blood. 4. Chronic microvascular changes. 5. Global atrophy. CT CERVICAL SPINE: 1. Slight scoliosis convex left. Minimal anterior slip C4 secondary to right-sided facet degenerative changes. 2. No cervical spine fracture noted. No abnormal prevertebral soft tissue swelling. 3. Nonspecific 7 mm right C4 and C6 vertebral body lucency. These results were called by telephone at the time of interpretation on 01/05/2019 at 6:01 am to Dr. Delora Fuel , who verbally acknowledged these results. Electronically Signed   By: Genia Del M.D.   On: 01/05/2019 06:19   Ir Pta Intracranial  Result Date: 01/08/2019 INDICATION: New onset dysphagia, right-sided weakness and left gaze deviation. 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 EXAM: 1. EMERGENT LARGE VESSEL OCCLUSION THROMBOLYSIS (anterior CIRCULATION) 2. Intracranial balloon angioplasty of the left middle cerebral artery.  COMPARISON:  CT angiogram of the head and neck of 01/05/2019. MEDICATIONS: Vancomycin 1 g IV antibiotic was administered within 1 hour of the procedure. ANESTHESIA/SEDATION: General anesthesia. CONTRAST:  Isovue 300 approximately 120 cc. FLUOROSCOPY TIME:  Fluoroscopy Time: 83 minutes 12 seconds (3019 mGy). COMPLICATIONS: None immediate. TECHNIQUE: Following a full explanation of the procedure along with the potential associated complications, an informed witnessed consent was obtained from the patient's daughter and husband. The risks of intracranial hemorrhage of 10%, worsening neurological deficit, ventilator dependency, death and inability to revascularize were all reviewed in detail with the patient's daughter and husband. The patient was then put under general anesthesia by the Department of Anesthesiology at [facility]. The right groin was prepped and draped in the usual sterile fashion. Thereafter using modified Seldinger technique, transfemoral access into the right common femoral artery was obtained without difficulty. Over a 0.035 inch guidewire a 5 French Pinnacle sheath was inserted. Through this, and also over a 0.035 inch guidewire a 5 [place] JB 1 catheter was advanced to the aortic arch region and selectively positioned in the left common carotid artery. FINDINGS: The left common carotid arteriogram demonstrates the left external carotid artery and its major branches to be widely patent. The left internal carotid artery at the bulb to the cranial skull base demonstrates wide patency with mild tortuosity proximally. The petrous, cavernous and supraclinoid segments are widely patent. The left middle cerebral artery demonstrates complete angiographic occlusion in the mid M1 segment at the origin of the anterior temporal branch. The left anterior cerebral artery is seen to opacify into the capillary and venous phases. The delayed arterial phase demonstrates retrograde opacification of the distal  left MCA distribution from the pericallosal and callosal marginal branches. Retrograde opacification is noted into the M3 regions of the perisylvian branches. PROCEDURE: ENDOVASCULAR REVASCULARIZATION OF OCCLUDED LEFT MIDDLE CEREBRAL ARTERY M1 SEGMENT WITH MECHANICAL THROMBECTOMY, AND ALSO BALLOON ANGIOPLASTY FOR RECURRENT OCCLUSION. The diagnostic JB 1 catheter in the left common carotid artery was exchanged over a 0.035 inch 300 cm Rosen exchange guidewire for an 8 French 55 cm Brite tip neurovascular sheath using biplane roadmap technique and constant fluoroscopic guidance. Good aspiration was obtained from the side port of  the neurovascular sheath. This was then connected to continuous heparinized saline infusion. Over the [employer] guidewire, an 8 [place] 85 cm balloon FlowGate guide catheter which had been prepped with 50% contrast and 50% heparinized saline infusion was advanced and positioned in the mid cervical left ICA. The guidewire was removed. Good aspiration obtained from the hub of the [facility] guide catheter. A gentle control arteriogram performed through [facility] guide catheter in the left internal carotid artery demonstrated no evidence of spasms, dissections or of intraluminal filling defects. No change was seen in the left MCA occlusion. In a coaxial manner and with constant heparinized saline infusion using biplane roadmap technique and constant fluoroscopic guidance, a combination of a 6 French 132 cm Catalyst guide catheter inside of which was an 021 Trevo ProVue microcatheter was advanced over a 0.014 inch Softip Synchro micro guidewire to the distal end of the [facility] guide catheter in the left internal carotid artery. With the micro guidewire leading with a J-tip configuration, the combination was navigated to the supraclinoid left ICA. The occluded left middle cerebral artery was then crossed with the micro guidewire without difficulty into the inferior division M2 M3 region followed  by the microcatheter. The guidewire was removed. Good aspiration was obtained from the hub of the microcatheter. This was then connected to continuous heparinized saline infusion. A 33 mm x 5 mm Embotrap retrieval device was then advanced into the distal end of the microcatheter. The proximal and the distal landing zones were then defined. The O ring on the delivery microcatheter was then loosened. With slight forward traction with the right hand on the delivery micro guidewire, with the left hand the delivery microcatheter was retrieved unsheathing the distal and then the proximal portion of the retrieval device. A gentle control arteriogram performed through Catalyst guide catheter in the supraclinoid left ICA demonstrated a revascularization. With proximal flow arrest in the left internal carotid artery by inflating the balloon of the [facility] guide catheter, the combination of the retrieval device, the microcatheter and the 6 [place] Catalyst guide catheter were retrieved and removed as aspiration was continued as the balloon was deflated in the left internal carotid artery. Aspiration was performed with a 60 mL syringe at the hub of the [facility] guide catheter, and with a Prenumbra aspiration device the hub of the 6 [place] Catalyst guide catheter. Free back bleed of blood was noted at the hub of the [facility] guide catheter. A gentle control arteriogram performed through the [facility] guide catheter in the left internal carotid artery demonstrated no change in the occluded left middle cerebral artery. No evidence of clots or debris was found in the retrieval device or the aspirate. Two more attempts at mechanical thrombectomy were then made. A second attempt was again using the combination as described above. Again after having established safe position of tip of the microcatheter and placement of the 5 mm x 33 mm Embotrap retrieval device, with proximal flow arrest in the left internal carotid, and aspiration at  the hub of the [facility] guide catheter with a 60 mL syringe, and with a Penumbra aspiration catheter at the hub of the 6 [place] Catalyst guide catheter, again with proximal flow arrest, the combination was retrieved and removed without evidence of clots in the aspirate or the retrieval device. A control arteriogram performed following this continued to demonstrate occluded left middle cerebral artery. A third attempt was made with using a combination of a 7 [place] Catalyst guide catheter inside of which was an  021 Trevo ProVue microcatheter again advanced over a 0.014 inch Softip Synchro micro guidewire to the distal end of the supraclinoid left ICA. Again access was obtained with the micro guidewire into M2 M3 region of the dominant inferior division followed by the microcatheter. After having established safe position of the tip of the microcatheter, and connected to heparinized saline infusion, a 4 mm x 40 mm Solitaire FR retrieval device was then advanced and deployed in a predetermined position. Again a control arteriogram performed through the Catalyst guide catheter in the supraclinoid left ICA continued to demonstrate a TICI 2b revascularization while the device was opened. Thereafter with proximal flow arrest, the combination of the retrieval device, the microcatheter and the 7 [place] Catalyst guide catheter was retrieved and removed as aspiration was applied with a 60 mL syringe at the hub of the [facility] guide catheter, and with a Penumbra device at the hub of the 7 [place] Catalyst guide catheter the combination was retrieved and removed. Back bleed was allowed after reversal of aspiration. At this time there were a few flecks of whitish material noted in the aspirate. 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 It was felt that the underlying pathology was  probably arteriosclerotic disease with significant stenosis. Placement of a stent across the tight occlusion of the left middle cerebral artery was felt to be potentially riskier in terms of post reperfusion hemorrhage and also with the patient having to be given dual antiplatelets. With the patient already being on warfarin, and also a history of subdural hematoma overlying the cerebral convexity on the left side, it was felt to proceed with intracranial angioplasty. A Gateway 1.5 mm x 15 mm balloon was then prepped and purged with 50% contrast and 50% heparinized saline infusion. This was then advanced inside of an intermediary catheter to the supraclinoid left ICA over a 0.014 inch Softip Synchro micro guidewire. The micro guidewire was then advanced through the occluded left middle cerebral artery followed by the placement of the balloon. An angioplasty was then performed with a micro inflation syringe device via micro tubing inflating the balloon to just over 6 atmospheres achieving a 1.6 mm diameter where a balloon was maintained for approximately a minute and a half. It was then deflated and retrieved slightly proximally with the wire maintained distally. A control arteriogram performed demonstrated now significantly improved caliber and flow through the angioplastied segment with a TICI 2b revascularization. An angiogram performed 3 minutes later demonstrated progressive worsening of the angioplastied segment prompting a second angioplasty again with the balloon being inflated gradually to 6.1 atmospheres achieving a diameter of just over 1.6 mm where it was maintained for approximately a minute. The balloon was then deflated and retrieved proximally. A control arteriogram performed through the intermediary catheter in the left internal carotid artery demonstrated again significantly improved caliber and flow through the angioplastied segment of the left middle cerebral artery. A TICI 2b revascularization  was established. A control arteriogram performed approximately 5 minutes after this again demonstrated complete occlusion of the left middle cerebral artery. At this time it was decided to stop as placement of the stent was not an option as per discussion above. The whole head arteriogram performed demonstrated no change in the previously noted robust collaterals from the anterior cerebral artery distribution. Throughout the procedure, the patient's blood pressure and neurological status remained stable. No angiographic evidence of  extravasation or mass-effect or midline shift was noted. A CT of the head performed on the table demonstrated no gross mass effect, midline shift or of intracranial hemorrhage. Stability of the previously noted subdural hematoma was seen. The 8 [place] FlowGate guide catheter and the 8 French neurovascular sheath were then retrieved over a J-wire and replaced with an [place] sheath. This in turn was then removed with successful hemostasis with the an 8 [place] Angio-Seal device. Distal pulses remained Dopplerable in the dorsalis pedis, and posterior regions bilaterally. The patient's general anesthesia was then reversed and the patient was extubated. Upon extubation the patient was able to maintain adequate oxygenation. She was able to move to some degree her left-side spontaneously. No significant motion was noted in the right upper or right lower extremity. Her pupils were 2 mm and equal. She was then transferred to the neuro ICU for further workup. IMPRESSION: Status post endovascular revascularization of occluded left middle cerebral artery with 2 passes with the 5 mm x 33 mm Embotrap retrieval device, and 1 pass with the 4 mm x 40 mm retrieval device, achieving a TICI 2b revascularization with subsequent reocclusion. Balloon angioplasty of occluded left middle cerebral artery probably due to underlying severe stenosis due to atherosclerosis with 2 passes again achieving a  TICI 2b revascularization with subsequent reocclusion. PLAN: Follow-up outpatient clinic 4 weeks post discharge. Electronically Signed   By: Luanne Bras M.D.   On: 01/06/2019 13:41   [facility]  Result Date: 01/08/2019 INDICATION: New onset dysphagia, right-sided weakness and left gaze deviation. 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 EXAM: 1. EMERGENT LARGE VESSEL OCCLUSION THROMBOLYSIS (anterior CIRCULATION) 2. Intracranial balloon angioplasty of the left middle cerebral artery. COMPARISON:  CT angiogram of the head and neck of 01/05/2019. MEDICATIONS: Vancomycin 1 g IV antibiotic was administered within 1 hour of the procedure. ANESTHESIA/SEDATION: General anesthesia. CONTRAST:  Isovue 300 approximately 120 cc. FLUOROSCOPY TIME:  Fluoroscopy Time: 83 minutes 12 seconds (3019 mGy). COMPLICATIONS: None immediate. TECHNIQUE: Following a full explanation of the procedure along with the potential associated complications, an informed witnessed consent was obtained from the patient's daughter and husband. The risks of intracranial hemorrhage of 10%, worsening neurological deficit, ventilator dependency, death and inability to revascularize were all reviewed in detail with the patient's daughter and husband. The patient was then put under general anesthesia by the Department of Anesthesiology at [facility]. The right groin was prepped and draped in the usual sterile fashion. Thereafter using modified Seldinger technique, transfemoral access into the right common femoral artery was obtained without difficulty. Over a 0.035 inch guidewire a 5 French Pinnacle sheath was inserted. Through this, and also over a 0.035 inch guidewire a 5 [place] JB 1 catheter was advanced to the aortic arch region and selectively positioned in the left common carotid artery. FINDINGS: The left common carotid arteriogram demonstrates the left external carotid artery and its major branches to be  widely patent. The left internal carotid artery at the bulb to the cranial skull base demonstrates wide patency with mild tortuosity proximally. The petrous, cavernous and supraclinoid segments are widely patent. The left middle cerebral artery demonstrates complete angiographic occlusion in the mid M1 segment at the origin of the anterior temporal branch. The left anterior cerebral artery is seen to opacify into the capillary and venous phases. The delayed arterial phase demonstrates retrograde opacification of the distal left MCA distribution from the pericallosal and callosal marginal branches. Retrograde  opacification is noted into the M3 regions of the perisylvian branches. PROCEDURE: ENDOVASCULAR REVASCULARIZATION OF OCCLUDED LEFT MIDDLE CEREBRAL ARTERY M1 SEGMENT WITH MECHANICAL THROMBECTOMY, AND ALSO BALLOON ANGIOPLASTY FOR RECURRENT OCCLUSION. The diagnostic JB 1 catheter in the left common carotid artery was exchanged over a 0.035 inch 300 cm Rosen exchange guidewire for an 8 French 55 cm Brite tip neurovascular sheath using biplane roadmap technique and constant fluoroscopic guidance. Good aspiration was obtained from the side port of the neurovascular sheath. This was then connected to continuous heparinized saline infusion. Over the [employer] guidewire, an 8 [place] 85 cm balloon FlowGate guide catheter which had been prepped with 50% contrast and 50% heparinized saline infusion was advanced and positioned in the mid cervical left ICA. The guidewire was removed. Good aspiration obtained from the hub of the [facility] guide catheter. A gentle control arteriogram performed through [facility] guide catheter in the left internal carotid artery demonstrated no evidence of spasms, dissections or of intraluminal filling defects. No change was seen in the left MCA occlusion. In a coaxial manner and with constant heparinized saline infusion using biplane roadmap technique and constant fluoroscopic guidance, a  combination of a 6 French 132 cm Catalyst guide catheter inside of which was an 021 Trevo ProVue microcatheter was advanced over a 0.014 inch Softip Synchro micro guidewire to the distal end of the [facility] guide catheter in the left internal carotid artery. With the micro guidewire leading with a J-tip configuration, the combination was navigated to the supraclinoid left ICA. The occluded left middle cerebral artery was then crossed with the micro guidewire without difficulty into the inferior division M2 M3 region followed by the microcatheter. The guidewire was removed. Good aspiration was obtained from the hub of the microcatheter. This was then connected to continuous heparinized saline infusion. A 33 mm x 5 mm Embotrap retrieval device was then advanced into the distal end of the microcatheter. The proximal and the distal landing zones were then defined. The O ring on the delivery microcatheter was then loosened. With slight forward traction with the right hand on the delivery micro guidewire, with the left hand the delivery microcatheter was retrieved unsheathing the distal and then the proximal portion of the retrieval device. A gentle control arteriogram performed through Catalyst guide catheter in the supraclinoid left ICA demonstrated a revascularization. With proximal flow arrest in the left internal carotid artery by inflating the balloon of the [facility] guide catheter, the combination of the retrieval device, the microcatheter and the 6 [place] Catalyst guide catheter were retrieved and removed as aspiration was continued as the balloon was deflated in the left internal carotid artery. Aspiration was performed with a 60 mL syringe at the hub of the [facility] guide catheter, and with a Prenumbra aspiration device the hub of the 6 [place] Catalyst guide catheter. Free back bleed of blood was noted at the hub of the [facility] guide catheter. A gentle control arteriogram performed through the [facility] guide  catheter in the left internal carotid artery demonstrated no change in the occluded left middle cerebral artery. No evidence of clots or debris was found in the retrieval device or the aspirate. Two more attempts at mechanical thrombectomy were then made. A second attempt was again using the combination as described above. Again after having established safe position of tip of the microcatheter and placement of the 5 mm x 33 mm Embotrap retrieval device, with proximal flow arrest in the left internal carotid, and aspiration at the hub of the  FlowGate guide catheter with a 60 mL syringe, and with a Penumbra aspiration catheter at the hub of the 6 [place] Catalyst guide catheter, again with proximal flow arrest, the combination was retrieved and removed without evidence of clots in the aspirate or the retrieval device. A control arteriogram performed following this continued to demonstrate occluded left middle cerebral artery. A third attempt was made with using a combination of a 7 [place] Catalyst guide catheter inside of which was an 021 Trevo ProVue microcatheter again advanced over a 0.014 inch Softip Synchro micro guidewire to the distal end of the supraclinoid left ICA. Again access was obtained with the micro guidewire into M2 M3 region of the dominant inferior division followed by the microcatheter. After having established safe position of the tip of the microcatheter, and connected to heparinized saline infusion, a 4 mm x 40 mm Solitaire FR retrieval device was then advanced and deployed in a predetermined position. Again a control arteriogram performed through the Catalyst guide catheter in the supraclinoid left ICA continued to demonstrate a TICI 2b revascularization while the device was opened. Thereafter with proximal flow arrest, the combination of the retrieval device, the microcatheter and the 7 [place] Catalyst guide catheter was retrieved and removed as aspiration was applied with a 60 mL syringe at  the hub of the [facility] guide catheter, and with a Penumbra device at the hub of the 7 [place] Catalyst guide catheter the combination was retrieved and removed. Back bleed was allowed after reversal of aspiration. At this time there were a few flecks of whitish material noted in the aspirate. 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 It was felt that the underlying pathology was probably arteriosclerotic disease with significant stenosis. Placement of a stent across the tight occlusion of the left middle cerebral artery was felt to be potentially riskier in terms of post reperfusion hemorrhage and also with the patient having to be given dual antiplatelets. With the patient already being on warfarin, and also a history of subdural hematoma overlying the cerebral convexity on the left side, it was felt to proceed with intracranial angioplasty. A Gateway 1.5 mm x 15 mm balloon was then prepped and purged with 50% contrast and 50% heparinized saline infusion. This was then advanced inside of an intermediary catheter to the supraclinoid left ICA over a 0.014 inch Softip Synchro micro guidewire. The micro guidewire was then advanced through the occluded left middle cerebral artery followed by the placement of the balloon. An angioplasty was then performed with a micro inflation syringe device via micro tubing inflating the balloon to just over 6 atmospheres achieving a 1.6 mm diameter where a balloon was maintained for approximately a minute and a half. It was then deflated and retrieved slightly proximally with the wire maintained distally. A control arteriogram performed demonstrated now significantly improved caliber and flow through the angioplastied segment with a TICI 2b revascularization. An angiogram performed 3 minutes later demonstrated progressive worsening of  the angioplastied segment prompting a second angioplasty again with the balloon being inflated gradually to 6.1 atmospheres achieving a diameter of just over 1.6 mm where it was maintained for approximately a minute. The balloon was then deflated and retrieved proximally. A control arteriogram performed through the intermediary catheter in the left internal carotid artery demonstrated again significantly improved caliber and flow through the angioplastied segment of the left middle cerebral artery. A  TICI 2b revascularization was established. A control arteriogram performed approximately 5 minutes after this again demonstrated complete occlusion of the left middle cerebral artery. At this time it was decided to stop as placement of the stent was not an option as per discussion above. The whole head arteriogram performed demonstrated no change in the previously noted robust collaterals from the anterior cerebral artery distribution. Throughout the procedure, the patient's blood pressure and neurological status remained stable. No angiographic evidence of extravasation or mass-effect or midline shift was noted. A CT of the head performed on the table demonstrated no gross mass effect, midline shift or of intracranial hemorrhage. Stability of the previously noted subdural hematoma was seen. The 8 [place] FlowGate guide catheter and the 8 French neurovascular sheath were then retrieved over a J-wire and replaced with an [place] sheath. This in turn was then removed with successful hemostasis with the an 8 [place] Angio-Seal device. Distal pulses remained Dopplerable in the dorsalis pedis, and posterior regions bilaterally. The patient's general anesthesia was then reversed and the patient was extubated. Upon extubation the patient was able to maintain adequate oxygenation. She was able to move to some degree her left-side spontaneously. No significant motion was noted in the right upper or right lower extremity.  Her pupils were 2 mm and equal. She was then transferred to the neuro ICU for further workup. IMPRESSION: Status post endovascular revascularization of occluded left middle cerebral artery with 2 passes with the 5 mm x 33 mm Embotrap retrieval device, and 1 pass with the 4 mm x 40 mm retrieval device, achieving a TICI 2b revascularization with subsequent reocclusion. Balloon angioplasty of occluded left middle cerebral artery probably due to underlying severe stenosis due to atherosclerosis with 2 passes again achieving a TICI 2b revascularization with subsequent reocclusion. PLAN: Follow-up outpatient clinic 4 weeks post discharge. Electronically Signed   By: Luanne Bras M.D.   On: 01/06/2019 13:41   Ir Percutaneous Art Thrombectomy/infusion Intracranial Inc Diag Angio  Result Date: 01/08/2019 INDICATION: New onset dysphagia, right-sided weakness and left gaze deviation. 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 EXAM: 1. EMERGENT LARGE VESSEL OCCLUSION THROMBOLYSIS (anterior CIRCULATION) 2. Intracranial balloon angioplasty of the left middle cerebral artery. COMPARISON:  CT angiogram of the head and neck of 01/05/2019. MEDICATIONS: Vancomycin 1 g IV antibiotic was administered within 1 hour of the procedure. ANESTHESIA/SEDATION: General anesthesia. CONTRAST:  Isovue 300 approximately 120 cc. FLUOROSCOPY TIME:  Fluoroscopy Time: 83 minutes 12 seconds (3019 mGy). COMPLICATIONS: None immediate. TECHNIQUE: Following a full explanation of the procedure along with the potential associated complications, an informed witnessed consent was obtained from the patient's daughter and husband. The risks of intracranial hemorrhage of 10%, worsening neurological deficit, ventilator dependency, death and inability to revascularize were all reviewed in detail with the patient's daughter and husband. The patient was then put under general anesthesia by the Department of Anesthesiology at [facility]. The right groin was prepped and draped in the usual sterile fashion. Thereafter using modified Seldinger technique, transfemoral access into the right common femoral artery was obtained without difficulty. Over a 0.035 inch guidewire a 5 French Pinnacle sheath was inserted. Through this, and also over a 0.035 inch guidewire a 5 [place] JB 1 catheter was advanced to the aortic arch region and selectively positioned in the left common carotid artery. FINDINGS: The left common carotid arteriogram demonstrates the left external carotid artery and its major branches to be widely patent. The left  internal carotid artery at the bulb to the cranial skull base demonstrates wide patency with mild tortuosity proximally. The petrous, cavernous and supraclinoid segments are widely patent. The left middle cerebral artery demonstrates complete angiographic occlusion in the mid M1 segment at the origin of the anterior temporal branch. The left anterior cerebral artery is seen to opacify into the capillary and venous phases. The delayed arterial phase demonstrates retrograde opacification of the distal left MCA distribution from the pericallosal and callosal marginal branches. Retrograde opacification is noted into the M3 regions of the perisylvian branches. PROCEDURE: ENDOVASCULAR REVASCULARIZATION OF OCCLUDED LEFT MIDDLE CEREBRAL ARTERY M1 SEGMENT WITH MECHANICAL THROMBECTOMY, AND ALSO BALLOON ANGIOPLASTY FOR RECURRENT OCCLUSION. The diagnostic JB 1 catheter in the left common carotid artery was exchanged over a 0.035 inch 300 cm Rosen exchange guidewire for an 8 [place] 55 cm Brite tip neurovascular sheath using biplane roadmap technique and constant fluoroscopic guidance. Good aspiration was obtained from the side port of the neurovascular sheath. This was then connected to continuous heparinized saline infusion. Over the [employer] guidewire, an 8 [place] 85 cm balloon FlowGate guide catheter which had been prepped  with 50% contrast and 50% heparinized saline infusion was advanced and positioned in the mid cervical left ICA. The guidewire was removed. Good aspiration obtained from the hub of the [facility] guide catheter. A gentle control arteriogram performed through [facility] guide catheter in the left internal carotid artery demonstrated no evidence of spasms, dissections or of intraluminal filling defects. No change was seen in the left MCA occlusion. In a coaxial manner and with constant heparinized saline infusion using biplane roadmap technique and constant fluoroscopic guidance, a combination of a 6 [place] 132 cm Catalyst guide catheter inside of which was an 021 Trevo ProVue microcatheter was advanced over a 0.014 inch Softip Synchro micro guidewire to the distal end of the [facility] guide catheter in the left internal carotid artery. With the micro guidewire leading with a J-tip configuration, the combination was navigated to the supraclinoid left ICA. The occluded left middle cerebral artery was then crossed with the micro guidewire without difficulty into the inferior division M2 M3 region followed by the microcatheter. The guidewire was removed. Good aspiration was obtained from the hub of the microcatheter. This was then connected to continuous heparinized saline infusion. A 33 mm x 5 mm Embotrap retrieval device was then advanced into the distal end of the microcatheter. The proximal and the distal landing zones were then defined. The O ring on the delivery microcatheter was then loosened. With slight forward traction with the right hand on the delivery micro guidewire, with the left hand the delivery microcatheter was retrieved unsheathing the distal and then the proximal portion of the retrieval device. A gentle control arteriogram performed through Catalyst guide catheter in the supraclinoid left ICA demonstrated a revascularization. With proximal flow arrest in the left internal carotid artery by inflating the  balloon of the [facility] guide catheter, the combination of the retrieval device, the microcatheter and the 6 [place] Catalyst guide catheter were retrieved and removed as aspiration was continued as the balloon was deflated in the left internal carotid artery. Aspiration was performed with a 60 mL syringe at the hub of the [facility] guide catheter, and with a Prenumbra aspiration device the hub of the 6 [place] Catalyst guide catheter. Free back bleed of blood was noted at the hub of the [facility] guide catheter. A gentle control arteriogram performed through the [facility] guide catheter in the left internal carotid  artery demonstrated no change in the occluded left middle cerebral artery. No evidence of clots or debris was found in the retrieval device or the aspirate. Two more attempts at mechanical thrombectomy were then made. A second attempt was again using the combination as described above. Again after having established safe position of tip of the microcatheter and placement of the 5 mm x 33 mm Embotrap retrieval device, with proximal flow arrest in the left internal carotid, and aspiration at the hub of the [facility], The guide catheter with a 60 mL syringe, and with a Penumbra aspiration catheter at the hub of the 6 [place] Catalyst guide catheter, again with proximal flow arrest, the combination was retrieved and removed without evidence of clots in the aspirate or the retrieval device. A control arteriogram performed following this continued to demonstrate occluded left middle cerebral artery. A third attempt was made with using a combination of a 7 [place] Catalyst guide catheter inside of which was an 021 Trevo ProVue microcatheter again advanced over a 0.014 inch Softip Synchro micro guidewire to the distal end of the supraclinoid left ICA. Again access was obtained with the micro guidewire into M2 M3 region of the dominant inferior division followed by the microcatheter. After having established safe position of  the tip of the microcatheter, and connected to heparinized saline infusion, a 4 mm x 40 mm Solitaire FR retrieval device was then advanced and deployed in a predetermined position. Again a control arteriogram performed through the Catalyst guide catheter in the supraclinoid left ICA continued to demonstrate a TICI 2b revascularization while the device was opened. Thereafter with proximal flow arrest, the combination of the retrieval device, the microcatheter and the 7 [place] Catalyst guide catheter was retrieved and removed as aspiration was applied with a 60 mL syringe at the hub of the [facility] guide catheter, and with a Penumbra device at the hub of the 7 [place] Catalyst guide catheter the combination was retrieved and removed. Back bleed was allowed after reversal of aspiration. At this time there were a few flecks of whitish material noted in the aspirate. A control arteriogram performed through the [facility] guide catheter in the left internal carotid artery demonstrated continued complete occlusion of the left middle cerebral artery unchanged. It was felt that the underlying pathology was probably arteriosclerotic disease with significant stenosis. Placement of a stent across the tight occlusion of the left middle cerebral artery was felt to be potentially riskier in terms of post reperfusion hemorrhage and also with the patient having to be given dual antiplatelets. With the patient already being on warfarin, and also a history of subdural hematoma overlying the cerebral convexity on the left side, it was felt to proceed with intracranial angioplasty. A Gateway 1.5 mm x 15 mm balloon was then prepped and purged with 50% contrast and 50% heparinized saline infusion. This was then advanced inside of an intermediary catheter to the supraclinoid left ICA over a 0.014 inch Softip Synchro micro guidewire. The micro guidewire was then advanced through the occluded left middle cerebral artery followed by the  placement of the balloon. An angioplasty was then performed with a micro inflation syringe device via micro tubing inflating the balloon to just over 6 atmospheres achieving a 1.6 mm diameter where a balloon was maintained for approximately a minute and a half. It was then deflated and retrieved slightly proximally with the wire maintained distally. A control arteriogram performed demonstrated now significantly improved caliber and flow through the angioplastied segment with a TICI  2b revascularization. An angiogram performed 3 minutes later demonstrated progressive worsening of the angioplastied segment prompting a second angioplasty again with the balloon being inflated gradually to 6.1 atmospheres achieving a diameter of just over 1.6 mm where it was maintained for approximately a minute. The balloon was then deflated and retrieved proximally. A control arteriogram performed through the intermediary catheter in the left internal carotid artery demonstrated again significantly improved caliber and flow through the angioplastied segment of the left middle cerebral artery. A TICI 2b revascularization was established. A control arteriogram performed approximately 5 minutes after this again demonstrated complete occlusion of the left middle cerebral artery. At this time it was decided to stop as placement of the stent was not an option as per discussion above. The whole head arteriogram performed demonstrated no change in the previously noted robust collaterals from the anterior cerebral artery distribution. Throughout the procedure, the patient's blood pressure and neurological status remained stable. No angiographic evidence of extravasation or mass-effect or midline shift was noted. A CT of the head performed on the table demonstrated no gross mass effect, midline shift or of intracranial hemorrhage. Stability of the previously noted subdural hematoma was seen. The 8 [place] FlowGate guide catheter and the 8 French  neurovascular sheath were then retrieved over a J-wire and replaced with an [place] sheath. This in turn was then removed with successful hemostasis with the an 8 [place] Angio-Seal device. Distal pulses remained Dopplerable in the dorsalis pedis, and posterior regions bilaterally. The patient's general anesthesia was then reversed and the patient was extubated. Upon extubation the patient was able to maintain adequate oxygenation. She was able to move to some degree her left-side spontaneously. No significant motion was noted in the right upper or right lower extremity. Her pupils were 2 mm and equal. She was then transferred to the neuro ICU for further workup. IMPRESSION: Status post endovascular revascularization of occluded left middle cerebral artery with 2 passes with the 5 mm x 33 mm Embotrap retrieval device, and 1 pass with the 4 mm x 40 mm retrieval device, achieving a TICI 2b revascularization with subsequent reocclusion. Balloon angioplasty of occluded left middle cerebral artery probably due to underlying severe stenosis due to atherosclerosis with 2 passes again achieving a TICI 2b revascularization with subsequent reocclusion. PLAN: Follow-up outpatient clinic 4 weeks post discharge. Electronically Signed   By: Luanne Bras M.D.   On: 01/06/2019 13:41     ASSESSMENT Ms. MYKAILA BLUNCK is a 83 y.o. female with history of diabetes, hyperlipidemia, hypothyroidism, breast cancer, proximal atrial fibrillation on warfarin who had a fall 2 weeks ago and again the morning of admission presenting with right facial droop and speech difficulty.   Stroke:  presumed left MCA infarct due to left M1 occlusion status pacemaker failed mechanical thrombectomy and balloon angioplasty. Stent not deployed due to concurrent subdural hematoma and increased risk of hemorrhage.  Stroke etiology likely due to A. fib on Coumadin with subtherapeutic INR.  Resultant left gaze preference, right  hemianopia, right neglect, right hemiplegia, global aphasia  CT head broad-based left convexity SDH. Thin left tentorial SDH.  Tubular hypodensity L MCA M1 possible thrombus.   CTA head & neck ELVO L M1 thrombus.  Mixed density SDH left cerebral convexity.  IR with endovascular revascularization and angioplasty x2 with continued TICI2b revascularization with reocclusion.   Repeat CT head 01/07/19 no acute abnormality   MRI and MRA pending  2D Echo  EF 60-65%. No source of  embolus   EEG x 2 no seizure   LDL 35  HgbA1c 6.9  SCDs for VTE prophylaxis  warfarin daily prior to admission, now on aspirin 81  Therapy recommendations:  pending   Disposition:  pending   SDH  CT head 01/05/2019 showed broad-based left convexity SDH and seen left tentorial SDH  Fell 2 weeks ago just prior to pacer surgery per son had bruise on her head  [place] again prior to admission  SDH likely acute on chronic  Repeat CT head 01/07/2019 showed improving left convexity SDH  Continue monitoring  Atrial Fibrillation  Home anticoagulation:  warfarin daily   INR on admission 1.59  Warfarin on hold given SDH  Home meds: metoprolol 50 bid   slightly elevated troponin likely d/t AF - 0.03.  Now on aspirin 81  Status post pacemaker  Done on 12/23/2018 in [place] device  Likely MRI compatible  MRI and MRA pending  Hypertension   No previous dx  Home meds: metoprolol, lasix  On cleviprex post IR - BP goal per IR 24h post procedure  Echo normal EF  Hyperlipidemia  Home meds:  lipitor 20 and lovaza  LDL 35, goal < 70  On Lipitor 10  Resume home at discharge  Diabetes type II  HgbA1c 6.9, at goal < 7.0  Controlled  SSI  CBG monitoring  Other Stroke Risk Factors  Advanced age  Other Active Problems  Small layering right pleural effusion seen on chest x-ray  Hx L breast cancer  Hospital day # 4  Rosalin Hawking, MD PhD Stroke  Neurology 01/09/2019 4:09 PM  To contact Stroke Continuity provider, please refer to [email or web address] After hours, contact General Neurology

## 2019-01-10 ENCOUNTER — Inpatient Hospital Stay (HOSPITAL_COMMUNITY): Payer: Medicare Other

## 2019-01-10 LAB — GLUCOSE, CAPILLARY
GLUCOSE-CAPILLARY: 210 mg/dL — AB (ref 70–99)
Glucose-Capillary: 267 mg/dL — ABNORMAL HIGH (ref 70–99)
Glucose-Capillary: 242 mg/dL — ABNORMAL HIGH (ref 70–99)
Glucose-Capillary: 283 mg/dL — ABNORMAL HIGH (ref 70–99)
Glucose-Capillary: 260 mg/dL — ABNORMAL HIGH (ref 70–99)

## 2019-01-10 LAB — BASIC METABOLIC PANEL
Anion gap: 8 (ref 5–15)
BUN: 20 mg/dL (ref 8–23)
CO2: 26 mmol/L (ref 22–32)
Calcium: 8.9 mg/dL (ref 8.9–10.3)
Chloride: 110 mmol/L (ref 98–111)
Creatinine, Ser: 0.72 mg/dL (ref 0.44–1.00)
GFR calc Af Amer: >60 mL/min (ref >60)
GFR calc non Af Amer: >60 mL/min (ref >60)
Glucose, Bld: 267 mg/dL — ABNORMAL HIGH (ref 70–99)
Potassium: 3.7 mmol/L (ref 3.5–5.1)
Sodium: 144 mmol/L (ref 135–145)

## 2019-01-10 LAB — CBC
HCT: 30.7 % — ABNORMAL LOW (ref 36.0–46.0)
Hemoglobin: 9.5 g/dL — ABNORMAL LOW (ref 12.0–15.0)
MCH: 30.3 pg (ref 26.0–34.0)
MCHC: 30.9 g/dL (ref 30.0–36.0)
MCV: 97.8 fL (ref 80.0–100.0)
Platelets: 265 10*3/uL (ref 150–400)
RBC: 3.14 MIL/uL — ABNORMAL LOW (ref 3.87–5.11)
RDW: 15.6 % — AB (ref 11.5–15.5)
WBC: 11.1 10*3/uL — ABNORMAL HIGH (ref 4.0–10.5)
nRBC: 0 % (ref 0.0–0.2)

## 2019-01-10 LAB — URINALYSIS, COMPLETE (UACMP) WITH MICROSCOPIC
Bilirubin Urine: NEGATIVE
Glucose, UA: >=500 mg/dL — AB
KETONES UR: NEGATIVE mg/dL
Nitrite: NEGATIVE
PROTEIN: 100 mg/dL — AB
Specific Gravity, Urine: 1.032 — ABNORMAL HIGH (ref 1.005–1.030)
pH: 5 (ref 5.0–8.0)

## 2019-01-10 LAB — BLOOD GAS, ARTERIAL
Acid-Base Excess: 3.6 mmol/L — ABNORMAL HIGH (ref 0.0–2.0)
Bicarbonate: 25.5 mmol/L (ref 20.0–28.0)
Drawn by: 105521
FIO2: 0.21
O2 SAT: 97.2 %
Patient temperature: 98.6
pCO2 arterial: 25.8 mmHg — ABNORMAL LOW (ref 32.0–48.0)
pH, Arterial: 7.6 — ABNORMAL HIGH (ref 7.350–7.450)
pO2, Arterial: 75.5 mmHg — ABNORMAL LOW (ref 83.0–108.0)

## 2019-01-10 MED ORDER — INSULIN GLARGINE 100 UNIT/ML ~~LOC~~ SOLN
25.0000 [IU] | Freq: Two times a day (BID) | SUBCUTANEOUS | Status: DC
  Administered 2019-01-10 – 2019-01-11 (×3): 25 [IU] via SUBCUTANEOUS
  Filled 2019-01-10 (×3): qty 0.25

## 2019-01-10 MED ORDER — HEPARIN SODIUM (PORCINE) 5000 UNIT/ML IJ SOLN
5000.0000 [IU] | Freq: Three times a day (TID) | INTRAMUSCULAR | Status: DC
  Administered 2019-01-10 – 2019-01-14 (×12): 5000 [IU] via SUBCUTANEOUS
  Filled 2019-01-10 (×13): qty 1

## 2019-01-10 NOTE — Progress Notes (Signed)
PROGRESS NOTE    Stacy [name]  QIO:[id number]  DOB: 1936/01/07  DOA: 01/05/2019 PCP: Rusty Aus, MD  Brief Narrative:   83 year old female with history of hypertension hyperlipidemia, Diabetes mellitus, hypothyroidism, breast cancer,A. fib on chronic anticoagulation with warfarin who had a fall 2 weeks back as well as on the day of admission presented to this [facility] on January 21 with right facial droop and aphasia.  Patient felt to have left MCA stroke due to left MI occlusion.  Seen by neurology.  She failed mechanical thrombectomy and balloon angioplasty attempt, stent not deployed due to concurrent subdural hematoma and increased risk of hemorrhage.  Neurology has been following this patient closely.  Anticoagulation has been on hold due to subdural hematoma.  Imaging studies of the head have not shown brain infarcts on CT scan x3.  Repeat EEG, negative for epileptiform activities  Subjective: -Appears slightly more lethargic today, slight increased work of breathing  Assessment & Plan:   1.  Acute left MCA stroke, due to M1 occlusion -Status post failed mechanical thrombectomy x2 and balloon angioplasty -CTA head & neck ELVO L M1 thrombus.  Mixed density SDH left cerebral convexity. Cerebral angiogram endovascular TICI2b revascularization L M1 occlusion with 2 passes embotrap, 1 pass Solitaire.  Angioplasty x2 with continued TICI2b revascularization with reocclusion.  -[place] and repeat CT head noted subdural hematoma -Stent could not be deployed due to concurrent subdural hematoma of moderate size and increased risk of hemorrhage if antiplatelets were needed -Unable to have an MRI due to pacemaker, stroke not noted on repeat CTs -EEG negative for epileptiform activity -Per neuro stroke was likely embolic from A. fib with subtherapeutic INR -With residual dense right hemiplegia, global aphasia and severe dysphagia -2D echocardiogram with preserved EF, no cardiac  source of embolus -LDL was 35, hemoglobin A1c 6.9 -discussed overall prognosis with husband and granddaughter at bedside 1/24, they agree that PEG tube would not be what she would want in such a situation, placed a core track, started tube feeds 1/24, plan to reassess in 2 to 3 days will need palliative care evaluation if dysphagia/aphasia and weakness shows no signs of improvement -remains off anticoagulation due to subdural hematoma, continue statin -Started on aspirin 81 mg p.o. tube  -PT/OT eval ongoing  3.  Mild worsening lethargy and work of breathing -Check chest x-ray, definitely at risk for aspiration of secretions  4.  Left subdural hematoma: In the setting of falls/chronic anticoagulation.  Maximum thickness at 10 mm with mild local mass-effect. -Coumadin discontinued  5.  Chronic atrial fibrillation:  -restart Toprol at a lower dose, anticoagulation on hold as above  6.  Diabetes mellitus type 2:  -Hemoglobin A1c at 6.9.   -CBG is elevated after starting tube feeds, add Lantus today  7.  History of left breast cancer:  8. Elevated LFTs: monitor on statins   DVT prophylaxis: SCD Code Status: DNR Family / Patient Communication: Discussed with husband bedside  yesterday Disposition Plan: To be determined  Objective: Vitals:   01/09/19 2337 01/10/19 0432 01/10/19 0750 01/10/19 1135  BP: 135/76 (!) 153/86 139/90 140/70  Pulse: 77 88 97 94  Resp:   (!) 24 (!) 22  Temp: 98.8 F (37.1 C) 98.5 F (36.9 C) 98.2 F (36.8 C) 99.3 F (37.4 C)  TempSrc: Oral Oral Oral Axillary  SpO2: 100% 97% 100% 98%  Weight:      Height:        Intake/Output Summary (Last 24  hours) at 01/10/2019 1320 Last data filed at 01/09/2019 2022 Gross per 24 hour  Intake -  Output 800 ml  Net -800 ml   Filed Weights   01/06/19 1400 01/09/19 0409  Weight: 71.1 kg 72.9 kg    Physical Examination:  Gen: Pale, chronically ill-appearing female, more lethargic today, global aphasia, left gaze  preference HEENT: PERRLA, Neck supple, no JVD Lungs: Decreased breath sounds both bases, poor air movement CVS: RRR,No Gallops,Rubs or new Murmurs Abd: soft, Non tender, non distended, BS present Extremities: Trace edema Skin: no new rashes Neuro: Global aphasia, dense right hemiplegia  Data Reviewed: I have personally reviewed following labs and imaging studies  CBC: Recent Labs  Lab 01/05/19 0559 01/06/19 0510 01/07/19 0454 01/08/19 0639 01/09/19 0407 01/10/19 1001  WBC 8.6 13.3* 11.4* 8.7 8.8 11.1*  NEUTROABS 6.8 10.0*  --   --   --   --   HGB 10.1* 8.0* 8.3* 8.0* 8.5* 9.5*  HCT 32.6* 25.7* 26.8* 25.6* 27.3* 30.7*  MCV 97.3 98.8 100.0 98.1 96.8 97.8  PLT 194 233 224 228 242 814   Basic Metabolic Panel: Recent Labs  Lab 01/06/19 0510 01/07/19 0454 01/08/19 0639 01/09/19 0407 01/10/19 1001  NA 140 141 143 142 144  K 3.6 3.7 2.9* 3.3* 3.7  CL 111 112* 114* 111 110  CO2 18* 16* 18* 22 26  GLUCOSE 130* 177* 155* 260* 267*  BUN 20 20 16 16 20   CREATININE 1.09* 1.01* 0.96 0.66 0.72  CALCIUM 8.4* 8.3* 8.4* 8.7* 8.9   GFR: Estimated Creatinine Clearance: 56.1 mL/min (by C-G formula based on SCr of 0.72 mg/dL). Liver Function Tests: Recent Labs  Lab 01/05/19 0559  AST 47*  ALT 175*  ALKPHOS 51  BILITOT 1.4*  PROT 6.0*  ALBUMIN 3.2*   No results for input(s): LIPASE, AMYLASE in the last 168 hours. Recent Labs  Lab 01/07/19 0943  AMMONIA 14   Coagulation Profile: Recent Labs  Lab 01/05/19 0559  INR 1.59   Cardiac Enzymes: Recent Labs  Lab 01/05/19 0559  TROPONINI 0.03*   BNP (last 3 results) No results for input(s): PROBNP in the last 8760 hours. HbA1C: No results for input(s): HGBA1C in the last 72 hours. CBG: Recent Labs  Lab 01/09/19 2043 01/09/19 2335 01/10/19 0431 01/10/19 0746 01/10/19 1132  GLUCAP 238* 330* 267* 210* 242*   Lipid Profile: No results for input(s): CHOL, HDL, LDLCALC, TRIG, CHOLHDL, LDLDIRECT in the last 72  hours. Thyroid Function Tests: No results for input(s): TSH, T4TOTAL, FREET4, T3FREE, THYROIDAB in the last 72 hours. Anemia Panel: No results for input(s): VITAMINB12, FOLATE, FERRITIN, TIBC, IRON, RETICCTPCT in the last 72 hours. Sepsis Labs: No results for input(s): PROCALCITON, LATICACIDVEN in the last 168 hours.  Recent Results (from the past 240 hour(s))  MRSA PCR Screening     Status: None   Collection Time: 01/05/19  1:03 PM  Result Value Ref Range Status   MRSA by PCR NEGATIVE NEGATIVE Final    Comment:        The GeneXpert MRSA Assay (FDA approved for NASAL specimens only), is one component of a comprehensive MRSA colonization surveillance program. It is not intended to diagnose MRSA infection nor to guide or monitor treatment for MRSA infections. Performed at [facility], [place] [address] [place], [place] [address]       Radiology Studies: Dg Chest Port 1 View  Result Date: 01/10/2019 CLINICAL DATA:  Left middle cerebral artery thrombosis. Labored breathing  this morning. EXAM: PORTABLE CHEST 1 VIEW COMPARISON:  01/05/2019 FINDINGS: Stable heart size and appearance of pacemaker. Feeding tube extends into the expected region of the proximal duodenum. Lungs show minimal bibasilar atelectasis. There is no evidence of pulmonary edema, consolidation, pneumothorax, nodule or pleural fluid. IMPRESSION: No acute findings with minimal bibasilar atelectasis present. Electronically Signed   By: Aletta Edouard M.D.   On: 01/10/2019 09:59        Scheduled Meds: . aspirin  81 mg Per Tube Daily  . atorvastatin  10 mg Per Tube q1800  . chlorhexidine  15 mL Mouth Rinse BID  . fluticasone  2 spray Each Nare Daily  . gabapentin  100 mg Per Tube BID  . heparin injection (subcutaneous)  5,000 Units Subcutaneous Q8H  . insulin aspart  0-15 Units Subcutaneous Q4H  . insulin glargine  25 Units Subcutaneous BID  . iopamidol  100 mL Intravenous Once  . levothyroxine  75 mcg Per  Tube Q0600  . magnesium oxide  400 mg Per Tube Daily  . mouth rinse  15 mL Mouth Rinse BID  . metoprolol tartrate  25 mg Per Tube BID  . pantoprazole sodium  40 mg Per Tube Daily   Continuous Infusions: . feeding supplement (JEVITY 1.2 CAL) 60 mL/hr at 01/09/19 1200      LOS: 5 days    Time spent: 26min    Domenic Polite, MD  01/10/2019, 1:20 PM

## 2019-01-10 NOTE — Progress Notes (Signed)
Patient MEWS score is in yellow zone; patient having desaturation pattern on her continous pulse ox meter; Sats are ranging down to 88-89%; Oxygen applied at 2 liters; Dr. Broadus John notified. O2 saturations are now 94-98 % with oxygen. HOB maintained 40 degress or higher; patient is for f/u CXR in am. Patient arousable to stimulation; vital signs will be monitored Q 2 hour X 2.

## 2019-01-10 NOTE — Progress Notes (Signed)
Pt has [employer]onic Pacemaker implanted on 12/23/18. Spoke to [employer]. About MR model and conditions. While the generator and leads are conditional for scanning the pt cannot be scanned safely until 6 weeks after implant date per [employer]. RN aware, will let Dr. Gwyndolyn Kaufman.

## 2019-01-10 NOTE — Progress Notes (Signed)
HOB @ 17 degrees this AM, pt labored breathing. O2 sat 100 RA. Stopped TF, raised HOB. MD notified. Gave verbal order to hold TF and stat chest xray. Will continue to monitor

## 2019-01-10 NOTE — Progress Notes (Signed)
STROKE TEAM PROGRESS NOTE   SUBJECTIVE Husband and daughters are at the bedside.  She seems more lethargic today with mild SOB. CXR unremarkable. Afebrile. WBC elevated at 1.11. glucose was high.   OBJECTIVE Vitals Blood pressure 139/90, pulse 97, temperature 98.2 F (36.8 C), temperature source Oral, resp. rate (!) 24, height 5\' 5"  (1.651 m), weight 72.9 kg, SpO2 100 %.   Physical Exam Frail elderly Caucasian lady currently not in distress. Afebrile. Head is nontraumatic. Neck is supple without bruit.    Cardiac exam no murmur or gallop. Lungs are clear to auscultation. Distal pulses are well felt. Neurological Exam Lethargic, barely open eyes with painful stimuli. Globally aphasic. Not following any commands. Left gaze preference and did not look to right past midline today. Pupils 3 mm sluggishly reactive. Fundi not visualized.  Right hemianopia and neglect.  Significant right facial droop. Tongue midline in mouth. Weak movements on the left side with pain. Dense right hemiplegia with only trace withdrawal to nail bed pressure in the right arm and leg. Flaccid hypotonia in the right and normal tone on the left. Right plantar upgoing left downgoing. Sensation, coordination and gait not tested.   Medications . aspirin  81 mg Per Tube Daily  . atorvastatin  10 mg Per Tube q1800  . chlorhexidine  15 mL Mouth Rinse BID  . fluticasone  2 spray Each Nare Daily  . gabapentin  100 mg Per Tube BID  . insulin aspart  0-15 Units Subcutaneous Q4H  . insulin glargine  25 Units Subcutaneous BID  . iopamidol  100 mL Intravenous Once  . levothyroxine  75 mcg Per Tube Q0600  . magnesium oxide  400 mg Per Tube Daily  . mouth rinse  15 mL Mouth Rinse BID  . metoprolol tartrate  25 mg Per Tube BID  . pantoprazole sodium  40 mg Per Tube Daily     Pertinent Laboratory Studies/Diagnostics Recent Labs    01/08/19 0639 01/09/19 0407  WBC 8.7 8.8  HGB 8.0* 8.5*  PLT 228 242  NA 143 142  K 2.9* 3.3*   CREATININE 0.96 0.66  GLUCOSE 155* 260*    Ct Angio Head W Or Wo Contrast  Result Date: 01/05/2019 CLINICAL DATA:  Focal neuro deficit with stroke suspected EXAM: CT ANGIOGRAPHY HEAD AND NECK TECHNIQUE: Multidetector CT imaging of the head and neck was performed using the standard protocol during bolus administration of intravenous contrast. Multiplanar CT image reconstructions and MIPs were obtained to evaluate the vascular anatomy. Carotid stenosis measurements (when applicable) are obtained utilizing NASCET criteria, using the distal internal carotid diameter as the denominator. CONTRAST:  31mL ISOVUE-370 IOPAMIDOL (ISOVUE-370) INJECTION 76% COMPARISON:  Noncontrast head CT from earlier today FINDINGS: CTA NECK FINDINGS Aortic arch: Atherosclerosis.  Three vessel branching. Right carotid system: Mild atherosclerotic plaque. No stenosis or ulceration. Left carotid system: Mild atherosclerotic plaque. No stenosis or ulceration. Vertebral arteries: Proximal subclavian atherosclerosis without flow limiting stenosis. A tubal tortuosity. Both vertebral arteries are widely patent to the dura. Skeleton: Negative Other neck: No acute finding. Upper chest: Layering right pleural effusion. Review of the MIP images confirms the above findings CTA HEAD FINDINGS Anterior circulation: Left M1 to M2 occlusion with acute appearance causing a proximal meniscus sign. There is downstream under filled reconstitution vessels. Atherosclerotic calcification of the carotid siphons. Posterior circulation: Symmetric vertebral arteries. The vertebral and basilar arteries are smooth and widely patent. No branch occlusion or flow limiting stenosis. Venous sinuses: Patent as permitted by contrast  timing Anatomic variants: None significant. Delayed phase: No abnormal intracranial enhancement. Known mixed density subdural hematoma along the left cerebral convexity. Review of the MIP images confirms the above findings Critical  Value/emergent results were called by telephone at the time of interpretation on 01/05/2019 at 7:53 am to Dr. Delora Fuel , who verbally acknowledged these results. IMPRESSION: 1. 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 There is underfilling of the reconstituted downstream vessels. 2. Mixed density subdural hematoma along the left cerebral convexity, see preceding noncontrast head CT. 3. Limited atherosclerosis for age. No flow limiting stenosis or ulceration. Electronically Signed   By: Monte Fantasia M.D.   On: 01/05/2019 07:59   Dg Chest 2 View  Result Date: 01/05/2019 CLINICAL DATA:  Initial evaluation for acute trauma, fall. EXAM: CHEST - 2 VIEW COMPARISON:  Prior radiograph from 09/02/2018 FINDINGS: There has been interval placement of a triple lead transvenous pacemaker/AICD. Cardiomegaly is relatively stable. Mediastinal silhouette within normal limits. Aortic atherosclerosis. Lungs hypoinflated. No focal infiltrates. No pulmonary edema. Small layering right pleural effusion seen posteriorly on lateral projection. No pneumothorax. No acute osseous finding. Multiple surgical clips overlie the left axilla. IMPRESSION: 1. Small layering right pleural effusion. 2. No other active cardiopulmonary disease. Electronically Signed   By: Jeannine Boga M.D.   On: 01/05/2019 06:15   Dg Clavicle Left  Result Date: 01/05/2019 CLINICAL DATA:  Initial evaluation for acute left clavicular pain status post fall. EXAM: LEFT CLAVICLE - 2+ VIEWS COMPARISON:  None. FINDINGS: No acute fracture or dislocation. Osteoarthritic changes present at the left [facility] joint. Pacemaker electrodes partially overlie the clavicle. Visualized left lung apex clear. No soft tissue abnormality. IMPRESSION: No acute osseous abnormality about the left clavicle. Electronically Signed   By: Jeannine Boga M.D.   On: 01/05/2019 06:17   Ct Head Wo Contrast  Addendum Date: 01/07/2019   ADDENDUM REPORT: 01/07/2019 04:46  ADDENDUM: Correction: FINDINGS: Similar to decreased 8 mm LEFT (not RIGHT) holo hemispheric mixed density subdural hematoma. Electronically Signed   By: Elon Alas M.D.   On: 01/07/2019 04:46   Result Date: 01/07/2019 CLINICAL DATA:  Follow up stroke. Status post endovascular revascularization of LEFT MCA occlusion. EXAM: CT HEAD WITHOUT CONTRAST TECHNIQUE: Contiguous axial images were obtained from the base of the skull through the vertex without intravenous contrast. COMPARISON:  None. FINDINGS: BRAIN: No intraparenchymal hemorrhage, mass effect nor midline shift. The ventricles and sulci are normal for age. Patchy supratentorial white matter hypodensities within normal range for patient's age, though non-specific are most compatible with chronic small vessel ischemic disease. No acute large vascular territory infarcts. Similar to decreased 8 mm RIGHT holo hemispheric mixed density subdural hematoma. VASCULAR: Moderate calcific atherosclerosis of the carotid siphons. Asymmetrically dense LEFT MCA best appreciated on sagittal series. The SKULL: No skull fracture. No significant scalp soft tissue swelling. SINUSES/ORBITS: Paranasal sinuses are well aerated. Trace bilateral mastoid effusions. Included ocular globes and orbital contents are non-suspicious. Status post bilateral ocular lens implants. OTHER: None. IMPRESSION: 1. Dense LEFT MCA: Recurrent occlusion possible versus enhancing residual thrombus. 2. No acute infarct. 3. Similar to decreased mixed density LEFT holo hemispheric subdural hematoma. 4. These results will be called to the ordering clinician or representative by the professional radiologist assistant, and communication documented in [employer] Dashboard. Electronically Signed: By: Elon Alas M.D. On: 01/06/2019 05:14   Ct Head Wo Contrast  Result Date: 01/07/2019 CLINICAL DATA:  Follow up stroke. Status post endovascular revascularization of LEFT MCA occlusion history  of breast  cancer. EXAM: CT HEAD WITHOUT CONTRAST TECHNIQUE: Contiguous axial images were obtained from the base of the skull through the vertex without intravenous contrast. COMPARISON:  CT HEAD January 06, 2018. FINDINGS: BRAIN: No intraparenchymal hemorrhage, mass effect nor midline shift. The ventricles and sulci are normal for age. Patchy supratentorial white matter hypodensities compatible with chronic small vessel ischemic changes. No acute large vascular territory infarcts. Stable 8 mm mixed density LEFT holo hemispheric subdural hematoma. Basal cisterns are patent. VASCULAR: Moderate calcific atherosclerosis of the carotid siphons. SKULL: No skull fracture. No significant scalp soft tissue swelling. SINUSES/ORBITS: Trace paranasal sinus mucosal thickening. Minimal mastoid effusions. Included ocular globes and orbital contents are non-suspicious. Status post bilateral ocular lens implants. OTHER: None. IMPRESSION: 1. No acute intracranial process. 2. Stable 8 mm mixed density LEFT holo hemispheric subdural hematoma. Electronically Signed   By: Elon Alas M.D.   On: 01/07/2019 04:45   Ct Head Wo Contrast  Result Date: 01/05/2019 CLINICAL DATA:  83 year old female fell off toilet tonight. Garbled speech and mild facial droop. Initial encounter. EXAM: CT HEAD WITHOUT CONTRAST CT CERVICAL SPINE WITHOUT CONTRAST TECHNIQUE: Multidetector CT imaging of the head and cervical spine was performed following the standard protocol without intravenous contrast. Multiplanar CT image reconstructions of the cervical spine were also generated. COMPARISON:  None. FINDINGS: CT HEAD FINDINGS Brain: Complex broad-base left convexity subdural hematoma. Largest component appears hypodense (possibly chronic) measuring 10.1 mm maximal thickness. More acute appearing component (which is slightly hyperdense) measures up to 4.3 mm. Mild local mass effect upon adjacent frontal-parietal gyri. Minimal bowing of the septum to the right.  Thin left tentorial subdural hematoma. Tubular hyperdensity along the course of the left middle cerebral artery M1 segment may be related to thrombus or possibly tiny amount of blood. Chronic microvascular changes. Global atrophy. Vascular: As above. Skull: No skull fracture noted. Sinuses/Orbits: No acute orbital abnormality. Visualized paranasal sinuses are clear. Other: Mastoid air cells and middle ear cavities are clear. CT CERVICAL SPINE FINDINGS Alignment: Slight scoliosis convex left. Minimal anterior slip C4 secondary to right-sided facet degenerative changes. Skull base and vertebrae: No cervical spine fracture. Nonspecific 7 mm right C4 and C6 vertebral body lucency Soft tissues and spinal canal: No abnormal prevertebral soft tissue swelling. Disc levels: Scattered cervical spondylotic changes, most notable C6-7. No high-grade spinal stenosis. Upper chest: No worrisome abnormality Other: No worrisome abnormality IMPRESSION: CT HEAD: 1. Complex broad-base left convexity subdural hematoma. Largest component appears hypodense (possibly chronic) measuring 10.1 mm maximal thickness. More acute appearing component (which is slightly hyperdense) measures up to 4.3 mm. Mild local mass effect upon adjacent frontal-parietal gyri. Minimal bowing of the septum to the right. No skull fracture. 2. Thin left tentorial subdural hematoma. 3. Tubular hyperdensity along the course of the left middle cerebral artery M1 segment may be related to thrombus or possibly tiny amount of blood. 4. Chronic microvascular changes. 5. Global atrophy. CT CERVICAL SPINE: 1. Slight scoliosis convex left. Minimal anterior slip C4 secondary to right-sided facet degenerative changes. 2. No cervical spine fracture noted. No abnormal prevertebral soft tissue swelling. 3. Nonspecific 7 mm right C4 and C6 vertebral body lucency. These results were called by telephone at the time of interpretation on 01/05/2019 at 6:01 am to Dr. Delora Fuel , who  verbally acknowledged these results. Electronically Signed   By: Genia Del M.D.   On: 01/05/2019 06:19   Ct Angio Neck W And/or Wo Contrast  Result Date: 01/05/2019 CLINICAL DATA:  Focal neuro deficit with stroke suspected EXAM: CT ANGIOGRAPHY HEAD AND NECK TECHNIQUE: Multidetector CT imaging of the head and neck was performed using the standard protocol during bolus administration of intravenous contrast. Multiplanar CT image reconstructions and MIPs were obtained to evaluate the vascular anatomy. Carotid stenosis measurements (when applicable) are obtained utilizing NASCET criteria, using the distal internal carotid diameter as the denominator. CONTRAST:  78mL ISOVUE-370 IOPAMIDOL (ISOVUE-370) INJECTION 76% COMPARISON:  Noncontrast head CT from earlier today FINDINGS: CTA NECK FINDINGS Aortic arch: Atherosclerosis.  Three vessel branching. Right carotid system: Mild atherosclerotic plaque. No stenosis or ulceration. Left carotid system: Mild atherosclerotic plaque. No stenosis or ulceration. Vertebral arteries: Proximal subclavian atherosclerosis without flow limiting stenosis. A tubal tortuosity. Both vertebral arteries are widely patent to the dura. Skeleton: Negative Other neck: No acute finding. Upper chest: Layering right pleural effusion. Review of the MIP images confirms the above findings CTA HEAD FINDINGS Anterior circulation: Left M1 to M2 occlusion with acute appearance causing a proximal meniscus sign. There is downstream under filled reconstitution vessels. Atherosclerotic calcification of the carotid siphons. Posterior circulation: Symmetric vertebral arteries. The vertebral and basilar arteries are smooth and widely patent. No branch occlusion or flow limiting stenosis. Venous sinuses: Patent as permitted by contrast timing Anatomic variants: None significant. Delayed phase: No abnormal intracranial enhancement. Known mixed density subdural hematoma along the left cerebral convexity. Review  of the MIP images confirms the above findings Critical Value/emergent results were called by telephone at the time of interpretation on 01/05/2019 at 7:53 am to Dr. Delora Fuel , who verbally acknowledged these results. IMPRESSION: 1. 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 There is underfilling of the reconstituted downstream vessels. 2. Mixed density subdural hematoma along the left cerebral convexity, see preceding noncontrast head CT. 3. Limited atherosclerosis for age. No flow limiting stenosis or ulceration. Electronically Signed   By: Monte Fantasia M.D.   On: 01/05/2019 07:59   Ct Cervical Spine Wo Contrast  Result Date: 01/05/2019 CLINICAL DATA:  83 year old female fell off toilet tonight. Garbled speech and mild facial droop. Initial encounter. EXAM: CT HEAD WITHOUT CONTRAST CT CERVICAL SPINE WITHOUT CONTRAST TECHNIQUE: Multidetector CT imaging of the head and cervical spine was performed following the standard protocol without intravenous contrast. Multiplanar CT image reconstructions of the cervical spine were also generated. COMPARISON:  None. FINDINGS: CT HEAD FINDINGS Brain: Complex broad-base left convexity subdural hematoma. Largest component appears hypodense (possibly chronic) measuring 10.1 mm maximal thickness. More acute appearing component (which is slightly hyperdense) measures up to 4.3 mm. Mild local mass effect upon adjacent frontal-parietal gyri. Minimal bowing of the septum to the right. Thin left tentorial subdural hematoma. Tubular hyperdensity along the course of the left middle cerebral artery M1 segment may be related to thrombus or possibly tiny amount of blood. Chronic microvascular changes. Global atrophy. Vascular: As above. Skull: No skull fracture noted. Sinuses/Orbits: No acute orbital abnormality. Visualized paranasal sinuses are clear. Other: Mastoid air cells and middle ear cavities are clear. CT CERVICAL SPINE FINDINGS Alignment: Slight scoliosis convex  left. Minimal anterior slip C4 secondary to right-sided facet degenerative changes. Skull base and vertebrae: No cervical spine fracture. Nonspecific 7 mm right C4 and C6 vertebral body lucency Soft tissues and spinal canal: No abnormal prevertebral soft tissue swelling. Disc levels: Scattered cervical spondylotic changes, most notable C6-7. No high-grade spinal stenosis. Upper chest: No worrisome abnormality Other: No worrisome abnormality IMPRESSION: CT HEAD: 1. Complex broad-base left convexity subdural hematoma. Largest component appears hypodense (  possibly chronic) measuring 10.1 mm maximal thickness. More acute appearing component (which is slightly hyperdense) measures up to 4.3 mm. Mild local mass effect upon adjacent frontal-parietal gyri. Minimal bowing of the septum to the right. No skull fracture. 2. Thin left tentorial subdural hematoma. 3. Tubular hyperdensity along the course of the left middle cerebral artery M1 segment may be related to thrombus or possibly tiny amount of blood. 4. Chronic microvascular changes. 5. Global atrophy. CT CERVICAL SPINE: 1. Slight scoliosis convex left. Minimal anterior slip C4 secondary to right-sided facet degenerative changes. 2. No cervical spine fracture noted. No abnormal prevertebral soft tissue swelling. 3. Nonspecific 7 mm right C4 and C6 vertebral body lucency. These results were called by telephone at the time of interpretation on 01/05/2019 at 6:01 am to Dr. Delora Fuel , who verbally acknowledged these results. Electronically Signed   By: Genia Del M.D.   On: 01/05/2019 06:19   Ir Pta Intracranial  Result Date: 01/08/2019 INDICATION: New onset dysphagia, right-sided weakness and left gaze deviation. 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 EXAM: 1. EMERGENT LARGE VESSEL OCCLUSION THROMBOLYSIS (anterior CIRCULATION) 2. Intracranial balloon angioplasty of the left middle cerebral artery. COMPARISON:  CT angiogram of the head and  neck of 01/05/2019. MEDICATIONS: Vancomycin 1 g IV antibiotic was administered within 1 hour of the procedure. ANESTHESIA/SEDATION: General anesthesia. CONTRAST:  Isovue 300 approximately 120 cc. FLUOROSCOPY TIME:  Fluoroscopy Time: 83 minutes 12 seconds (3019 mGy). COMPLICATIONS: None immediate. TECHNIQUE: Following a full explanation of the procedure along with the potential associated complications, an informed witnessed consent was obtained from the patient's daughter and husband. The risks of intracranial hemorrhage of 10%, worsening neurological deficit, ventilator dependency, death and inability to revascularize were all reviewed in detail with the patient's daughter and husband. The patient was then put under general anesthesia by the Department of Anesthesiology at [facility]. The right groin was prepped and draped in the usual sterile fashion. Thereafter using modified Seldinger technique, transfemoral access into the right common femoral artery was obtained without difficulty. Over a 0.035 inch guidewire a 5 French Pinnacle sheath was inserted. Through this, and also over a 0.035 inch guidewire a 5 [place] JB 1 catheter was advanced to the aortic arch region and selectively positioned in the left common carotid artery. FINDINGS: The left common carotid arteriogram demonstrates the left external carotid artery and its major branches to be widely patent. The left internal carotid artery at the bulb to the cranial skull base demonstrates wide patency with mild tortuosity proximally. The petrous, cavernous and supraclinoid segments are widely patent. The left middle cerebral artery demonstrates complete angiographic occlusion in the mid M1 segment at the origin of the anterior temporal branch. The left anterior cerebral artery is seen to opacify into the capillary and venous phases. The delayed arterial phase demonstrates retrograde opacification of the distal left MCA distribution from the  pericallosal and callosal marginal branches. Retrograde opacification is noted into the M3 regions of the perisylvian branches. PROCEDURE: ENDOVASCULAR REVASCULARIZATION OF OCCLUDED LEFT MIDDLE CEREBRAL ARTERY M1 SEGMENT WITH MECHANICAL THROMBECTOMY, AND ALSO BALLOON ANGIOPLASTY FOR RECURRENT OCCLUSION. The diagnostic JB 1 catheter in the left common carotid artery was exchanged over a 0.035 inch 300 cm Rosen exchange guidewire for an 8 French 55 cm Brite tip neurovascular sheath using biplane roadmap technique and constant fluoroscopic guidance. Good aspiration was obtained from the side port of the neurovascular sheath. This was then connected to continuous heparinized saline infusion.  Over the [employer] guidewire, an 8 [place] 85 cm balloon FlowGate guide catheter which had been prepped with 50% contrast and 50% heparinized saline infusion was advanced and positioned in the mid cervical left ICA. The guidewire was removed. Good aspiration obtained from the hub of the [facility] guide catheter. A gentle control arteriogram performed through [facility] guide catheter in the left internal carotid artery demonstrated no evidence of spasms, dissections or of intraluminal filling defects. No change was seen in the left MCA occlusion. In a coaxial manner and with constant heparinized saline infusion using biplane roadmap technique and constant fluoroscopic guidance, a combination of a 6 French 132 cm Catalyst guide catheter inside of which was an 021 Trevo ProVue microcatheter was advanced over a 0.014 inch Softip Synchro micro guidewire to the distal end of the [facility] guide catheter in the left internal carotid artery. With the micro guidewire leading with a J-tip configuration, the combination was navigated to the supraclinoid left ICA. The occluded left middle cerebral artery was then crossed with the micro guidewire without difficulty into the inferior division M2 M3 region followed by the microcatheter. The  guidewire was removed. Good aspiration was obtained from the hub of the microcatheter. This was then connected to continuous heparinized saline infusion. A 33 mm x 5 mm Embotrap retrieval device was then advanced into the distal end of the microcatheter. The proximal and the distal landing zones were then defined. The O ring on the delivery microcatheter was then loosened. With slight forward traction with the right hand on the delivery micro guidewire, with the left hand the delivery microcatheter was retrieved unsheathing the distal and then the proximal portion of the retrieval device. A gentle control arteriogram performed through Catalyst guide catheter in the supraclinoid left ICA demonstrated a revascularization. With proximal flow arrest in the left internal carotid artery by inflating the balloon of the [facility] guide catheter, the combination of the retrieval device, the microcatheter and the 6 [place] Catalyst guide catheter were retrieved and removed as aspiration was continued as the balloon was deflated in the left internal carotid artery. Aspiration was performed with a 60 mL syringe at the hub of the [facility] guide catheter, and with a Prenumbra aspiration device the hub of the 6 [place] Catalyst guide catheter. Free back bleed of blood was noted at the hub of the [facility] guide catheter. A gentle control arteriogram performed through the [facility] guide catheter in the left internal carotid artery demonstrated no change in the occluded left middle cerebral artery. No evidence of clots or debris was found in the retrieval device or the aspirate. Two more attempts at mechanical thrombectomy were then made. A second attempt was again using the combination as described above. Again after having established safe position of tip of the microcatheter and placement of the 5 mm x 33 mm Embotrap retrieval device, with proximal flow arrest in the left internal carotid, and aspiration at the hub of the [facility]  guide catheter with a 60 mL syringe, and with a Penumbra aspiration catheter at the hub of the 6 [place] Catalyst guide catheter, again with proximal flow arrest, the combination was retrieved and removed without evidence of clots in the aspirate or the retrieval device. A control arteriogram performed following this continued to demonstrate occluded left middle cerebral artery. A third attempt was made with using a combination of a 7 [place] Catalyst guide catheter inside of which was an 021 Trevo ProVue microcatheter again advanced over a 0.014 inch Softip Synchro  micro guidewire to the distal end of the supraclinoid left ICA. Again access was obtained with the micro guidewire into M2 M3 region of the dominant inferior division followed by the microcatheter. After having established safe position of the tip of the microcatheter, and connected to heparinized saline infusion, a 4 mm x 40 mm Solitaire FR retrieval device was then advanced and deployed in a predetermined position. Again a control arteriogram performed through the Catalyst guide catheter in the supraclinoid left ICA continued to demonstrate a TICI 2b revascularization while the device was opened. Thereafter with proximal flow arrest, the combination of the retrieval device, the microcatheter and the 7 [place] Catalyst guide catheter was retrieved and removed as aspiration was applied with a 60 mL syringe at the hub of the [facility] guide catheter, and with a Penumbra device at the hub of the 7 [place] Catalyst guide catheter the combination was retrieved and removed. Back bleed was allowed after reversal of aspiration. At this time there were a few flecks of whitish material noted in the aspirate. 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 It was felt that the underlying pathology was probably arteriosclerotic  disease with significant stenosis. Placement of a stent across the tight occlusion of the left middle cerebral artery was felt to be potentially riskier in terms of post reperfusion hemorrhage and also with the patient having to be given dual antiplatelets. With the patient already being on warfarin, and also a history of subdural hematoma overlying the cerebral convexity on the left side, it was felt to proceed with intracranial angioplasty. A Gateway 1.5 mm x 15 mm balloon was then prepped and purged with 50% contrast and 50% heparinized saline infusion. This was then advanced inside of an intermediary catheter to the supraclinoid left ICA over a 0.014 inch Softip Synchro micro guidewire. The micro guidewire was then advanced through the occluded left middle cerebral artery followed by the placement of the balloon. An angioplasty was then performed with a micro inflation syringe device via micro tubing inflating the balloon to just over 6 atmospheres achieving a 1.6 mm diameter where a balloon was maintained for approximately a minute and a half. It was then deflated and retrieved slightly proximally with the wire maintained distally. A control arteriogram performed demonstrated now significantly improved caliber and flow through the angioplastied segment with a TICI 2b revascularization. An angiogram performed 3 minutes later demonstrated progressive worsening of the angioplastied segment prompting a second angioplasty again with the balloon being inflated gradually to 6.1 atmospheres achieving a diameter of just over 1.6 mm where it was maintained for approximately a minute. The balloon was then deflated and retrieved proximally. A control arteriogram performed through the intermediary catheter in the left internal carotid artery demonstrated again significantly improved caliber and flow through the angioplastied segment of the left middle cerebral artery. A TICI 2b revascularization was established. A control  arteriogram performed approximately 5 minutes after this again demonstrated complete occlusion of the left middle cerebral artery. At this time it was decided to stop as placement of the stent was not an option as per discussion above. The whole head arteriogram performed demonstrated no change in the previously noted robust collaterals from the anterior cerebral artery distribution. Throughout the procedure, the patient's blood pressure and neurological status remained stable. No angiographic evidence of extravasation or mass-effect or midline shift was noted. A CT of the  head performed on the table demonstrated no gross mass effect, midline shift or of intracranial hemorrhage. Stability of the previously noted subdural hematoma was seen. The 8 [place] FlowGate guide catheter and the 8 French neurovascular sheath were then retrieved over a J-wire and replaced with an [place] sheath. This in turn was then removed with successful hemostasis with the an 8 [place] Angio-Seal device. Distal pulses remained Dopplerable in the dorsalis pedis, and posterior regions bilaterally. The patient's general anesthesia was then reversed and the patient was extubated. Upon extubation the patient was able to maintain adequate oxygenation. She was able to move to some degree her left-side spontaneously. No significant motion was noted in the right upper or right lower extremity. Her pupils were 2 mm and equal. She was then transferred to the neuro ICU for further workup. IMPRESSION: Status post endovascular revascularization of occluded left middle cerebral artery with 2 passes with the 5 mm x 33 mm Embotrap retrieval device, and 1 pass with the 4 mm x 40 mm retrieval device, achieving a TICI 2b revascularization with subsequent reocclusion. Balloon angioplasty of occluded left middle cerebral artery probably due to underlying severe stenosis due to atherosclerosis with 2 passes again achieving a TICI 2b revascularization with  subsequent reocclusion. PLAN: Follow-up outpatient clinic 4 weeks post discharge. Electronically Signed   By: Luanne Bras M.D.   On: 01/06/2019 13:41   [place]  Result Date: 01/08/2019 INDICATION: New onset dysphagia, right-sided weakness and left gaze deviation. 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 EXAM: 1. EMERGENT LARGE VESSEL OCCLUSION THROMBOLYSIS (anterior CIRCULATION) 2. Intracranial balloon angioplasty of the left middle cerebral artery. COMPARISON:  CT angiogram of the head and neck of 01/05/2019. MEDICATIONS: Vancomycin 1 g IV antibiotic was administered within 1 hour of the procedure. ANESTHESIA/SEDATION: General anesthesia. CONTRAST:  Isovue 300 approximately 120 cc. FLUOROSCOPY TIME:  Fluoroscopy Time: 83 minutes 12 seconds (3019 mGy). COMPLICATIONS: None immediate. TECHNIQUE: Following a full explanation of the procedure along with the potential associated complications, an informed witnessed consent was obtained from the patient's daughter and husband. The risks of intracranial hemorrhage of 10%, worsening neurological deficit, ventilator dependency, death and inability to revascularize were all reviewed in detail with the patient's daughter and husband. The patient was then put under general anesthesia by the Department of Anesthesiology at [facility]. The right groin was prepped and draped in the usual sterile fashion. Thereafter using modified Seldinger technique, transfemoral access into the right common femoral artery was obtained without difficulty. Over a 0.035 inch guidewire a 5 French Pinnacle sheath was inserted. Through this, and also over a 0.035 inch guidewire a 5 [place] JB 1 catheter was advanced to the aortic arch region and selectively positioned in the left common carotid artery. FINDINGS: The left common carotid arteriogram demonstrates the left external carotid artery and its major branches to be widely patent. The left internal  carotid artery at the bulb to the cranial skull base demonstrates wide patency with mild tortuosity proximally. The petrous, cavernous and supraclinoid segments are widely patent. The left middle cerebral artery demonstrates complete angiographic occlusion in the mid M1 segment at the origin of the anterior temporal branch. The left anterior cerebral artery is seen to opacify into the capillary and venous phases. The delayed arterial phase demonstrates retrograde opacification of the distal left MCA distribution from the pericallosal and callosal marginal branches. Retrograde opacification is noted into the M3 regions of the perisylvian branches. PROCEDURE:  ENDOVASCULAR REVASCULARIZATION OF OCCLUDED LEFT MIDDLE CEREBRAL ARTERY M1 SEGMENT WITH MECHANICAL THROMBECTOMY, AND ALSO BALLOON ANGIOPLASTY FOR RECURRENT OCCLUSION. The diagnostic JB 1 catheter in the left common carotid artery was exchanged over a 0.035 inch 300 cm Rosen exchange guidewire for an 8 French 55 cm Brite tip neurovascular sheath using biplane roadmap technique and constant fluoroscopic guidance. Good aspiration was obtained from the side port of the neurovascular sheath. This was then connected to continuous heparinized saline infusion. Over the [employer] guidewire, an 8 [place] 85 cm balloon FlowGate guide catheter which had been prepped with 50% contrast and 50% heparinized saline infusion was advanced and positioned in the mid cervical left ICA. The guidewire was removed. Good aspiration obtained from the hub of the [facility] guide catheter. A gentle control arteriogram performed through [place] guide catheter in the left internal carotid artery demonstrated no evidence of spasms, dissections or of intraluminal filling defects. No change was seen in the left MCA occlusion. In a coaxial manner and with constant heparinized saline infusion using biplane roadmap technique and constant fluoroscopic guidance, a combination of a 6 French 132 cm  Catalyst guide catheter inside of which was an 021 Trevo ProVue microcatheter was advanced over a 0.014 inch Softip Synchro micro guidewire to the distal end of the [facility] guide catheter in the left internal carotid artery. With the micro guidewire leading with a J-tip configuration, the combination was navigated to the supraclinoid left ICA. The occluded left middle cerebral artery was then crossed with the micro guidewire without difficulty into the inferior division M2 M3 region followed by the microcatheter. The guidewire was removed. Good aspiration was obtained from the hub of the microcatheter. This was then connected to continuous heparinized saline infusion. A 33 mm x 5 mm Embotrap retrieval device was then advanced into the distal end of the microcatheter. The proximal and the distal landing zones were then defined. The O ring on the delivery microcatheter was then loosened. With slight forward traction with the right hand on the delivery micro guidewire, with the left hand the delivery microcatheter was retrieved unsheathing the distal and then the proximal portion of the retrieval device. A gentle control arteriogram performed through Catalyst guide catheter in the supraclinoid left ICA demonstrated a revascularization. With proximal flow arrest in the left internal carotid artery by inflating the balloon of the [facility] guide catheter, the combination of the retrieval device, the microcatheter and the 6 [place] Catalyst guide catheter were retrieved and removed as aspiration was continued as the balloon was deflated in the left internal carotid artery. Aspiration was performed with a 60 mL syringe at the hub of the [facility] guide catheter, and with a Prenumbra aspiration device the hub of the 6 [place] Catalyst guide catheter. Free back bleed of blood was noted at the hub of the [facility] guide catheter. A gentle control arteriogram performed through the [facility] guide catheter in the left internal  carotid artery demonstrated no change in the occluded left middle cerebral artery. No evidence of clots or debris was found in the retrieval device or the aspirate. Two more attempts at mechanical thrombectomy were then made. A second attempt was again using the combination as described above. Again after having established safe position of tip of the microcatheter and placement of the 5 mm x 33 mm Embotrap retrieval device, with proximal flow arrest in the left internal carotid, and aspiration at the hub of the [facility] guide catheter with a 60 mL syringe, and with a Penumbra  aspiration catheter at the hub of the 6 [place] Catalyst guide catheter, again with proximal flow arrest, the combination was retrieved and removed without evidence of clots in the aspirate or the retrieval device. A control arteriogram performed following this continued to demonstrate occluded left middle cerebral artery. A third attempt was made with using a combination of a 7 [place] Catalyst guide catheter inside of which was an 021 Trevo ProVue microcatheter again advanced over a 0.014 inch Softip Synchro micro guidewire to the distal end of the supraclinoid left ICA. Again access was obtained with the micro guidewire into M2 M3 region of the dominant inferior division followed by the microcatheter. After having established safe position of the tip of the microcatheter, and connected to heparinized saline infusion, a 4 mm x 40 mm Solitaire FR retrieval device was then advanced and deployed in a predetermined position. Again a control arteriogram performed through the Catalyst guide catheter in the supraclinoid left ICA continued to demonstrate a TICI 2b revascularization while the device was opened. Thereafter with proximal flow arrest, the combination of the retrieval device, the microcatheter and the 7 [place] Catalyst guide catheter was retrieved and removed as aspiration was applied with a 60 mL syringe at the hub of the [facility] guide  catheter, and with a Penumbra device at the hub of the 7 [place] Catalyst guide catheter the combination was retrieved and removed. Back bleed was allowed after reversal of aspiration. At this time there were a few flecks of whitish material noted in the aspirate. 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 It was felt that the underlying pathology was probably arteriosclerotic disease with significant stenosis. Placement of a stent across the tight occlusion of the left middle cerebral artery was felt to be potentially riskier in terms of post reperfusion hemorrhage and also with the patient having to be given dual antiplatelets. With the patient already being on warfarin, and also a history of subdural hematoma overlying the cerebral convexity on the left side, it was felt to proceed with intracranial angioplasty. A Gateway 1.5 mm x 15 mm balloon was then prepped and purged with 50% contrast and 50% heparinized saline infusion. This was then advanced inside of an intermediary catheter to the supraclinoid left ICA over a 0.014 inch Softip Synchro micro guidewire. The micro guidewire was then advanced through the occluded left middle cerebral artery followed by the placement of the balloon. An angioplasty was then performed with a micro inflation syringe device via micro tubing inflating the balloon to just over 6 atmospheres achieving a 1.6 mm diameter where a balloon was maintained for approximately a minute and a half. It was then deflated and retrieved slightly proximally with the wire maintained distally. A control arteriogram performed demonstrated now significantly improved caliber and flow through the angioplastied segment with a TICI 2b revascularization. An angiogram performed 3 minutes later demonstrated progressive worsening of the angioplastied segment  prompting a second angioplasty again with the balloon being inflated gradually to 6.1 atmospheres achieving a diameter of just over 1.6 mm where it was maintained for approximately a minute. The balloon was then deflated and retrieved proximally. A control arteriogram performed through the intermediary catheter in the left internal carotid artery demonstrated again significantly improved caliber and flow through the angioplastied segment of the left middle cerebral artery. A TICI 2b revascularization was established. A control arteriogram performed approximately 5 minutes  after this again demonstrated complete occlusion of the left middle cerebral artery. At this time it was decided to stop as placement of the stent was not an option as per discussion above. The whole head arteriogram performed demonstrated no change in the previously noted robust collaterals from the anterior cerebral artery distribution. Throughout the procedure, the patient's blood pressure and neurological status remained stable. No angiographic evidence of extravasation or mass-effect or midline shift was noted. A CT of the head performed on the table demonstrated no gross mass effect, midline shift or of intracranial hemorrhage. Stability of the previously noted subdural hematoma was seen. The 8 [place] FlowGate guide catheter and the 8 French neurovascular sheath were then retrieved over a J-wire and replaced with an [place] sheath. This in turn was then removed with successful hemostasis with the an 8 [place] Angio-Seal device. Distal pulses remained Dopplerable in the dorsalis pedis, and posterior regions bilaterally. The patient's general anesthesia was then reversed and the patient was extubated. Upon extubation the patient was able to maintain adequate oxygenation. She was able to move to some degree her left-side spontaneously. No significant motion was noted in the right upper or right lower extremity. Her pupils were 2 mm and  equal. She was then transferred to the neuro ICU for further workup. IMPRESSION: Status post endovascular revascularization of occluded left middle cerebral artery with 2 passes with the 5 mm x 33 mm Embotrap retrieval device, and 1 pass with the 4 mm x 40 mm retrieval device, achieving a TICI 2b revascularization with subsequent reocclusion. Balloon angioplasty of occluded left middle cerebral artery probably due to underlying severe stenosis due to atherosclerosis with 2 passes again achieving a TICI 2b revascularization with subsequent reocclusion. PLAN: Follow-up outpatient clinic 4 weeks post discharge. Electronically Signed   By: Luanne Bras M.D.   On: 01/06/2019 13:41   Ir Percutaneous Art Thrombectomy/infusion Intracranial Inc Diag Angio  Result Date: 01/08/2019 INDICATION: New onset dysphagia, right-sided weakness and left gaze deviation. 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 EXAM: 1. EMERGENT LARGE VESSEL OCCLUSION THROMBOLYSIS (anterior CIRCULATION) 2. Intracranial balloon angioplasty of the left middle cerebral artery. COMPARISON:  CT angiogram of the head and neck of 01/05/2019. MEDICATIONS: Vancomycin 1 g IV antibiotic was administered within 1 hour of the procedure. ANESTHESIA/SEDATION: General anesthesia. CONTRAST:  Isovue 300 approximately 120 cc. FLUOROSCOPY TIME:  Fluoroscopy Time: 83 minutes 12 seconds (3019 mGy). COMPLICATIONS: None immediate. TECHNIQUE: Following a full explanation of the procedure along with the potential associated complications, an informed witnessed consent was obtained from the patient's daughter and husband. The risks of intracranial hemorrhage of 10%, worsening neurological deficit, ventilator dependency, death and inability to revascularize were all reviewed in detail with the patient's daughter and husband. The patient was then put under general anesthesia by the Department of Anesthesiology at [facility]. The right groin  was prepped and draped in the usual sterile fashion. Thereafter using modified Seldinger technique, transfemoral access into the right common femoral artery was obtained without difficulty. Over a 0.035 inch guidewire a 5 French Pinnacle sheath was inserted. Through this, and also over a 0.035 inch guidewire a 5 [place] JB 1 catheter was advanced to the aortic arch region and selectively positioned in the left common carotid artery. FINDINGS: The left common carotid arteriogram demonstrates the left external carotid artery and its major branches to be widely patent. The left internal carotid artery at the bulb to the cranial skull base demonstrates  wide patency with mild tortuosity proximally. The petrous, cavernous and supraclinoid segments are widely patent. The left middle cerebral artery demonstrates complete angiographic occlusion in the mid M1 segment at the origin of the anterior temporal branch. The left anterior cerebral artery is seen to opacify into the capillary and venous phases. The delayed arterial phase demonstrates retrograde opacification of the distal left MCA distribution from the pericallosal and callosal marginal branches. Retrograde opacification is noted into the M3 regions of the perisylvian branches. PROCEDURE: ENDOVASCULAR REVASCULARIZATION OF OCCLUDED LEFT MIDDLE CEREBRAL ARTERY M1 SEGMENT WITH MECHANICAL THROMBECTOMY, AND ALSO BALLOON ANGIOPLASTY FOR RECURRENT OCCLUSION. The diagnostic JB 1 catheter in the left common carotid artery was exchanged over a 0.035 inch 300 cm Rosen exchange guidewire for an 8 [place] 55 cm Brite tip neurovascular sheath using biplane roadmap technique and constant fluoroscopic guidance. Good aspiration was obtained from the side port of the neurovascular sheath. This was then connected to continuous heparinized saline infusion. Over the [employer] guidewire, an 8 [place] 85 cm balloon FlowGate guide catheter which had been prepped with 50% contrast and 50%  heparinized saline infusion was advanced and positioned in the mid cervical left ICA. The guidewire was removed. Good aspiration obtained from the hub of the [facility] guide catheter. A gentle control arteriogram performed through [facility] guide catheter in the left internal carotid artery demonstrated no evidence of spasms, dissections or of intraluminal filling defects. No change was seen in the left MCA occlusion. In a coaxial manner and with constant heparinized saline infusion using biplane roadmap technique and constant fluoroscopic guidance, a combination of a 6 [place] 132 cm Catalyst guide catheter inside of which was an 021 Trevo ProVue microcatheter was advanced over a 0.014 inch Softip Synchro micro guidewire to the distal end of the [facility] guide catheter in the left internal carotid artery. With the micro guidewire leading with a J-tip configuration, the combination was navigated to the supraclinoid left ICA. The occluded left middle cerebral artery was then crossed with the micro guidewire without difficulty into the inferior division M2 M3 region followed by the microcatheter. The guidewire was removed. Good aspiration was obtained from the hub of the microcatheter. This was then connected to continuous heparinized saline infusion. A 33 mm x 5 mm Embotrap retrieval device was then advanced into the distal end of the microcatheter. The proximal and the distal landing zones were then defined. The O ring on the delivery microcatheter was then loosened. With slight forward traction with the right hand on the delivery micro guidewire, with the left hand the delivery microcatheter was retrieved unsheathing the distal and then the proximal portion of the retrieval device. A gentle control arteriogram performed through Catalyst guide catheter in the supraclinoid left ICA demonstrated a revascularization. With proximal flow arrest in the left internal carotid artery by inflating the balloon of the [facility]  guide catheter, the combination of the retrieval device, the microcatheter and the 6 [place] Catalyst guide catheter were retrieved and removed as aspiration was continued as the balloon was deflated in the left internal carotid artery. Aspiration was performed with a 60 mL syringe at the hub of the [facility] guide catheter, and with a Prenumbra aspiration device the hub of the 6 [place] Catalyst guide catheter. Free back bleed of blood was noted at the hub of the [facility] guide catheter. A gentle control arteriogram performed through the [facility] guide catheter in the left internal carotid artery demonstrated no change in the occluded left middle cerebral artery. No  evidence of clots or debris was found in the retrieval device or the aspirate. Two more attempts at mechanical thrombectomy were then made. A second attempt was again using the combination as described above. Again after having established safe position of tip of the microcatheter and placement of the 5 mm x 33 mm Embotrap retrieval device, with proximal flow arrest in the left internal carotid, and aspiration at the hub of the [facility] guide catheter with a 60 mL syringe, and with a Penumbra aspiration catheter at the hub of the 6 [place] Catalyst guide catheter, again with proximal flow arrest, the combination was retrieved and removed without evidence of clots in the aspirate or the retrieval device. A control arteriogram performed following this continued to demonstrate occluded left middle cerebral artery. A third attempt was made with using a combination of a 7 [place] Catalyst guide catheter inside of which was an 021 Trevo ProVue microcatheter again advanced over a 0.014 inch Softip Synchro micro guidewire to the distal end of the supraclinoid left ICA. Again access was obtained with the micro guidewire into M2 M3 region of the dominant inferior division followed by the microcatheter. After having established safe position of the tip of the  microcatheter, and connected to heparinized saline infusion, a 4 mm x 40 mm Solitaire FR retrieval device was then advanced and deployed in a predetermined position. Again a control arteriogram performed through the Catalyst guide catheter in the supraclinoid left ICA continued to demonstrate a TICI 2b revascularization while the device was opened. Thereafter with proximal flow arrest, the combination of the retrieval device, the microcatheter and the 7 [place] Catalyst guide catheter was retrieved and removed as aspiration was applied with a 60 mL syringe at the hub of the [facility] guide catheter, and with a Penumbra device at the hub of the 7 [place] Catalyst guide catheter the combination was retrieved and removed. Back bleed was allowed after reversal of aspiration. At this time there were a few flecks of whitish material noted in the aspirate. A control arteriogram performed through the [facility] guide catheter in the left internal carotid artery demonstrated continued complete occlusion of the left middle cerebral artery unchanged. It was felt that the underlying pathology was probably arteriosclerotic disease with significant stenosis. Placement of a stent across the tight occlusion of the left middle cerebral artery was felt to be potentially riskier in terms of post reperfusion hemorrhage and also with the patient having to be given dual antiplatelets. With the patient already being on warfarin, and also a history of subdural hematoma overlying the cerebral convexity on the left side, it was felt to proceed with intracranial angioplasty. A Gateway 1.5 mm x 15 mm balloon was then prepped and purged with 50% contrast and 50% heparinized saline infusion. This was then advanced inside of an intermediary catheter to the supraclinoid left ICA over a 0.014 inch Softip Synchro micro guidewire. The micro guidewire was then advanced through the occluded left middle cerebral artery followed by the placement of the  balloon. An angioplasty was then performed with a micro inflation syringe device via micro tubing inflating the balloon to just over 6 atmospheres achieving a 1.6 mm diameter where a balloon was maintained for approximately a minute and a half. It was then deflated and retrieved slightly proximally with the wire maintained distally. A control arteriogram performed demonstrated now significantly improved caliber and flow through the angioplastied segment with a TICI 2b revascularization. An angiogram performed 3 minutes later demonstrated progressive worsening of  the angioplastied segment prompting a second angioplasty again with the balloon being inflated gradually to 6.1 atmospheres achieving a diameter of just over 1.6 mm where it was maintained for approximately a minute. The balloon was then deflated and retrieved proximally. A control arteriogram performed through the intermediary catheter in the left internal carotid artery demonstrated again significantly improved caliber and flow through the angioplastied segment of the left middle cerebral artery. A TICI 2b revascularization was established. A control arteriogram performed approximately 5 minutes after this again demonstrated complete occlusion of the left middle cerebral artery. At this time it was decided to stop as placement of the stent was not an option as per discussion above. The whole head arteriogram performed demonstrated no change in the previously noted robust collaterals from the anterior cerebral artery distribution. Throughout the procedure, the patient's blood pressure and neurological status remained stable. No angiographic evidence of extravasation or mass-effect or midline shift was noted. A CT of the head performed on the table demonstrated no gross mass effect, midline shift or of intracranial hemorrhage. Stability of the previously noted subdural hematoma was seen. The 8 [place] FlowGate guide catheter and the 8 French neurovascular  sheath were then retrieved over a J-wire and replaced with an [place] sheath. This in turn was then removed with successful hemostasis with the an 8 [place] Angio-Seal device. Distal pulses remained Dopplerable in the dorsalis pedis, and posterior regions bilaterally. The patient's general anesthesia was then reversed and the patient was extubated. Upon extubation the patient was able to maintain adequate oxygenation. She was able to move to some degree her left-side spontaneously. No significant motion was noted in the right upper or right lower extremity. Her pupils were 2 mm and equal. She was then transferred to the neuro ICU for further workup. IMPRESSION: Status post endovascular revascularization of occluded left middle cerebral artery with 2 passes with the 5 mm x 33 mm Embotrap retrieval device, and 1 pass with the 4 mm x 40 mm retrieval device, achieving a TICI 2b revascularization with subsequent reocclusion. Balloon angioplasty of occluded left middle cerebral artery probably due to underlying severe stenosis due to atherosclerosis with 2 passes again achieving a TICI 2b revascularization with subsequent reocclusion. PLAN: Follow-up outpatient clinic 4 weeks post discharge. Electronically Signed   By: Luanne Bras M.D.   On: 01/06/2019 13:41     ASSESSMENT Ms. COURTNE LIGHTY is a 83 y.o. female with history of diabetes, hyperlipidemia, hypothyroidism, breast cancer, proximal atrial fibrillation on warfarin who had a fall 2 weeks ago and again the morning of admission presenting with right facial droop and speech difficulty.   Stroke:  presumed left MCA infarct due to left M1 occlusion status pacemaker failed mechanical thrombectomy and balloon angioplasty. Stent not deployed due to concurrent subdural hematoma and increased risk of hemorrhage.  Stroke etiology likely due to A. fib on Coumadin with subtherapeutic INR.  Resultant left gaze preference, right hemianopia, right  neglect, right hemiplegia, global aphasia  CT head broad-based left convexity SDH. Thin left tentorial SDH.  Tubular hypodensity L MCA M1 possible thrombus.   CTA head & neck ELVO L M1 thrombus.  Mixed density SDH left cerebral convexity.  IR with endovascular revascularization and angioplasty x2 with continued TICI2b revascularization with reocclusion.   Repeat CT head 01/07/19 no acute abnormality   MRI and MRA pending  2D Echo  EF 60-65%. No source of embolus   EEG x 2 no seizure   LDL 35  HgbA1c 6.9  Heparin subq for VTE prophylaxis  warfarin daily prior to admission, now on aspirin 81  Therapy recommendations:  pending   Disposition:  pending   SDH  CT head 01/05/2019 showed broad-based left convexity SDH and seen left tentorial SDH  Fell 2 weeks ago just prior to pacer surgery per son had bruise on her head  [place] again prior to admission  SDH likely acute on chronic  Repeat CT head 01/07/2019 showed improving left convexity SDH  Continue monitoring  Lethargy with mild SOB  CXR unremarkable  UA pending  Afebrile - Tmax 99.3   Close monitoring  If not improving, may consider ABG or CTA chest PE protocol.  Atrial Fibrillation  Home anticoagulation:  warfarin daily   INR on admission 1.59  Warfarin on hold given SDH  Home meds: metoprolol 50 bid   slightly elevated troponin likely d/t AF - 0.03.  Now on aspirin 81  Status post pacemaker  Done on 12/23/2018 in [place] device  Likely MRI compatible  MRI and MRA pending  Hypertension   No previous dx  Home meds: metoprolol, lasix  On cleviprex post IR - BP goal per IR 24h post procedure  Echo normal EF  Hyperlipidemia  Home meds:  lipitor 20 and lovaza  LDL 35, goal < 70  On Lipitor 10  Resume home at discharge  Diabetes type II  HgbA1c 6.9, at goal < 7.0  Controlled  SSI  CBG monitoring  Other Stroke Risk Factors  Advanced age  Other Active  Problems  Small layering right pleural effusion seen on chest x-ray  Hx L breast cancer  Hospital day # 5  I spent  35 minutes in total face-to-face time with the patient, more than 50% of which was spent in counseling and coordination of care, reviewing test results, images and medication, and discussing the diagnosis of worsening mental status, SOB, lethargy, treatment plan and potential prognosis. This patient's care requiresreview of multiple databases, neurological assessment, discussion with family, other specialists and medical decision making of high complexity.   Rosalin Hawking, MD PhD Stroke Neurology 01/10/2019 12:43 PM     To contact Stroke Continuity provider, please refer to [email or web address] After hours, contact General Neurology

## 2019-01-11 ENCOUNTER — Inpatient Hospital Stay (HOSPITAL_COMMUNITY): Payer: Medicare Other

## 2019-01-11 LAB — BASIC METABOLIC PANEL
Anion gap: 11 (ref 5–15)
BUN: 24 mg/dL — ABNORMAL HIGH (ref 8–23)
CALCIUM: 8.8 mg/dL — AB (ref 8.9–10.3)
CO2: 25 mmol/L (ref 22–32)
Chloride: 108 mmol/L (ref 98–111)
Creatinine, Ser: 0.76 mg/dL (ref 0.44–1.00)
GFR calc non Af Amer: >60 mL/min (ref >60)
Glucose, Bld: 267 mg/dL — ABNORMAL HIGH (ref 70–99)
Potassium: 3.5 mmol/L (ref 3.5–5.1)
Sodium: 144 mmol/L (ref 135–145)

## 2019-01-11 LAB — GLUCOSE, CAPILLARY
Glucose-Capillary: 174 mg/dL — ABNORMAL HIGH (ref 70–99)
Glucose-Capillary: 231 mg/dL — ABNORMAL HIGH (ref 70–99)
Glucose-Capillary: 237 mg/dL — ABNORMAL HIGH (ref 70–99)
Glucose-Capillary: 244 mg/dL — ABNORMAL HIGH (ref 70–99)
Glucose-Capillary: 247 mg/dL — ABNORMAL HIGH (ref 70–99)
Glucose-Capillary: 263 mg/dL — ABNORMAL HIGH (ref 70–99)
Glucose-Capillary: 264 mg/dL — ABNORMAL HIGH (ref 70–99)
Glucose-Capillary: 309 mg/dL — ABNORMAL HIGH (ref 70–99)

## 2019-01-11 LAB — CBC
HCT: 31 % — ABNORMAL LOW (ref 36.0–46.0)
Hemoglobin: 9.7 g/dL — ABNORMAL LOW (ref 12.0–15.0)
MCH: 30.8 pg (ref 26.0–34.0)
MCHC: 31.3 g/dL (ref 30.0–36.0)
MCV: 98.4 fL (ref 80.0–100.0)
Platelets: 252 10*3/uL (ref 150–400)
RBC: 3.15 MIL/uL — ABNORMAL LOW (ref 3.87–5.11)
RDW: 15.9 % — ABNORMAL HIGH (ref 11.5–15.5)
WBC: 10.3 10*3/uL (ref 4.0–10.5)
nRBC: 0.5 % — ABNORMAL HIGH (ref 0.0–0.2)

## 2019-01-11 MED ORDER — FUROSEMIDE 10 MG/ML IJ SOLN
20.0000 mg | Freq: Two times a day (BID) | INTRAMUSCULAR | Status: AC
  Administered 2019-01-11 – 2019-01-12 (×2): 20 mg via INTRAVENOUS
  Filled 2019-01-11 (×2): qty 4

## 2019-01-11 MED ORDER — POTASSIUM CHLORIDE 20 MEQ/15ML (10%) PO SOLN
20.0000 meq | Freq: Two times a day (BID) | ORAL | Status: AC
  Administered 2019-01-11 (×2): 20 meq
  Filled 2019-01-11 (×2): qty 15

## 2019-01-11 MED ORDER — INSULIN GLARGINE 100 UNIT/ML ~~LOC~~ SOLN
35.0000 [IU] | Freq: Two times a day (BID) | SUBCUTANEOUS | Status: DC
  Administered 2019-01-11 – 2019-01-14 (×6): 35 [IU] via SUBCUTANEOUS
  Filled 2019-01-11 (×6): qty 0.35

## 2019-01-11 MED ORDER — SODIUM CHLORIDE 0.9 % IV SOLN
3.0000 g | Freq: Three times a day (TID) | INTRAVENOUS | Status: DC
  Administered 2019-01-11 – 2019-01-14 (×9): 3 g via INTRAVENOUS
  Filled 2019-01-11 (×10): qty 3

## 2019-01-11 NOTE — Progress Notes (Signed)
Physical Therapy Treatment Patient Details Name: Stacy [name] MRN: [id number] DOB: Jan 09, 1936 Today's Date: 01/11/2019    History of Present Illness Pt is an 83 y/o female admitted after fall. Found to have R facial droop and aphasia as well. Imaging revealed L MCA infarct, and L convexity subdural hematoma. Pt is s/p thrombectomy X3 and balloon angioplasty and had subsequent reocclusion. Repeat CT 1/27 revealed worsening hypoattenuation within the left corona radiata, consistent with evolving subacute infarct. PMH includes DM, breast cancer, a fib.     PT Comments    Patient progressing towards goals very slowly. Patient required maxA +2 for all bed mobility with increased alertness/arousal upon sitting EOB. Patient follows simple one step commands inconsistently. Patient briefly squeezed therapist hand and performed visual tracking to midline x1 but unsure if movement is spontaneous or purposeful as patient unable to repeat second time. Patient will continue to benefit from acute physical therapy to maximize independence and safety with functional mobility.    Follow Up Recommendations  CIR;Supervision/Assistance - 24 hour     Equipment Recommendations  None recommended by PT    Recommendations for Other Services       Precautions / Restrictions Precautions Precautions: Fall Restrictions Weight Bearing Restrictions: No    Mobility  Bed Mobility Overal bed mobility: Needs Assistance Bed Mobility: Supine to Sit;Sit to Supine     Supine to sit: Max assist;+2 for physical assistance Sit to supine: Max assist;+2 for physical assistance   General bed mobility comments: Patient required maxA +2 for all bed mobility. Patient required multimodal cues for sequencing. Patient with increased alertness/arousal upon sitting EOB. Patient followed simple one step commands inconsistently while sitting EOB. Able to briefly track towards midline x1 with verbal and visual cues. Unsure if  purposeful or spontaneous as patient did not track on second attempt. Patient fatigues very quickly.  Transfers                    Ambulation/Gait                 Stairs             Wheelchair Mobility    Modified Rankin (Stroke Patients Only) Modified Rankin (Stroke Patients Only) Pre-Morbid Rankin Score: No significant disability Modified Rankin: Severe disability     Balance Overall balance assessment: Needs assistance Sitting-balance support: Single extremity supported;Feet supported Sitting balance-Leahy Scale: Poor Sitting balance - Comments: Required mod-maxA for sitting balance assist. Patient with anterior weight shift that appeared more spontaneous than purposeful.                                     Cognition Arousal/Alertness: Lethargic Behavior During Therapy: Flat affect Overall Cognitive Status: Difficult to assess                                 General Comments: Difficult to assess secondary to global aphasia      Exercises      General Comments General comments (skin integrity, edema, etc.): Patient with severe R side neglect. Able to track therapist briefly towards midline when initally sitting EOB. Patient follows one step commands inconsistently. Patient with slight LLE leg movement when asked to kick out however unable to repeat second time. Patient able to squeeze with L hand lightly but could be more spontaneous movement  vs purposeful as patient unable to repeat movement second time. Patient with 1 episode of blank stare/gaze during session which resolved within approximately 5-10seconds.       Pertinent Vitals/Pain Pain Assessment: Faces Faces Pain Scale: Hurts little more Pain Location: generalized Pain Intervention(s): Monitored during session;Repositioned    Home Living                      Prior Function            PT Goals (current goals can now be found in the care plan  section) Acute Rehab PT Goals Patient Stated Goal: for patient to regain independence per family PT Goal Formulation: With family Time For Goal Achievement: 01/22/19 Potential to Achieve Goals: Fair Progress towards PT goals: Progressing toward goals    Frequency    Min 4X/week      PT Plan Current plan remains appropriate    Co-evaluation              AM-PAC PT "6 Clicks" Mobility   Outcome Measure  Help needed turning from your back to your side while in a flat bed without using bedrails?: A Lot Help needed moving from lying on your back to sitting on the side of a flat bed without using bedrails?: A Lot Help needed moving to and from a bed to a chair (including a wheelchair)?: A Lot Help needed standing up from a chair using your arms (e.g., wheelchair or bedside chair)?: A Lot Help needed to walk in hospital room?: Total Help needed climbing 3-5 steps with a railing? : Total 6 Click Score: 10    End of Session   Activity Tolerance: Patient limited by fatigue Patient left: in bed;with call bell/phone within reach;with bed alarm set;with family/visitor present Nurse Communication: Mobility status PT Visit Diagnosis: Muscle weakness (generalized) (M62.81);Difficulty in walking, not elsewhere classified (R26.2);Hemiplegia and hemiparesis Hemiplegia - Right/Left: Right Hemiplegia - caused by: Cerebral infarction     Time: 7672-0947 PT Time Calculation (min) (ACUTE ONLY): 20 min  Charges:  $Neuromuscular Re-education: 8-22 mins                     Erick Blinks, SPT  Erick Blinks 01/11/2019, 3:47 PM

## 2019-01-11 NOTE — Progress Notes (Signed)
  Speech Language Pathology Treatment: Dysphagia;Cognitive-Linquistic  Patient Details Name: Stacy [name] MRN: [id number] DOB: December 03, 1936 Today's Date: 01/11/2019 Time: 7948-0165 SLP Time Calculation (min) (ACUTE ONLY): 14 min  Assessment / Plan / Recommendation Clinical Impression  Pt's head was turned completely to left side on arrival due to visual neglect; therapist manually brought to midline however she was unable to maintain position. She focused attention and opened eyes twice during session (not on command). SLP utilized tactile and auditory stimuli to elicit purposeful response without success. Dried, crusty debris present on left side lips/face indicative of poor secretion management removed via oral care; unable to access inside oral cavity. Ice chip presentation was held without attempts to manipulate and melted without pharyngeal reflexive swallow.   Vocalizations consisted of moaning in response to repositioning of head. Poor ability to sustain attention to functional activity or speaker impacts pt's interaction with environment at present time. ST to continue focusing on pt's alertness, attention, basic communication needs and swallow response.    HPI HPI: Pt is an 83 y.o. female with DM, HLD, hypothyroidism, breast cancer, paroxysmal atrial fibrillation anticoagulated on warfarin, who was admitted following a fall at home. Pt initially conversive with right facial droop and speech difficulty.  CT of the head of 1/21 revealed complex broad-base left convexity subdural hematoma. Largest component appears hypodense (possibly chronic) measuring 10.1 mm maximal thickness. More acute appearing component (which is slightly hyperdense) measures up to 4.3 mm. Mild local mass effect upon adjacent frontal-parietal gyri. Minimal bowing of the septum to the right. Repeat CT 1/27 revealed Worsening hypoattenuation within the left corona radiata, consistent with evolving subacute infarct.        SLP Plan  Continue with current plan of care       Recommendations  Diet recommendations: NPO Medication Administration: Via alternative means                Oral Care Recommendations: Oral care QID Follow up Recommendations: 24 hour supervision/assistance;Skilled Nursing facility SLP Visit Diagnosis: Dysphagia, oropharyngeal phase (R13.12) Plan: Continue with current plan of care       GO                Houston Siren 01/11/2019, 8:55 AM  Orbie Pyo Colvin Caroli.Ed Risk analyst [phone] Office [phone]

## 2019-01-11 NOTE — Progress Notes (Signed)
PROGRESS NOTE    Stacy [name]  CLE:[id number]  DOB: Feb 21, 1936  DOA: 01/05/2019 PCP: Rusty Aus, MD  Brief Narrative:   83 year old female with history of hypertension hyperlipidemia, Diabetes mellitus, hypothyroidism, breast cancer,A. fib on chronic anticoagulation with warfarin who had a fall 2 weeks back as well as on the day of admission presented to this [facility] on January 21 with right facial droop and aphasia.  Patient felt to have left MCA stroke due to left MI occlusion.  Seen by neurology.  She failed mechanical thrombectomy and balloon angioplasty attempt, stent not deployed due to concurrent subdural hematoma and increased risk of hemorrhage.  Neurology has been following this patient closely.  Anticoagulation has been on hold due to subdural hematoma.  Imaging studies of the head have not shown brain infarcts on CT scan x3.  Repeat EEG, negative for epileptiform activities  Subjective: -Temperature was in the 99 range yesterday, afebrile this morning  Assessment & Plan:   1.  Acute left MCA stroke, due to M1 occlusion -Status post failed mechanical thrombectomy x2 and balloon angioplasty -CTA head & neck ELVO L M1 thrombus.  Mixed density SDH left cerebral convexity. Cerebral angiogram endovascular TICI2b revascularization L M1 occlusion with 2 passes embotrap, 1 pass Solitaire.  Angioplasty x2 with continued TICI2b revascularization with reocclusion.  -Admission CT and repeat CT head noted subdural hematoma -Stent could not be deployed due to concurrent subdural hematoma of moderate size and increased risk of hemorrhage if antiplatelets were needed -Unable to have an MRI due to pacemaker, stroke not noted on repeat CTs -EEG negative for epileptiform activity x2 -Per stroke team, this was likely an embolic stroke from A. fib, subtherapeutic INR -Now with residual dense right hemiplegia, global aphasia and severe dysphagia -2D echocardiogram with preserved EF,  no cardiac source of embolus -LDL was 35, hemoglobin A1c 6.9 -Discussed overall prognosis with husband and granddaughter 1/24, she was started on core track and tube feeds  -Reassess swallowing in 1 to 2 days, will likely need palliative care/goals of care meeting if no considerable improvement  -remains off anticoagulation due to subdural hematoma, continue statin -Started on aspirin 81 mg p.o. tube  -PT/OT eval ongoing  3. Aspiration pneumonia  -With mild worsening lethargy and work of breathing -Right lower lobe infiltrate noted on repeat x-ray today, given worsening lethargy, high aspiration risk, low-grade temps last evening -start IV Unasyn -Also give single dose of Lasix  4.  Left subdural hematoma: In the setting of falls/chronic anticoagulation.  Maximum thickness at 10 mm with mild local mass-effect. -Coumadin discontinued  5.  Chronic atrial fibrillation:  -restart Toprol at a lower dose, anticoagulation on hold as above  6.  Diabetes mellitus type 2:  -Hemoglobin A1c at 6.9.   -CBG is elevated after starting tube feeds, started Lantus yesterday, will increase dose  7.  History of left breast cancer:  8. Elevated LFTs: monitor on statins   DVT prophylaxis: SCD Code Status: DNR Family / Patient Communication: Discussed with husband bedside  yesterday Disposition Plan: To be determined  Objective: Vitals:   01/11/19 0451 01/11/19 0939 01/11/19 1005 01/11/19 1242  BP: (!) 144/96 139/82 138/83 (!) 131/93  Pulse: 95 94 81 83  Resp: 19 15 18 20   Temp: 98.8 F (37.1 C) 99.2 F (37.3 C) (!) 97.5 F (36.4 C) 98.8 F (37.1 C)  TempSrc: Oral Oral Oral Oral  SpO2:  100% 100% 98%  Weight:      Height:  Intake/Output Summary (Last 24 hours) at 01/11/2019 1255 Last data filed at 01/11/2019 1000 Gross per 24 hour  Intake 0 ml  Output 0 ml  Net 0 ml   Filed Weights   01/06/19 1400 01/09/19 0409  Weight: 71.1 kg 72.9 kg    Physical Examination: Gen:  Chronically ill appearing female, more alert today, global aphasia, left gaze preference HEENT: PERRLA, Neck supple, no JVD Lungs: Decreased breath sounds at both bases, few scattered rhonchi CVS: RRR,No Gallops,Rubs or new Murmurs Abd: soft, Non tender, non distended, BS present Extremities: Trace edema Skin: no new rashes Neuro: Global aphasia, dense right hemiplegia  Data Reviewed: I have personally reviewed following labs and imaging studies  CBC: Recent Labs  Lab 01/05/19 0559 01/06/19 0510 01/07/19 0454 01/08/19 0639 01/09/19 0407 01/10/19 1001 01/11/19 0804  WBC 8.6 13.3* 11.4* 8.7 8.8 11.1* 10.3  NEUTROABS 6.8 10.0*  --   --   --   --   --   HGB 10.1* 8.0* 8.3* 8.0* 8.5* 9.5* 9.7*  HCT 32.6* 25.7* 26.8* 25.6* 27.3* 30.7* 31.0*  MCV 97.3 98.8 100.0 98.1 96.8 97.8 98.4  PLT 194 233 224 228 242 265 952   Basic Metabolic Panel: Recent Labs  Lab 01/07/19 0454 01/08/19 0639 01/09/19 0407 01/10/19 1001 01/11/19 0517  NA 141 143 142 144 144  K 3.7 2.9* 3.3* 3.7 3.5  CL 112* 114* 111 110 108  CO2 16* 18* 22 26 25   GLUCOSE 177* 155* 260* 267* 267*  BUN 20 16 16 20  24*  CREATININE 1.01* 0.96 0.66 0.72 0.76  CALCIUM 8.3* 8.4* 8.7* 8.9 8.8*   GFR: Estimated Creatinine Clearance: 56.1 mL/min (by C-G formula based on SCr of 0.76 mg/dL). Liver Function Tests: Recent Labs  Lab 01/05/19 0559  AST 47*  ALT 175*  ALKPHOS 51  BILITOT 1.4*  PROT 6.0*  ALBUMIN 3.2*   No results for input(s): LIPASE, AMYLASE in the last 168 hours. Recent Labs  Lab 01/07/19 0943  AMMONIA 14   Coagulation Profile: Recent Labs  Lab 01/05/19 0559  INR 1.59   Cardiac Enzymes: Recent Labs  Lab 01/05/19 0559  TROPONINI 0.03*   BNP (last 3 results) No results for input(s): PROBNP in the last 8760 hours. HbA1C: No results for input(s): HGBA1C in the last 72 hours. CBG: Recent Labs  Lab 01/10/19 1954 01/10/19 2359 01/11/19 0411 01/11/19 0804 01/11/19 1122  GLUCAP 260*  309* 244* 231* 247*   Lipid Profile: No results for input(s): CHOL, HDL, LDLCALC, TRIG, CHOLHDL, LDLDIRECT in the last 72 hours. Thyroid Function Tests: No results for input(s): TSH, T4TOTAL, FREET4, T3FREE, THYROIDAB in the last 72 hours. Anemia Panel: No results for input(s): VITAMINB12, FOLATE, FERRITIN, TIBC, IRON, RETICCTPCT in the last 72 hours. Sepsis Labs: No results for input(s): PROCALCITON, LATICACIDVEN in the last 168 hours.  Recent Results (from the past 240 hour(s))  MRSA PCR Screening     Status: None   Collection Time: 01/05/19  1:03 PM  Result Value Ref Range Status   MRSA by PCR NEGATIVE NEGATIVE Final    Comment:        The GeneXpert MRSA Assay (FDA approved for NASAL specimens only), is one component of a comprehensive MRSA colonization surveillance program. It is not intended to diagnose MRSA infection nor to guide or monitor treatment for MRSA infections. Performed at [facility], [place] [address], [place], [place] [address]       Radiology Studies: Dg Chest 2 View  Result Date: 01/11/2019 CLINICAL DATA:  83 year old female with hypoxia. History of stroke. Subsequent encounter. EXAM: CHEST - 2 VIEW COMPARISON:  01/10/2019 and 01/05/2019 chest x-ray FINDINGS: As best appreciated on lateral view, increase consolidation involving the lung bases greater on the right. This may represent pleural fluid with basilar atelectasis although lower lobe infiltrate, particularly on the right, can not be excluded. Mild pulmonary vascular congestion/mild pulmonary edema. Cardiomegaly with sequential pacemaker in place. Post left breast surgery and axillary lymph node dissection. Feeding tube in place, tip not imaged on frontal view. Calcified aorta. IMPRESSION: 1. Increased consolidation involving lung bases greater on right. This may represent pleural fluid with atelectasis although lower lobe infiltrate, particularly on the right, could not be excluded in proper clinical  setting. 2. Pulmonary vascular congestion/mild pulmonary edema. 3. Cardiomegaly with pacemaker in place 4.  Aortic Atherosclerosis (ICD10-I70.0). Electronically Signed   By: Genia Del M.D.   On: 01/11/2019 08:01   Ct Head Wo Contrast  Result Date: 01/11/2019 CLINICAL DATA:  Stroke follow-up EXAM: CT HEAD WITHOUT CONTRAST TECHNIQUE: Contiguous axial images were obtained from the base of the skull through the vertex without intravenous contrast. COMPARISON:  01/07/2019 FINDINGS: Brain: Low-density left subdural hematoma is unchanged in size and appearance. There is periventricular hypoattenuation compatible with chronic microvascular disease. There is progressive hypoattenuation within the left corona radiata. No intraparenchymal hemorrhage. No midline shift or other mass effect. Vascular: No abnormal hyperdensity of the major intracranial arteries or dural venous sinuses. No intracranial atherosclerosis. Skull: The visualized skull base, calvarium and extracranial soft tissues are normal. Sinuses/Orbits: No fluid levels or advanced mucosal thickening of the visualized paranasal sinuses. No mastoid or middle ear effusion. The orbits are normal. IMPRESSION: 1. Unchanged size and appearance of low-density left subdural hematoma. 2. Worsening hypoattenuation within the left corona radiata, consistent with evolving subacute infarct. 3. No midline shift or other mass effect. Electronically Signed   By: Ulyses Jarred M.D.   On: 01/11/2019 01:14   Dg Chest Port 1 View  Result Date: 01/10/2019 CLINICAL DATA:  Left middle cerebral artery thrombosis. Labored breathing this morning. EXAM: PORTABLE CHEST 1 VIEW COMPARISON:  01/05/2019 FINDINGS: Stable heart size and appearance of pacemaker. Feeding tube extends into the expected region of the proximal duodenum. Lungs show minimal bibasilar atelectasis. There is no evidence of pulmonary edema, consolidation, pneumothorax, nodule or pleural fluid. IMPRESSION: No acute  findings with minimal bibasilar atelectasis present. Electronically Signed   By: Aletta Edouard M.D.   On: 01/10/2019 09:59        Scheduled Meds: . aspirin  81 mg Per Tube Daily  . atorvastatin  10 mg Per Tube q1800  . chlorhexidine  15 mL Mouth Rinse BID  . fluticasone  2 spray Each Nare Daily  . furosemide  20 mg Intravenous BID  . gabapentin  100 mg Per Tube BID  . heparin injection (subcutaneous)  5,000 Units Subcutaneous Q8H  . insulin aspart  0-15 Units Subcutaneous Q4H  . insulin glargine  25 Units Subcutaneous BID  . iopamidol  100 mL Intravenous Once  . levothyroxine  75 mcg Per Tube Q0600  . magnesium oxide  400 mg Per Tube Daily  . mouth rinse  15 mL Mouth Rinse BID  . metoprolol tartrate  25 mg Per Tube BID  . pantoprazole sodium  40 mg Per Tube Daily  . potassium chloride  20 mEq Per Tube BID   Continuous Infusions: . ampicillin-sulbactam (UNASYN) IV    .  feeding supplement (JEVITY 1.2 CAL) 60 mL/hr at 01/09/19 1200      LOS: 6 days    Time spent: 31min    Domenic Polite, MD  01/11/2019, 12:55 PM

## 2019-01-11 NOTE — Progress Notes (Signed)
Pharmacy Antibiotic Note  Stacy [name] is a 83 y.o. female admitted on 01/05/2019 with acute MCA stroke now with concern for aspiration PNA .  Pharmacy has been consulted for Unasyn dosing.  SCr 0.76, CrCl ~ 56 mL/min. Plan: Unasyn 3g IV every 8 hours Monitor renal function, clinical progression and LOT  Height: 5\' 5"  (165.1 cm) Weight: 160 lb 11.5 oz (72.9 kg) IBW/kg (Calculated) : 57  Temp (24hrs), Avg:98.6 F (37 C), Min:97.5 F (36.4 C), Max:99.8 F (37.7 C)  Recent Labs  Lab 01/07/19 0454 01/08/19 0639 01/09/19 0407 01/10/19 1001 01/11/19 0517 01/11/19 0804  WBC 11.4* 8.7 8.8 11.1*  --  10.3  CREATININE 1.01* 0.96 0.66 0.72 0.76  --     Estimated Creatinine Clearance: 56.1 mL/min (by C-G formula based on SCr of 0.76 mg/dL).    Allergies  Allergen Reactions  . Morphine And Related Anaphylaxis  . Amiodarone Other (See Comments)    tremors  . Codeine Other (See Comments)    Hallucinations   . Lovaza [Omega-3-Acid Ethyl Esters] Diarrhea  . Zaroxolyn [Metolazone] Other (See Comments)  . Ivp Dye [Iodinated Diagnostic Agents] Rash  . Penicillins Rash    Has patient had a PCN reaction causing immediate rash, facial/tongue/throat swelling, SOB or lightheadedness with hypotension: Yes Has patient had a PCN reaction causing severe rash involving mucus membranes or skin necrosis: No Has patient had a PCN reaction that required hospitalization: No Has patient had a PCN reaction occurring within the last 10 years: No If all of the above answers are "NO", then may proceed with Cephalosporin use.  . Sulfa Antibiotics Rash    Antimicrobials this admission: Unasyn 1/27>>  Dose adjustments this admission: n/a  Microbiology results: MRSA PCR negative  Bertis Ruddy, PharmD Clinical Pharmacist Please check AMION for all [place] numbers 01/11/2019 11:14 AM

## 2019-01-11 NOTE — Care Management Important Message (Signed)
Important Message  Patient Details  Name: Stacy [name] MRN: [id number] Date of Birth: 1936-03-25   Medicare Important Message Given:  Yes    Tommy Medal 01/11/2019, 4:20 PM

## 2019-01-11 NOTE — Progress Notes (Addendum)
STROKE TEAM PROGRESS NOTE   SUBJECTIVE Lying in bed. Friend visiting pt and husband. He left during rounds. Granddaughter also present. Discussed plan of care with her  OBJECTIVE Vitals:   01/11/19 1005 01/11/19 1242  BP: 138/83 (!) 131/93  Pulse: 81 83  Resp: 18 20  Temp: (!) 97.5 F (36.4 C) 98.8 F (37.1 C)  SpO2: 100% 98%    Physical Exam - no change in exam Frail elderly Caucasian lady currently not in distress. Afebrile. Head is nontraumatic. Neck is supple without bruit.    Cardiac exam no murmur or gallop. Lungs are congested bilaterally. Distal pulses are well felt. Neurological Exam Lethargic, barely open eyes with painful stimuli. Globally aphasic. Not following any commands. Left gaze preference and did not look to right past midline today. Pupils 3 mm sluggishly reactive. Fundi not visualized.  Right hemianopia and neglect.  Significant right facial droop. Tongue midline in mouth. Weak movements on the left side with pain. Dense right hemiplegia with only trace withdrawal to nail bed pressure in the right arm and leg. Flaccid hypotonia in the right and normal tone on the left. Right plantar upgoing left downgoing. Sensation, coordination and gait not tested.  Pertinent Laboratory Studies/Diagnostics Recent Labs    01/09/19 0407 01/10/19 1001 01/11/19 0517 01/11/19 0804  WBC 8.8 11.1*  --  10.3  HGB 8.5* 9.5*  --  9.7*  PLT 242 265  --  252  NA 142 144 144  --   K 3.3* 3.7 3.5  --   CREATININE 0.66 0.72 0.76  --   GLUCOSE 260* 267* 267*  --     Imaging past 24h Dg Chest 2 View  Result Date: 01/11/2019 CLINICAL DATA:  83 year old female with hypoxia. History of stroke. Subsequent encounter. EXAM: CHEST - 2 VIEW COMPARISON:  01/10/2019 and 01/05/2019 chest x-ray FINDINGS: As best appreciated on lateral view, increase consolidation involving the lung bases greater on the right. This may represent pleural fluid with basilar atelectasis although lower lobe infiltrate,  particularly on the right, can not be excluded. Mild pulmonary vascular congestion/mild pulmonary edema. Cardiomegaly with sequential pacemaker in place. Post left breast surgery and axillary lymph node dissection. Feeding tube in place, tip not imaged on frontal view. Calcified aorta. IMPRESSION: 1. Increased consolidation involving lung bases greater on right. This may represent pleural fluid with atelectasis although lower lobe infiltrate, particularly on the right, could not be excluded in proper clinical setting. 2. Pulmonary vascular congestion/mild pulmonary edema. 3. Cardiomegaly with pacemaker in place 4.  Aortic Atherosclerosis (ICD10-I70.0). Electronically Signed   By: Genia Del M.D.   On: 01/11/2019 08:01   Ct Head Wo Contrast  Result Date: 01/11/2019 CLINICAL DATA:  Stroke follow-up EXAM: CT HEAD WITHOUT CONTRAST TECHNIQUE: Contiguous axial images were obtained from the base of the skull through the vertex without intravenous contrast. COMPARISON:  01/07/2019 FINDINGS: Brain: Low-density left subdural hematoma is unchanged in size and appearance. There is periventricular hypoattenuation compatible with chronic microvascular disease. There is progressive hypoattenuation within the left corona radiata. No intraparenchymal hemorrhage. No midline shift or other mass effect. Vascular: No abnormal hyperdensity of the major intracranial arteries or dural venous sinuses. No intracranial atherosclerosis. Skull: The visualized skull base, calvarium and extracranial soft tissues are normal. Sinuses/Orbits: No fluid levels or advanced mucosal thickening of the visualized paranasal sinuses. No mastoid or middle ear effusion. The orbits are normal. IMPRESSION: 1. Unchanged size and appearance of low-density left subdural hematoma. 2. Worsening hypoattenuation within the  left corona radiata, consistent with evolving subacute infarct. 3. No midline shift or other mass effect. Electronically Signed   By: Ulyses Jarred M.D.   On: 01/11/2019 01:14      ASSESSMENT Stacy [name] is a 83 y.o. female with history of diabetes, hyperlipidemia, hypothyroidism, breast cancer, proximal atrial fibrillation on warfarin who had a fall 2 weeks ago and again the morning of admission presenting with right facial droop and speech difficulty.   Stroke:   left corona radiata infarct due to left M1 occlusion following pacemaker placement 12/23/2018 with failed mechanical thrombectomy and balloon angioplasty. Stent not deployed due to concurrent subdural hematoma and increased risk of hemorrhage.  Stroke etiology likely due to A. fib on Coumadin with subtherapeutic INR.  Resultant left gaze preference, right hemianopia, right neglect, right hemiplegia, global aphasia  CT head broad-based left convexity SDH. Thin left tentorial SDH.  Tubular hypodensity L MCA M1 possible thrombus.   CTA head & neck ELVO L M1 thrombus.  Mixed density SDH left cerebral convexity.  IR with endovascular revascularization and angioplasty x2 with continued TICI2b revascularization with reocclusion.   Repeat CT head 01/07/19 no acute abnormality   MRI and MRA - unable to complete d/t recently placed pacer - [employer] recommends waiting at least 6 weeks prior to MRI  Repeat CT head 1/27 now shows evolving left corona radiata infarct consistent with evolving subacute infarct.  Unchanged left subdural hematoma.  2D Echo  EF 60-65%. No source of embolus   EEG x 2 no seizure   LDL 35  HgbA1c 6.9  Heparin subq for VTE prophylaxis  warfarin daily prior to admission, now on aspirin 81  Therapy recommendations:  pending   Disposition:  pending   Likely palliative care / goals of care meeting in 1 to 2 days if no improvement   SDH  CT head 01/05/2019 showed broad-based left convexity SDH and seen left tentorial SDH  Fell 2 weeks ago just prior to pacer surgery per son had bruise on her head  [place] again prior to admission  SDH  likely acute on chronic  Repeat CT head 01/07/2019 showed improving left convexity SDH  Continue monitoring  Aspiration pneumonia /fluid overload  TM 99.8 past 24h  Repeat CXR with right lower lobe infiltrate  UA trace LE, rare bacteria  Started on IV Unasyn  Received single dose of Lasix  Attending managing  Atrial Fibrillation, chronic  Home anticoagulation:  warfarin daily   INR on admission 1.59  Warfarin on hold given SDH  Home meds: metoprolol 50 bid   slightly elevated troponin likely d/t AF - 0.03.  Now on aspirin 81  Status post pacemaker  Done on 12/23/2018 in [facility]  [employer] device  MRI compatible but not recommended to have MRI within 6 weeks of placement  Hypertension   No previous dx  Home meds: metoprolol, lasix  On cleviprex post IR - BP goal per IR 24h post procedure  Echo normal EF  Hyperlipidemia  Home meds:  lipitor 20 and lovaza  LDL 35, goal < 70  On Lipitor 10  Continue lower dose at discharge  Diabetes type II  HgbA1c 6.9, at goal < 7.0  Controlled  SSI  CBG monitoring  Other Stroke Risk Factors  Advanced age  Other Active Problems  Small layering right pleural effusion seen on chest x-ray  Hx L breast cancer   NOTHING FURTHER TO ADD FROM THE STROKE STANDPOINT  Poor neuro prognosis based  on past 6 days hospitalization  Patient has a 10-15% risk of having another stroke over the next year, the highest risk is within 2 weeks of the most recent stroke/TIA (risk of having a stroke following a stroke or TIA is the same).  Ongoing risk factor control by Primary Care Physician  Agree with palliative consult if no improvement in 1-2 days  Stroke Service will sign off. Please call should any needs arise.  Follow-up Stroke Clinic at [facility] in 4 weeks, based on plan of care at d/c  Hospital day # [place], MSN, APRN, ANVP-BC, AGPCNP-BC Advanced Practice Stroke Nurse [place] for Schedule & Pager information 01/11/2019 4:41 PM   ATTENDING NOTE: I reviewed above note and agree with the assessment and plan. Pt was seen and examined.   No acute event overnight, no neuro changes.  Still has global aphasia and right hemiplegia, on tube feeding.  CT repeated showed left MCA subcortical infarcts within left corona radiata.  Left subdural hematoma unchanged.  Patient neurological deficit likely to be explained by the left MCA infarct.  Given poor prognosis, agree with palliative care involvement for further discussion of the [place]. Neurology will sign off. Please call with questions. Pt will follow up with stroke clinic NP at [facility] in about 4 weeks after discharge. Thanks for the consult.   Stacy Hawking, MD PhD Stroke Neurology 01/11/2019 6:48 PM     To contact Stroke Continuity provider, please refer to [email or web address] After hours, contact General Neurology

## 2019-01-12 LAB — GLUCOSE, CAPILLARY
Glucose-Capillary: 196 mg/dL — ABNORMAL HIGH (ref 70–99)
Glucose-Capillary: 195 mg/dL — ABNORMAL HIGH (ref 70–99)
Glucose-Capillary: 227 mg/dL — ABNORMAL HIGH (ref 70–99)
Glucose-Capillary: 134 mg/dL — ABNORMAL HIGH (ref 70–99)
Glucose-Capillary: 159 mg/dL — ABNORMAL HIGH (ref 70–99)

## 2019-01-12 LAB — CBC
HCT: 31.8 % — ABNORMAL LOW (ref 36.0–46.0)
Hemoglobin: 9.8 g/dL — ABNORMAL LOW (ref 12.0–15.0)
MCH: 30.8 pg (ref 26.0–34.0)
MCHC: 30.8 g/dL (ref 30.0–36.0)
MCV: 100 fL (ref 80.0–100.0)
Platelets: 236 10*3/uL (ref 150–400)
RBC: 3.18 MIL/uL — AB (ref 3.87–5.11)
RDW: 15.5 % (ref 11.5–15.5)
WBC: 9 10*3/uL (ref 4.0–10.5)
nRBC: 0.7 % — ABNORMAL HIGH (ref 0.0–0.2)

## 2019-01-12 LAB — BASIC METABOLIC PANEL
Anion gap: 9 (ref 5–15)
BUN: 30 mg/dL — ABNORMAL HIGH (ref 8–23)
CO2: 30 mmol/L (ref 22–32)
Calcium: 8.5 mg/dL — ABNORMAL LOW (ref 8.9–10.3)
Chloride: 109 mmol/L (ref 98–111)
Creatinine, Ser: 0.72 mg/dL (ref 0.44–1.00)
GFR calc Af Amer: >60 mL/min (ref >60)
Glucose, Bld: 202 mg/dL — ABNORMAL HIGH (ref 70–99)
Potassium: 3.8 mmol/L (ref 3.5–5.1)
Sodium: 148 mmol/L — ABNORMAL HIGH (ref 135–145)

## 2019-01-12 MED ORDER — FUROSEMIDE 20 MG PO TABS
20.0000 mg | ORAL_TABLET | Freq: Once | ORAL | Status: AC
  Administered 2019-01-12: 20 mg
  Filled 2019-01-12: qty 1

## 2019-01-12 NOTE — Progress Notes (Signed)
PROGRESS NOTE    Stacy [name]  YPP:[id number]  DOB: May 30, 1936  DOA: 01/05/2019 PCP: Rusty Aus, MD  Brief Narrative:   83 year old female with history of hypertension hyperlipidemia, Diabetes mellitus, hypothyroidism, breast cancer,A. fib on chronic anticoagulation with warfarin who had a fall 2 weeks back as well as on the day of admission presented to this [facility] on January 21 with right facial droop and aphasia.  Patient felt to have left MCA stroke due to left MI occlusion.  Seen by neurology.  She failed mechanical thrombectomy and balloon angioplasty attempt, stent not deployed due to concurrent subdural hematoma and increased risk of hemorrhage.  Neurology has been following this patient closely.  Anticoagulation has been on hold due to subdural hematoma.  Imaging studies of the head have not shown brain infarcts on CT scan x3.  Repeat EEG, negative for epileptiform activities  Subjective: -Family reports that patient is more alert today  Assessment & Plan:   1.  Acute left MCA stroke, due to M1 occlusion -Status post failed mechanical thrombectomy x2 and balloon angioplasty -CTA head & neck ELVO L M1 thrombus.  Mixed density SDH left cerebral convexity. Cerebral angiogram endovascular TICI2b revascularization L M1 occlusion with 2 passes embotrap, 1 pass Solitaire.  Angioplasty x2 with continued TICI2b revascularization with reocclusion.  -Admission CT and repeat CT head noted subdural hematoma -Stent could not be deployed due to concurrent subdural hematoma of moderate size and increased risk of hemorrhage if antiplatelets were needed -Unable to have an MRI due to pacemaker, stroke not noted on repeat CTs -EEG negative for epileptiform activity x2 -Appreciate neurology input, suspected to be an embolic stroke from atrial fibrillation with subtherapeutic INR  -With resultant dense right hemiplegia, global aphasia, severe dysphagia  -2D echocardiogram with  preserved EF, no cardiac source of embolus -LDL was 35, hemoglobin A1c 6.9 -Discussed overall prognosis with husband and granddaughter 1/24, she was started on core track and tube feeds  -No change in stroke deficits at this time, palliative consultation requested for goals of care per patient's family she would not want a PEG tube, any artificial means to stay alive -remains off anticoagulation due to subdural hematoma, continue statin -Started on aspirin 81 mg p.o. tube   3. Aspiration pneumonia  -With worsening lethargy from 1/26 and slightly increased work of breathing, repeat x-ray 1/27 noted right lower lobe infiltrate, with low-grade fevers she was started on IV Unasyn yesterday  -More alert today -Continue antibiotics for 6 to 7 days  4.  Left subdural hematoma: In the setting of falls/chronic anticoagulation.  Maximum thickness at 10 mm with mild local mass-effect. -Coumadin discontinued  5.  Chronic atrial fibrillation:  -Continue Toprol at a lower dose, anticoagulation on hold as above  6.  Diabetes mellitus type 2:  -Hemoglobin A1c at 6.9.   -CBG is elevated after starting tube feeds, started Lantus, will increase dose  7.  History of left breast cancer:  8. Elevated LFTs: monitor on statins   DVT prophylaxis: SCD Code Status: DNR Family / Patient Communication: Discussed with granddaughter and husband at bedside Disposition Plan: To be determined  Objective: Vitals:   01/12/19 0404 01/12/19 0500 01/12/19 0926 01/12/19 1236  BP: 136/62  136/65 116/62  Pulse: 70  70 69  Resp: 20  18 18   Temp: 98.3 F (36.8 C)  98.5 F (36.9 C) 98 F (36.7 C)  TempSrc: Axillary  Oral   SpO2: 95%  100% 99%  Weight:  72.1 kg    Height:        Intake/Output Summary (Last 24 hours) at 01/12/2019 1518 Last data filed at 01/12/2019 1000 Gross per 24 hour  Intake 100 ml  Output 925 ml  Net -825 ml   Filed Weights   01/06/19 1400 01/09/19 0409 01/12/19 0500  Weight: 71.1 kg  72.9 kg 72.1 kg    Physical Examination: Gen: Chronically ill-appearing female, more alert today, global aphasia, left gaze preference HEENT: PERRLA, Neck supple, no JVD Lungs: Decreased breath sounds at bases, scattered rhonchi CVS: RRR,No Gallops,Rubs or new Murmurs Abd: soft, Non tender, non distended, BS present Extremities: Trace edema Skin: no new rashes Neuro: Global aphasia, dense right hemiplegia  Data Reviewed: I have personally reviewed following labs and imaging studies  CBC: Recent Labs  Lab 01/06/19 0510  01/08/19 0639 01/09/19 0407 01/10/19 1001 01/11/19 0804 01/12/19 0821  WBC 13.3*   < > 8.7 8.8 11.1* 10.3 9.0  NEUTROABS 10.0*  --   --   --   --   --   --   HGB 8.0*   < > 8.0* 8.5* 9.5* 9.7* 9.8*  HCT 25.7*   < > 25.6* 27.3* 30.7* 31.0* 31.8*  MCV 98.8   < > 98.1 96.8 97.8 98.4 100.0  PLT 233   < > 228 242 265 252 236   < > = values in this interval not displayed.   Basic Metabolic Panel: Recent Labs  Lab 01/08/19 0639 01/09/19 0407 01/10/19 1001 01/11/19 0517 01/12/19 0437  NA 143 142 144 144 148*  K 2.9* 3.3* 3.7 3.5 3.8  CL 114* 111 110 108 109  CO2 18* 22 26 25 30   GLUCOSE 155* 260* 267* 267* 202*  BUN 16 16 20  24* 30*  CREATININE 0.96 0.66 0.72 0.76 0.72  CALCIUM 8.4* 8.7* 8.9 8.8* 8.5*   GFR: Estimated Creatinine Clearance: 55.8 mL/min (by C-G formula based on SCr of 0.72 mg/dL). Liver Function Tests: No results for input(s): AST, ALT, ALKPHOS, BILITOT, PROT, ALBUMIN in the last 168 hours. No results for input(s): LIPASE, AMYLASE in the last 168 hours. Recent Labs  Lab 01/07/19 0943  AMMONIA 14   Coagulation Profile: No results for input(s): INR, PROTIME in the last 168 hours. Cardiac Enzymes: No results for input(s): CKTOTAL, CKMB, CKMBINDEX, TROPONINI in the last 168 hours. BNP (last 3 results) No results for input(s): PROBNP in the last 8760 hours. HbA1C: No results for input(s): HGBA1C in the last 72 hours. CBG: Recent  Labs  Lab 01/11/19 2053 01/11/19 2326 01/12/19 0341 01/12/19 0723 01/12/19 1120  GLUCAP 264* 237* 196* 195* 227*   Lipid Profile: No results for input(s): CHOL, HDL, LDLCALC, TRIG, CHOLHDL, LDLDIRECT in the last 72 hours. Thyroid Function Tests: No results for input(s): TSH, T4TOTAL, FREET4, T3FREE, THYROIDAB in the last 72 hours. Anemia Panel: No results for input(s): VITAMINB12, FOLATE, FERRITIN, TIBC, IRON, RETICCTPCT in the last 72 hours. Sepsis Labs: No results for input(s): PROCALCITON, LATICACIDVEN in the last 168 hours.  Recent Results (from the past 240 hour(s))  MRSA PCR Screening     Status: None   Collection Time: 01/05/19  1:03 PM  Result Value Ref Range Status   MRSA by PCR NEGATIVE NEGATIVE Final    Comment:        The GeneXpert MRSA Assay (FDA approved for NASAL specimens only), is one component of a comprehensive MRSA colonization surveillance program. It is not intended to diagnose MRSA infection nor to  guide or monitor treatment for MRSA infections. Performed at [facility], [place] [address], [place], [place] [address]       Radiology Studies: Dg Chest 2 View  Result Date: 01/11/2019 CLINICAL DATA:  83 year old female with hypoxia. History of stroke. Subsequent encounter. EXAM: CHEST - 2 VIEW COMPARISON:  01/10/2019 and 01/05/2019 chest x-ray FINDINGS: As best appreciated on lateral view, increase consolidation involving the lung bases greater on the right. This may represent pleural fluid with basilar atelectasis although lower lobe infiltrate, particularly on the right, can not be excluded. Mild pulmonary vascular congestion/mild pulmonary edema. Cardiomegaly with sequential pacemaker in place. Post left breast surgery and axillary lymph node dissection. Feeding tube in place, tip not imaged on frontal view. Calcified aorta. IMPRESSION: 1. Increased consolidation involving lung bases greater on right. This may represent pleural fluid with atelectasis  although lower lobe infiltrate, particularly on the right, could not be excluded in proper clinical setting. 2. Pulmonary vascular congestion/mild pulmonary edema. 3. Cardiomegaly with pacemaker in place 4.  Aortic Atherosclerosis (ICD10-I70.0). Electronically Signed   By: Genia Del M.D.   On: 01/11/2019 08:01   Ct Head Wo Contrast  Result Date: 01/11/2019 CLINICAL DATA:  Stroke follow-up EXAM: CT HEAD WITHOUT CONTRAST TECHNIQUE: Contiguous axial images were obtained from the base of the skull through the vertex without intravenous contrast. COMPARISON:  01/07/2019 FINDINGS: Brain: Low-density left subdural hematoma is unchanged in size and appearance. There is periventricular hypoattenuation compatible with chronic microvascular disease. There is progressive hypoattenuation within the left corona radiata. No intraparenchymal hemorrhage. No midline shift or other mass effect. Vascular: No abnormal hyperdensity of the major intracranial arteries or dural venous sinuses. No intracranial atherosclerosis. Skull: The visualized skull base, calvarium and extracranial soft tissues are normal. Sinuses/Orbits: No fluid levels or advanced mucosal thickening of the visualized paranasal sinuses. No mastoid or middle ear effusion. The orbits are normal. IMPRESSION: 1. Unchanged size and appearance of low-density left subdural hematoma. 2. Worsening hypoattenuation within the left corona radiata, consistent with evolving subacute infarct. 3. No midline shift or other mass effect. Electronically Signed   By: Ulyses Jarred M.D.   On: 01/11/2019 01:14        Scheduled Meds: . aspirin  81 mg Per Tube Daily  . atorvastatin  10 mg Per Tube q1800  . chlorhexidine  15 mL Mouth Rinse BID  . fluticasone  2 spray Each Nare Daily  . furosemide  20 mg Per Tube Once  . gabapentin  100 mg Per Tube BID  . heparin injection (subcutaneous)  5,000 Units Subcutaneous Q8H  . insulin aspart  0-15 Units Subcutaneous Q4H  .  insulin glargine  35 Units Subcutaneous BID  . iopamidol  100 mL Intravenous Once  . levothyroxine  75 mcg Per Tube Q0600  . magnesium oxide  400 mg Per Tube Daily  . mouth rinse  15 mL Mouth Rinse BID  . metoprolol tartrate  25 mg Per Tube BID  . pantoprazole sodium  40 mg Per Tube Daily   Continuous Infusions: . ampicillin-sulbactam (UNASYN) IV 3 g (01/12/19 1113)  . feeding supplement (JEVITY 1.2 CAL) 1,000 mL (01/12/19 1448)      LOS: 7 days    Time spent: 49min    Domenic Polite, MD  01/12/2019, 3:18 PM

## 2019-01-12 NOTE — Evaluation (Signed)
Occupational Therapy Evaluation Patient Details Name: Stacy [name] MRN: [id number] DOB: 19-Jun-1936 Today's Date: 01/12/2019    History of Present Illness Pt is an 83 y/o female admitted after fall. Found to have R facial droop and aphasia as well. Imaging revealed L MCA infarct, and L convexity subdural hematoma. Pt is s/p thrombectomy X3 and balloon angioplasty and had subsequent reocclusion. Repeat CT 1/27 revealed worsening hypoattenuation within the left corona radiata, consistent with evolving subacute infarct. PMH includes DM, breast cancer, a fib.    Clinical Impression   Patient progressing slowly.  Able to tolerate sitting EOB with 0 hand support given mod-max assist, R lateral lean.  Utilize L UE to engage in ADLs to R side (UE, hand, leg) with hand over hand support initially, fading to min assist given increased tactile input.  Patient with inconsistent following commands with L UE.Remains flaccid on R UE, elevated on pillow due to edema. Continues to resist cervical rotation to R side, but able to gaze to R initially with cueing. Will continue to follow.     Follow Up Recommendations  CIR;Supervision/Assistance - 24 hour    Equipment Recommendations  None recommended by OT    Recommendations for Other Services Rehab consult     Precautions / Restrictions Precautions Precautions: Fall Restrictions Weight Bearing Restrictions: No      Mobility Bed Mobility Overal bed mobility: Needs Assistance Bed Mobility: Supine to Sit;Sit to Supine     Supine to sit: Max assist;+2 for physical assistance Sit to supine: Max assist;+2 for physical assistance   General bed mobility comments: Patient required maxA +2 for all bed mobility. Patient required multimodal cues for sequencing. Patient with increased alertness/arousal upon sitting EOB. Patient followed simple one step commands inconsistently while sitting EOB. Able to briefly track towards midline x1 with verbal and visual  cues. Unsure if purposeful or spontaneous as patient did not track on second attempt. Patient fatigues very quickly.  Transfers                      Balance Overall balance assessment: Needs assistance Sitting-balance support: Feet supported;No upper extremity supported Sitting balance-Leahy Scale: Poor Sitting balance - Comments: engaged L UE in self care activities, seated with 0 hand support and max assist; R lateral lean                                    ADL either performed or assessed with clinical judgement   ADL Overall ADL's : Needs assistance/impaired     Grooming: Wash/dry face;Wash/dry hands;Maximal assistance;Sitting Grooming Details (indicate cue type and reason): pt initally resists moving washcloth towards her face requires max assist, requires hand over hand to initate washing R hand but able to take over after with min assist Upper Body Bathing: Maximal assistance Upper Body Bathing Details (indicate cue type and reason): hand over hand to initate washing R UE, able to complete partially                          Functional mobility during ADLs: Maximal assistance;+2 for physical assistance;+2 for safety/equipment       Vision   Additional Comments: L gaze preference, but able to scan to R with verbal cueing (inconsistent)      Perception     Praxis      Pertinent Vitals/Pain Pain Assessment: Faces Faces  Pain Scale: Hurts little more Pain Location: generalized Pain Intervention(s): Monitored during session     Hand Dominance     Extremity/Trunk Assessment Upper Extremity Assessment Upper Extremity Assessment: RUE deficits/detail RUE Deficits / Details: no active movement Rt UE - appears flaccid.  PROM WFL  RUE Coordination: decreased fine motor;decreased gross motor           Communication     Cognition Arousal/Alertness: Lethargic Behavior During Therapy: Flat affect Overall Cognitive Status: Difficult to  assess                                 General Comments: Difficult to assess secondary to global aphasia; no verbalizations during session but smiles two times   General Comments  Resisted head turning to R side, able to track towards R side today initally but fades with fatigue,.     Exercises Exercises: Other exercises Other Exercises Other Exercises: PROM of R UE from shoulder to hand    Shoulder Instructions      Home Living                                          Prior Functioning/Environment                   OT Problem List:        OT Treatment/Interventions:      OT Goals(Current goals can be found in the care plan section) Acute Rehab OT Goals Patient Stated Goal: none stated Time For Goal Achievement: 01/22/19 Potential to Achieve Goals: Good  OT Frequency: Min 2X/week   Barriers to D/C:            Co-evaluation              AM-PAC OT "6 Clicks" Daily Activity     Outcome Measure Help from another person eating meals?: Total Help from another person taking care of personal grooming?: Total Help from another person toileting, which includes using toliet, bedpan, or urinal?: Total Help from another person bathing (including washing, rinsing, drying)?: Total Help from another person to put on and taking off regular upper body clothing?: Total Help from another person to put on and taking off regular lower body clothing?: Total 6 Click Score: 6   End of Session Nurse Communication: Mobility status  Activity Tolerance: Patient tolerated treatment well Patient left: in bed;with call bell/phone within reach;with bed alarm set  OT Visit Diagnosis: Hemiplegia and hemiparesis;Cognitive communication deficit (R41.841) Symptoms and signs involving cognitive functions: Cerebral infarction Hemiplegia - Right/Left: Right Hemiplegia - dominant/non-dominant: Dominant Hemiplegia - caused by: Cerebral infarction                 Time: 1950-9326 OT Time Calculation (min): 21 min Charges:  OT General Charges $OT Visit: 1 Visit OT Treatments $Self Care/Home Management : 8-22 mins  Delight Stare, OT Acute Rehabilitation Services Pager [phone] Office [phone]   Delight Stare 01/12/2019, 4:47 PM

## 2019-01-12 NOTE — Progress Notes (Signed)
Inpatient Diabetes Program Recommendations  AACE/ADA: New Consensus Statement on Inpatient Glycemic Control   Target Ranges:  Prepandial:   less than 140 mg/dL      Peak postprandial:   less than 180 mg/dL (1-2 hours)      Critically ill patients:  140 - 180 mg/dL   Results for Stacy [name], Stacy [name] (MRN [id number]) as of 01/12/2019 11:19  Ref. Range 01/11/2019 08:04 01/11/2019 11:22 01/11/2019 15:54 01/11/2019 20:26 01/11/2019 20:53 01/11/2019 23:26 01/12/2019 03:41 01/12/2019 07:23  Glucose-Capillary Latest Ref Range: 70 - 99 mg/dL 231 (H) 247 (H) 174 (H) 263 (H) 264 (H) 237 (H) 196 (H) 195 (H)   Review of Glycemic Control  Current orders for Inpatient glycemic control: Lantus 35 units BID, Novolog 0-15 units Q4H; Jevity @ 60 ml/hr  Inpatient Diabetes Program Recommendations:  Insulin - Tube Feeding Coverage: Please consider ordering Novolog 3 units Q4H for tube feeding coverage. If tube feeding is stopped or held then tube feeding coverage should also be stopped or held.  Insulin-Basal: Noted Lantus increased to 35 units BID yesterday afternoon.  Thanks, Barnie Alderman, RN, MSN, CDE Diabetes Coordinator Inpatient Diabetes Program [phone] (Team Pager from 8am to 5pm)

## 2019-01-12 NOTE — Progress Notes (Signed)
Patient had 10 beats run of VT per central monitor. No changes observed when RN assessed patient.  Will continue monitor patient closely.

## 2019-01-13 DIAGNOSIS — W19XXXA Unspecified fall, initial encounter: Secondary | ICD-10-CM

## 2019-01-13 DIAGNOSIS — Y92009 Unspecified place in unspecified non-institutional (private) residence as the place of occurrence of the external cause: Secondary | ICD-10-CM

## 2019-01-13 DIAGNOSIS — R296 Repeated falls: Secondary | ICD-10-CM

## 2019-01-13 DIAGNOSIS — S065X9A Traumatic subdural hemorrhage with loss of consciousness of unspecified duration, initial encounter: Secondary | ICD-10-CM

## 2019-01-13 DIAGNOSIS — Z515 Encounter for palliative care: Secondary | ICD-10-CM

## 2019-01-13 DIAGNOSIS — S065X0A Traumatic subdural hemorrhage without loss of consciousness, initial encounter: Secondary | ICD-10-CM

## 2019-01-13 DIAGNOSIS — S065XAA Traumatic subdural hemorrhage with loss of consciousness status unknown, initial encounter: Secondary | ICD-10-CM

## 2019-01-13 LAB — GLUCOSE, CAPILLARY
GLUCOSE-CAPILLARY: 174 mg/dL — AB (ref 70–99)
GLUCOSE-CAPILLARY: 201 mg/dL — AB (ref 70–99)
Glucose-Capillary: 131 mg/dL — ABNORMAL HIGH (ref 70–99)
Glucose-Capillary: 101 mg/dL — ABNORMAL HIGH (ref 70–99)
Glucose-Capillary: 101 mg/dL — ABNORMAL HIGH (ref 70–99)
Glucose-Capillary: 79 mg/dL (ref 70–99)
Glucose-Capillary: 137 mg/dL — ABNORMAL HIGH (ref 70–99)

## 2019-01-13 LAB — BASIC METABOLIC PANEL
Anion gap: 5 (ref 5–15)
BUN: 27 mg/dL — ABNORMAL HIGH (ref 8–23)
CO2: 35 mmol/L — ABNORMAL HIGH (ref 22–32)
Calcium: 8.3 mg/dL — ABNORMAL LOW (ref 8.9–10.3)
Chloride: 106 mmol/L (ref 98–111)
Creatinine, Ser: 0.79 mg/dL (ref 0.44–1.00)
GFR calc Af Amer: >60 mL/min (ref >60)
GFR calc non Af Amer: >60 mL/min (ref >60)
Glucose, Bld: 127 mg/dL — ABNORMAL HIGH (ref 70–99)
Potassium: 3.3 mmol/L — ABNORMAL LOW (ref 3.5–5.1)
SODIUM: 146 mmol/L — AB (ref 135–145)

## 2019-01-13 LAB — CBC
HCT: 28.8 % — ABNORMAL LOW (ref 36.0–46.0)
Hemoglobin: 8.9 g/dL — ABNORMAL LOW (ref 12.0–15.0)
MCH: 31.1 pg (ref 26.0–34.0)
MCHC: 30.9 g/dL (ref 30.0–36.0)
MCV: 100.7 fL — ABNORMAL HIGH (ref 80.0–100.0)
Platelets: 202 10*3/uL (ref 150–400)
RBC: 2.86 MIL/uL — ABNORMAL LOW (ref 3.87–5.11)
RDW: 15 % (ref 11.5–15.5)
WBC: 7 10*3/uL (ref 4.0–10.5)
nRBC: 0.4 % — ABNORMAL HIGH (ref 0.0–0.2)

## 2019-01-13 MED ORDER — POTASSIUM CHLORIDE 20 MEQ/15ML (10%) PO SOLN
40.0000 meq | Freq: Every day | ORAL | Status: AC
  Administered 2019-01-13: 40 meq
  Filled 2019-01-13: qty 30

## 2019-01-13 NOTE — Progress Notes (Signed)
Physical Therapy Treatment Patient Details Name: Stacy [name] MRN: [id number] DOB: 09/09/1936 Today's Date: 01/13/2019    History of Present Illness Pt is an 83 y/o female admitted after fall. Found to have R facial droop and aphasia as well. Imaging revealed L MCA infarct, and L convexity subdural hematoma. Pt is s/p thrombectomy X3 and balloon angioplasty and had subsequent reocclusion. Repeat CT 1/27 revealed worsening hypoattenuation within the left corona radiata, consistent with evolving subacute infarct. PMH includes DM, breast cancer, a fib.     PT Comments    Pt performed rolling to R and L, sat edge of bed x 10 min and moved from bed to recliner chair via squat pivot.  Pt slow to respond and required +2 external assistance throughout.  ONce positioned into midline seated edge of bed she was able to maintain sitting balance with LUE and intermittent cueing for weight shifting.  Pt would benefir from skilled rehab in a post acute setting to maximize function before returning home.     Follow Up Recommendations  CIR;Supervision/Assistance - 24 hour     Equipment Recommendations  None recommended by PT    Recommendations for Other Services Rehab consult     Precautions / Restrictions Precautions Precautions: Fall Restrictions Weight Bearing Restrictions: No    Mobility  Bed Mobility Overal bed mobility: Needs Assistance Bed Mobility: Sidelying to Sit;Rolling Rolling: Max assist;+2 for physical assistance;Total assist(To the L total assistance to the R max assistance.  ) Sidelying to sit: Max assist;+2 for physical assistance       General bed mobility comments: Pt required +2 assistance to Roll to R and L for perianal care before attempting transfer to edge of bed.  Pt required increased assistance to roll to the L side.  Pt performed sit to sideying and sit to supine from both sides of bed.  When patient sat up on the R side of bed and able to sit with LUE support x 8  min and 2 min unsupported. Constant cueing to maintain midline with intermittent min to moderate assistance to maintain balance.    Transfers Overall transfer level: Needs assistance Equipment used: 2 person hand held assist Transfers: Squat Pivot Transfers     Squat pivot transfers: Max assist;+2 physical assistance     General transfer comment: Pt required increased assistance to boost into partial standing and to pivot from bed to recliner chair.  Pt presents with R lateral lean in sitting and required pillow for propping and to support RUE in sitting.    Ambulation/Gait                 Stairs             Wheelchair Mobility    Modified Rankin (Stroke Patients Only)       Balance     Sitting balance-Leahy Scale: Poor       Standing balance-Leahy Scale: Poor                              Cognition Arousal/Alertness: Lethargic Behavior During Therapy: Flat affect Overall Cognitive Status: Difficult to assess                                 General Comments: Difficult to assess secondary to global aphasia; no verbalizations during session but smiles two times      Exercises  General Comments        Pertinent Vitals/Pain Pain Assessment: Faces Faces Pain Scale: No hurt    Home Living                      Prior Function            PT Goals (current goals can now be found in the care plan section) Acute Rehab PT Goals Patient Stated Goal: none stated Potential to Achieve Goals: Fair Progress towards PT goals: Progressing toward goals    Frequency    Min 4X/week      PT Plan Current plan remains appropriate    Co-evaluation              AM-PAC PT "6 Clicks" Mobility   Outcome Measure  Help needed turning from your back to your side while in a flat bed without using bedrails?: A Lot Help needed moving from lying on your back to sitting on the side of a flat bed without using  bedrails?: A Lot Help needed moving to and from a bed to a chair (including a wheelchair)?: Total Help needed standing up from a chair using your arms (e.g., wheelchair or bedside chair)?: Total Help needed to walk in hospital room?: Total Help needed climbing 3-5 steps with a railing? : Total 6 Click Score: 8    End of Session Equipment Utilized During Treatment: Gait belt Activity Tolerance: Patient limited by fatigue Patient left: in bed;with call bell/phone within reach;with bed alarm set;with family/visitor present Nurse Communication: Mobility status PT Visit Diagnosis: Muscle weakness (generalized) (M62.81);Difficulty in walking, not elsewhere classified (R26.2);Hemiplegia and hemiparesis Hemiplegia - Right/Left: Right Hemiplegia - caused by: Cerebral infarction     Time: 4360-6770 PT Time Calculation (min) (ACUTE ONLY): 27 min  Charges:  $Therapeutic Activity: 23-37 mins                     Governor Rooks, PTA Acute Rehabilitation Services Pager [phone] Office [phone]     Izzy Courville Eli Hose 01/13/2019, 12:33 PM

## 2019-01-13 NOTE — Progress Notes (Signed)
  Speech Language Pathology Treatment: Dysphagia;Cognitive-Linquistic  Patient Details Name: Stacy [name] MRN: [id number] DOB: July 08, 1936 Today's Date: 01/13/2019 Time: 4481-8563 SLP Time Calculation (min) (ACUTE ONLY): 17 min  Assessment / Plan / Recommendation Clinical Impression  Pt was seen for treatment to assess improvement in swallow function. Pt did not communicate verbally, complete automatic sequences, or follow 1-step commands despite max cues.   Oral care was provided by SLP. Trials were limited to puree solids due to the pt's performance. Despite max cues, pt did not attempt bolus formation/manipulation demonstrate a closed-mouth posture following bolus presentation, or exhibit a pharyngeal swallow. The puree bolus was ultimately removed from the pt's buccal cavity via oral swab and suctioning.   Pt has not demonstrated any improvement in swallow function or language since the initial bedside swallow evaluation of 01/07/19. Her rehab potential with regards to swallowing is judged to be poor at this time and the pt's family reported that the pt would not want long-term non-oral nutrition. Palliative care has been consulted and SLP discussed the pt's performance and dysphagia prognosis with Mary-Ann from Palliative Care. She indicated that the Palliative Care team will be meeting with the family to discuss goals of care. SLP will follow to continue to assess any improvement in swallow function and safety of p.o. intake.    HPI HPI: Pt is an 83 y.o. female with DM, HLD, hypothyroidism, breast cancer, paroxysmal atrial fibrillation anticoagulated on warfarin, who was admitted following a fall at home. She states that she woke up and walked normally to the bathroom, then fell and "everything went haywire". EMS reported an initial right facial droop and speech difficulty. They noted that she was not completing sentence and was having difficulty finding words. Since arrival to the ED, her  speech deficit has waxed and waned. The CT of the head of 01/06/19 was negative for acute infarct but revealed dense left MCA: recurrent occlusion possible versus enhancingresidual thrombus. Similar to decreased mixed density LEFT holo hemispheric subdural hematoma. She is status post failed mechanical thrombectomy and balloon angioplasty. Stent not deployed due to concurrent subdural hematoma and increased risk of hemorrhage.      SLP Plan  Continue with current plan of care       Recommendations  Diet recommendations: NPO Medication Administration: Via alternative means                Oral Care Recommendations: Oral care QID Follow up Recommendations: 24 hour supervision/assistance;Skilled Nursing facility SLP Visit Diagnosis: Dysphagia, oropharyngeal phase (R13.12) Plan: Continue with current plan of care       Leea Rambeau I. Hardin Negus, [place], [place] Office number [phone] Pager [place] 01/13/2019, 9:18 AM

## 2019-01-13 NOTE — Progress Notes (Signed)
PROGRESS NOTE  Stacy [name]  UUV:[id number]  DOB: 05-27-1936  DOA: 01/05/2019 PCP: Rusty Aus, MD  Brief Narrative: 83 year old female with history of hypertension hyperlipidemia, Diabetes mellitus, hypothyroidism, breast cancer,A. fib on chronic anticoagulation with warfarin who had a fall 2 weeks back as well as on the day of admission presented to this [facility] on January 21 with right facial droop and aphasia.  Patient felt to have left MCA stroke due to left MI occlusion.  Seen by neurology.  She failed mechanical thrombectomy and balloon angioplasty attempt, stent not deployed due to concurrent subdural hematoma and increased risk of hemorrhage.  Neurology has been following this patient closely.  Anticoagulation has been on hold due to subdural hematoma.  Imaging studies of the head have not shown brain infarcts on CT scan x3.  Repeat EEG, negative for epileptiform activities  Subjective: -No changes, remains alert, aphasic, with dense right hemiplegia   Assessment & Plan:   1.  Acute left MCA stroke, due to M1 occlusion -Status post failed mechanical thrombectomy x2 and balloon angioplasty -CTA head & neck ELVO L M1 thrombus.  Mixed density SDH left cerebral convexity. Cerebral angiogram endovascular TICI2b revascularization L M1 occlusion with 2 passes embotrap, 1 pass Solitaire.  Angioplasty x2 with continued TICI2b revascularization with reocclusion.  -Admission CT and repeat CT head noted subdural hematoma -Stent could not be deployed due to concurrent subdural hematoma of moderate size and increased risk of hemorrhage if antiplatelets were needed -Unable to have an MRI due to pacemaker, stroke not noted on repeat CTs -EEG negative for epileptiform activity x2 -Appreciate neurology input, suspected to be an embolic stroke from atrial fibrillation with subtherapeutic INR  -With resultant dense right hemiplegia, global aphasia, severe dysphagia  -2D echocardiogram with  preserved EF, no cardiac source of embolus -LDL was 35, hemoglobin A1c 6.9 -Discussed overall prognosis with husband and granddaughter through this admission she was started on core track and tube feeds  -No change in stroke deficits at this time, palliative consultation requested for goals of care per patient's family she would not [name] a PEG tube, any artificial means to stay alive -remains off anticoagulation due to subdural hematoma, continue statin -Started on aspirin 81 mg p.o. tube  -Family meeting today, plan to consider hospice  3. Aspiration pneumonia  -With worsening lethargy from 1/26 and slightly increased work of breathing, repeat x-ray 1/27 noted right lower lobe infiltrate, with low-grade fevers she was started on IV Unasyn yesterday  -Mental status improved and more alert since starting antibiotics -Day 3 of 7 of antibiotics today  4.  Left subdural hematoma: In the setting of falls/chronic anticoagulation.  Maximum thickness at 10 mm with mild local mass-effect. -Coumadin discontinued  5.  Chronic atrial fibrillation:  -Continue Toprol at a lower dose, anticoagulation on hold as above  6.  Diabetes mellitus type 2:  -Hemoglobin A1c at 6.9.   -CBG is elevated after starting tube feeds, started Lantus, increased dose 7.  History of left breast cancer:  8. Elevated LFTs: monitor on statins   DVT prophylaxis: SCD Code Status: DNR Family / Patient Communication: Discussed with granddaughter and husband at bedside today Disposition Plan: To be determined, palliative care meeting today  Objective: Vitals:   01/13/19 0000 01/13/19 0338 01/13/19 0729 01/13/19 1235  BP: 121/64 126/70 114/62 (!) 119/57  Pulse: 76 77 78 73  Resp: 20 (!) 21 18 18   Temp: 98.5 F (36.9 C) 98.3 F (36.8 C) 98.6 F (  37 C) 97.6 F (36.4 C)  TempSrc: Axillary Axillary Oral Oral  SpO2: 100% 98% 100% 100%  Weight:      Height:        Intake/Output Summary (Last 24 hours) at 01/13/2019  1328 Last data filed at 01/13/2019 0430 Gross per 24 hour  Intake 920 ml  Output 100 ml  Net 820 ml   Filed Weights   01/06/19 1400 01/09/19 0409 01/12/19 0500  Weight: 71.1 kg 72.9 kg 72.1 kg    Physical Examination: Gen: Chronically ill-appearing female, more alert, severe global aphasia, left gaze preference HEENT: PERRLA, Neck supple, no JVD Lungs: Decreased breath sounds at both bases, few rhonchi CVS: RRR,No Gallops,Rubs or new Murmurs Abd: soft, Non tender, non distended, BS present Extremities: Trace edema Skin: no new rashes Neuro: Global aphasia, dense right hemiplegia  Data Reviewed: I have personally reviewed following labs and imaging studies  CBC: Recent Labs  Lab 01/09/19 0407 01/10/19 1001 01/11/19 0804 01/12/19 0821 01/13/19 0502  WBC 8.8 11.1* 10.3 9.0 7.0  HGB 8.5* 9.5* 9.7* 9.8* 8.9*  HCT 27.3* 30.7* 31.0* 31.8* 28.8*  MCV 96.8 97.8 98.4 100.0 100.7*  PLT 242 265 252 236 161   Basic Metabolic Panel: Recent Labs  Lab 01/09/19 0407 01/10/19 1001 01/11/19 0517 01/12/19 0437 01/13/19 0502  NA 142 144 144 148* 146*  K 3.3* 3.7 3.5 3.8 3.3*  CL 111 110 108 109 106  CO2 22 26 25 30  35*  GLUCOSE 260* 267* 267* 202* 127*  BUN 16 20 24* 30* 27*  CREATININE 0.66 0.72 0.76 0.72 0.79  CALCIUM 8.7* 8.9 8.8* 8.5* 8.3*   GFR: Estimated Creatinine Clearance: 55.8 mL/min (by C-G formula based on SCr of 0.79 mg/dL). Liver Function Tests: No results for input(s): AST, ALT, ALKPHOS, BILITOT, PROT, ALBUMIN in the last 168 hours. No results for input(s): LIPASE, AMYLASE in the last 168 hours. Recent Labs  Lab 01/07/19 0943  AMMONIA 14   Coagulation Profile: No results for input(s): INR, PROTIME in the last 168 hours. Cardiac Enzymes: No results for input(s): CKTOTAL, CKMB, CKMBINDEX, TROPONINI in the last 168 hours. BNP (last 3 results) No results for input(s): PROBNP in the last 8760 hours. HbA1C: No results for input(s): HGBA1C in the last 72  hours. CBG: Recent Labs  Lab 01/12/19 1946 01/13/19 0051 01/13/19 0335 01/13/19 0822 01/13/19 1116  GLUCAP 159* 174* 131* 101* 101*   Lipid Profile: No results for input(s): CHOL, HDL, LDLCALC, TRIG, CHOLHDL, LDLDIRECT in the last 72 hours. Thyroid Function Tests: No results for input(s): TSH, T4TOTAL, FREET4, T3FREE, THYROIDAB in the last 72 hours. Anemia Panel: No results for input(s): VITAMINB12, FOLATE, FERRITIN, TIBC, IRON, RETICCTPCT in the last 72 hours. Sepsis Labs: No results for input(s): PROCALCITON, LATICACIDVEN in the last 168 hours.  Recent Results (from the past 240 hour(s))  MRSA PCR Screening     Status: None   Collection Time: 01/05/19  1:03 PM  Result Value Ref Range Status   MRSA by PCR NEGATIVE NEGATIVE Final    Comment:        The GeneXpert MRSA Assay (FDA approved for NASAL specimens only), is one component of a comprehensive MRSA colonization surveillance program. It is not intended to diagnose MRSA infection nor to guide or monitor treatment for MRSA infections. Performed at [facility], [place] [address], [place], [place] [address]       Radiology Studies: No results found.      Scheduled Meds: .  aspirin  81 mg Per Tube Daily  . atorvastatin  10 mg Per Tube q1800  . chlorhexidine  15 mL Mouth Rinse BID  . fluticasone  2 spray Each Nare Daily  . gabapentin  100 mg Per Tube BID  . heparin injection (subcutaneous)  5,000 Units Subcutaneous Q8H  . insulin aspart  0-15 Units Subcutaneous Q4H  . insulin glargine  35 Units Subcutaneous BID  . iopamidol  100 mL Intravenous Once  . levothyroxine  75 mcg Per Tube Q0600  . magnesium oxide  400 mg Per Tube Daily  . mouth rinse  15 mL Mouth Rinse BID  . metoprolol tartrate  25 mg Per Tube BID  . pantoprazole sodium  40 mg Per Tube Daily  . potassium chloride  40 mEq Per Tube Daily   Continuous Infusions: . ampicillin-sulbactam (UNASYN) IV 3 g (01/13/19 1153)  . feeding supplement  (JEVITY 1.2 CAL) 1,000 mL (01/13/19 1200)      LOS: 8 days    Time spent: 80min    Domenic Polite, MD  01/13/2019, 1:28 PM

## 2019-01-13 NOTE — Consult Note (Addendum)
Consultation Note Date: 01/13/2019   Patient Name: Stacy [name]  DOB: 10-23-1936  MRN: [id number]  Age / Sex: 83 y.o., female  PCP: Rusty Aus, MD Referring Physician: Domenic Polite, MD  Reason for Consultation: Establishing goals of care  HPI/Patient Profile: 83 y.o. female  with past medical history of atrial fibrillation (warfarin), breast cancer (s/p left mastectomy), hyperlipidemia, and DM who was admitted on 01/05/2019 with acute CVA with subdural hematoma. Patient presented to the ED following a fall at home with onset of right facial droop and slurring of speech.  She was found to have a left MCA occlusion at M1 and a subdural hematoma.   Clinical Assessment and Goals of Care:  Upon arrival to Stacy [name]'s room, she was resting comfortably in bed. There were no family members in the room. [name] had just finished a speech evaluation which showed no change in her swallowing status.  When food is placed on her tongue she does nothing - no swallow.  I spoke to patient's husband, [name], via phone. [name] himself had a stroke last year and as a result was unable to swallow. He tells me that he was at a hospice house in [place] before gaining his ability to swallow back. He is hopeful that the same will happen with his wife Stacy [name].  We did discuss Mrs. Brining's continued inability to swallow and risk of aspiration. He states that [place] NOT want a permanent feeding tube, and has been firm in communicating this to her family. However, [name] seems to have some cognitive impairment.   I spoke to the patient's daughter, [name], and she confirmed that the patient WOULD NOT want a permanent feeding tube. [name] is very aware of her Mother's condition and we discussed her continued decline and risk of aspiration. [name] accepts this risk. [name] stated that she is working on Armed forces logistics/support/administrative officer for both  [place].    [name] states that Stacy [name] was pretty independent until her recent pacemaker placement a few weeks ago. She reports that after the pacemaker she began to decline secondary to a pericardial effusion.   FOR TODAY: will continue feeding tube.  Follow up [place] on 1/30 with likely transition to hospice care.  We will meet in patient's room tomorrow at 10 am, with [name] and [name], to further discuss next steps in care for Stacy [name] in person.    Primary Decision Maker:  NEXT OF KIN, Husband- [name] and Daughter- [name]    SUMMARY OF RECOMMENDATIONS    Code Status/Advance Care Planning:  DNR  Family meeting tomorrow at [facility] feeding tube for today, will likely remove tomorrow after family meeting   Symptom Management:   Per primary team  Additional Recommendations (Limitations, Scope, Preferences):  Full Scope Treatment for today, likely transition to hospice tomorrow.   Palliative Prophylaxis:   Aspiration  Psycho-social/Spiritual:   Desire for further Chaplaincy support: did not ask  Prognosis:   Days to weeks  Discharge Planning:  Anticipate D/C to hospice house  of [place] per husband.  To be confirmed tomorrow am.      Primary Diagnoses: Present on Admission: . Stroke (cerebrum) ([place]) . Middle cerebral artery embolism, left   I have reviewed the medical record, interviewed the patient and family, and examined the patient. The following aspects are pertinent.  Past Medical History:  Diagnosis Date  . Arthritis   . Atherosclerosis of aorta ([place])   . Breast cancer ([place]) 2008   mastectomy left  . Cancer ([place])    breast  . Diabetes mellitus without complication ([place])   . Dysrhythmia    atrial tachycardia  . Dysrhythmia    PSVT  . Hyperlipidemia   . Hypothyroidism   . Sensorimotor neuropathy    Social History   Socioeconomic History  . Marital status: Married    Spouse name: Not on file  . Number of children: Not  on file  . Years of education: Not on file  . Highest education level: Not on file  Occupational History  . Not on file  Social Needs  . Financial resource strain: Not on file  . Food insecurity:    Worry: Not on file    Inability: Not on file  . Transportation needs:    Medical: Not on file    Non-medical: Not on file  Tobacco Use  . Smoking status: Never Smoker  . Smokeless tobacco: Never Used  Substance and Sexual Activity  . Alcohol use: No  . Drug use: No  . Sexual activity: Not on file  Lifestyle  . Physical activity:    Days per week: Not on file    Minutes per session: Not on file  . Stress: Not on file  Relationships  . Social connections:    Talks on phone: Not on file    Gets together: Not on file    Attends religious service: Not on file    Active member of club or organization: Not on file    Attends meetings of clubs or organizations: Not on file    Relationship status: Not on file  Other Topics Concern  . Not on file  Social History Narrative  . Not on file   Family History  Problem Relation Age of Onset  . Breast cancer Cousin    Scheduled Meds: . aspirin  81 mg Per Tube Daily  . atorvastatin  10 mg Per Tube q1800  . chlorhexidine  15 mL Mouth Rinse BID  . fluticasone  2 spray Each Nare Daily  . gabapentin  100 mg Per Tube BID  . heparin injection (subcutaneous)  5,000 Units Subcutaneous Q8H  . insulin aspart  0-15 Units Subcutaneous Q4H  . insulin glargine  35 Units Subcutaneous BID  . iopamidol  100 mL Intravenous Once  . levothyroxine  75 mcg Per Tube Q0600  . magnesium oxide  400 mg Per Tube Daily  . mouth rinse  15 mL Mouth Rinse BID  . metoprolol tartrate  25 mg Per Tube BID  . pantoprazole sodium  40 mg Per Tube Daily   Continuous Infusions: . ampicillin-sulbactam (UNASYN) IV 3 g (01/13/19 0430)  . feeding supplement (JEVITY 1.2 CAL) 1,000 mL (01/12/19 1448)   PRN Meds:.acetaminophen **OR** acetaminophen (TYLENOL) oral liquid 160  mg/5 mL **OR** acetaminophen, iohexol, senna-docusate Allergies  Allergen Reactions  . Morphine And Related Anaphylaxis  . Amiodarone Other (See Comments)    tremors  . Codeine Other (See Comments)    Hallucinations   . Lovaza [Omega-3-Acid Ethyl  Esters] Diarrhea  . Zaroxolyn [Metolazone] Other (See Comments)  . Ivp Dye [Iodinated Diagnostic Agents] Rash  . Penicillins Rash    Has patient had a PCN reaction causing immediate rash, facial/tongue/throat swelling, SOB or lightheadedness with hypotension: Yes Has patient had a PCN reaction causing severe rash involving mucus membranes or skin necrosis: No Has patient had a PCN reaction that required hospitalization: No Has patient had a PCN reaction occurring within the last 10 years: No If all of the above answers are "NO", then may proceed with Cephalosporin use.  . Sulfa Antibiotics Rash   Review of Systems Unable to verbally communicate.   Physical Exam  General: elderly female, awake and resting comfortably in bed, in no acute distress Lungs: CTAB, no wheezes/rales/rhonchi Cardiac: regular rate and rhythm, no murmurs/rubs/gallops GI: soft, non-distended, no tenderness to palpation Neuro: unable to follow any one step commands, Right neglect present  Vital Signs: BP 114/62 (BP Location: Right Arm)   Pulse 78   Temp 98.6 F (37 C) (Oral)   Resp 18   Ht 5\' 5"  (1.651 m)   Wt 72.1 kg   SpO2 100%   BMI 26.45 kg/m  Pain Scale: PAINAD   Pain Score: 0-No pain   SpO2: SpO2: 100 % O2 Device:SpO2: 100 % O2 Flow Rate: .O2 Flow Rate (L/min): 3 L/min  IO: Intake/output summary:   Intake/Output Summary (Last 24 hours) at 01/13/2019 0818 Last data filed at 01/13/2019 0430 Gross per 24 hour  Intake 920 ml  Output 150 ml  Net 770 ml    LBM: Last BM Date: 01/11/19 Baseline Weight: Weight: 71.1 kg Most recent weight: Weight: 72.1 kg     Palliative Assessment/Data: 10%     Time In: 9:20am Time Out: 10:30am Time Total: 70  min  Greater than 50%  of this time was spent counseling and coordinating care related to the above assessment and plan.   Ferrel Logan, PA-S 01/13/2019  Signed by: Florentina Jenny, PA-C Palliative Medicine Pager: [phone]  Please contact Palliative Medicine Team phone at [phone] for questions and concerns.  For individual provider: See Shea Evans

## 2019-01-14 DIAGNOSIS — Z515 Encounter for palliative care: Secondary | ICD-10-CM

## 2019-01-14 LAB — GLUCOSE, CAPILLARY
Glucose-Capillary: 164 mg/dL — ABNORMAL HIGH (ref 70–99)
Glucose-Capillary: 102 mg/dL — ABNORMAL HIGH (ref 70–99)
Glucose-Capillary: 171 mg/dL — ABNORMAL HIGH (ref 70–99)

## 2019-01-14 MED ORDER — HALOPERIDOL LACTATE 5 MG/ML IJ SOLN: 0.5000 mg | INTRAMUSCULAR | Status: DC | PRN

## 2019-01-14 MED ORDER — SODIUM CHLORIDE 0.9 % IV SOLN: 250.0000 mL | INTRAVENOUS | Status: DC | PRN

## 2019-01-14 MED ORDER — LORAZEPAM 2 MG/ML PO CONC: 0.5000 mg | ORAL | Status: DC | PRN

## 2019-01-14 MED ORDER — INSULIN GLARGINE 100 UNIT/ML ~~LOC~~ SOLN
15.0000 [IU] | Freq: Two times a day (BID) | SUBCUTANEOUS | Status: DC
  Administered 2019-01-14: 15 [IU] via SUBCUTANEOUS
  Filled 2019-01-14 (×2): qty 0.15

## 2019-01-14 MED ORDER — LORAZEPAM 2 MG/ML IJ SOLN: 0.5000 mg | INTRAMUSCULAR | Status: DC | PRN

## 2019-01-14 MED ORDER — HALOPERIDOL 0.5 MG PO TABS
0.5000 mg | ORAL_TABLET | ORAL | Status: DC | PRN
  Filled 2019-01-14: qty 1

## 2019-01-14 MED ORDER — INSULIN GLARGINE 100 UNIT/ML ~~LOC~~ SOLN: 20.0000 [IU] | Freq: Two times a day (BID) | SUBCUTANEOUS | Status: DC

## 2019-01-14 MED ORDER — HALOPERIDOL LACTATE 2 MG/ML PO CONC
0.5000 mg | ORAL | Status: DC | PRN
  Filled 2019-01-14: qty 0.3

## 2019-01-14 MED ORDER — SODIUM CHLORIDE 0.9% FLUSH: 3.0000 mL | INTRAVENOUS | Status: DC | PRN

## 2019-01-14 MED ORDER — BIOTENE DRY MOUTH MT LIQD: 15.0000 mL | OROMUCOSAL | Status: DC | PRN

## 2019-01-14 MED ORDER — SODIUM CHLORIDE 0.9% FLUSH
3.0000 mL | Freq: Two times a day (BID) | INTRAVENOUS | Status: DC
  Administered 2019-01-14 – 2019-01-15 (×3): 3 mL via INTRAVENOUS

## 2019-01-14 MED ORDER — LORAZEPAM 0.5 MG PO TABS: 0.5000 mg | ORAL_TABLET | ORAL | Status: DC | PRN

## 2019-01-14 MED ORDER — KETOROLAC TROMETHAMINE 15 MG/ML IJ SOLN
15.0000 mg | Freq: Two times a day (BID) | INTRAMUSCULAR | Status: DC
  Administered 2019-01-14 – 2019-01-15 (×3): 15 mg via INTRAVENOUS
  Filled 2019-01-14 (×3): qty 1

## 2019-01-14 MED ORDER — GLYCOPYRROLATE 0.2 MG/ML IJ SOLN: 0.2000 mg | INTRAMUSCULAR | Status: DC | PRN

## 2019-01-14 MED ORDER — KETOROLAC TROMETHAMINE 30 MG/ML IJ SOLN: 30.0000 mg | Freq: Four times a day (QID) | INTRAMUSCULAR | Status: DC | PRN

## 2019-01-14 MED ORDER — POLYVINYL ALCOHOL 1.4 % OP SOLN
1.0000 [drp] | Freq: Four times a day (QID) | OPHTHALMIC | Status: DC | PRN
  Filled 2019-01-14: qty 15

## 2019-01-14 MED ORDER — GLYCOPYRROLATE 1 MG PO TABS
1.0000 mg | ORAL_TABLET | ORAL | Status: DC | PRN
  Filled 2019-01-14: qty 1

## 2019-01-14 NOTE — Progress Notes (Signed)
.  PT Cancellation Note  Patient Details Name: Stacy [name] MRN: [id number] DOB: 26-Dec-1935   Cancelled Treatment:    Reason Eval/Treat Not Completed: (P) Other (comment)(Tx deferred PT orders are now discontinued as patient is comfort care.  )   Cristela Blue 01/14/2019, 12:04 PM  Governor Rooks, PTA Acute Rehabilitation Services Pager [phone] Office [phone]

## 2019-01-14 NOTE — Progress Notes (Signed)
Daily Progress Note   Patient Name: Stacy [name]       Date: 01/14/2019 DOB: [birth year]  Age: 83 y.o. MRN#: [id number] Attending Physician: Edwin Dada, * Primary Care Physician: Rusty Aus, MD Admit Date: 01/05/2019  Reason for Consultation/Follow-up: Establishing goals of care and Psychosocial/spiritual support  Subjective: Met with husband, daughter, grand daughter, and pastor at bedside.  Dr. Loleta Books and Tyler Deis PA-S joined as well.  We briefly reviewed diagnosis and prognosis.  Answered multiple questions about expectations at [facility].  Family present in the room is unified in their decision for patient to go to hospice house and be well cared for there.  Alex daughter not present has concerns - I gave the family my number for the grand daughter to call and speak with me if needed.  Discussed discontinuing medications and interventions that do not contribute to Stacy [name]'s comfort  - - and focusing on medications and interventions that will make her comfortable.  Family mentions [place] is a very social person and loves to talk.  Not being able to speak for the last week has likely been extremely frustrating for her.    Assessment: Patient awake.  Her breathing increases and her left foot moves as we have our discussion.  I'm uncertain if this is her wanting to participate or if this is discomfort.   Stacy [name] is unable to swallow.  She was adamantly against feeding tube placement.  Family feels they are moving forward with her wishes.   Patient Profile/HPI:  84 y.o. female  with past medical history of atrial fibrillation (warfarin), breast cancer (s/p left mastectomy), hyperlipidemia, and DM who was admitted on 01/05/2019 with acute CVA with subdural hematoma. Patient  presented to the ED following a fall at home with onset of right facial droop and slurring of speech.  She was found to have a left MCA occlusion at M1 and a subdural hematoma.  Thrombectomy was attempted 2x but unfortunately failed.  Patient is unable to swallow, speak or use her right side.   Length of Stay: 9  Current Medications: Scheduled Meds:  . chlorhexidine  15 mL Mouth Rinse BID  . fluticasone  2 spray Each Nare Daily  . gabapentin  100 mg Per Tube BID  . insulin glargine  20 Units Subcutaneous BID  .  iopamidol  100 mL Intravenous Once  . ketorolac  15 mg Intravenous BID  . mouth rinse  15 mL Mouth Rinse BID  . sodium chloride flush  3 mL Intravenous Q12H    Continuous Infusions: . sodium chloride      PRN Meds: sodium chloride, acetaminophen **OR** acetaminophen (TYLENOL) oral liquid 160 mg/5 mL **OR** acetaminophen, antiseptic oral rinse, glycopyrrolate **OR** glycopyrrolate **OR** glycopyrrolate, haloperidol **OR** haloperidol **OR** haloperidol lactate, ketorolac, LORazepam **OR** LORazepam **OR** LORazepam, polyvinyl alcohol, sodium chloride flush  Physical Exam       Well developed female, eyes open, awake, unable to speak. Head turned left (right sided neglect seems more pronounced than yesterday) CV rrr no m/r/g resp intermittent increased work of breathing, CTA no w/c/r Abdomen soft, nt, nd Lower ext:  Able to move left lower ext.  1+ edema noted in RLE.  Vital Signs: BP (!) 143/84 (BP Location: Right Arm)   Pulse 86   Temp 98.7 F (37.1 C) (Oral)   Resp 16   Ht _0  (1.651 m)   Wt 71.6 kg   SpO2 99%   BMI 26.27 kg/m  SpO2: SpO2: 99 % O2 Device: O2 Device: Room Air O2 Flow Rate: O2 Flow Rate (L/min): 3 L/min  Intake/output summary:   Intake/Output Summary (Last 24 hours) at 01/14/2019 1044 Last data filed at 01/14/2019 8811 Gross per 24 hour  Intake 140 ml  Output 800 ml  Net -660 ml   LBM: Last BM Date: 01/11/19 Baseline Weight: Weight: 71.1  kg Most recent weight: Weight: 71.6 kg       Palliative Assessment/Data: 10%    Flowsheet Rows     Most Recent Value  Intake Tab  Referral Department  Hospitalist  Unit at Time of Referral  Med/Surg Unit  Date Notified  01/12/19  Palliative Care Type  New Palliative care  Reason for referral  Clarify Goals of Care  Date of Admission  01/05/19  # of days IP prior to Palliative referral  7  Clinical Assessment  Psychosocial & Spiritual Assessment  Palliative Care Outcomes      Patient Active Problem List   Diagnosis Date Noted  . Fall at home, initial encounter   . Multiple falls   . Subdural hematoma without coma ([place])   . Palliative care encounter   . Stroke (cerebrum) ([place]) 01/05/2019  . Middle cerebral artery embolism, left 01/05/2019    Palliative Care Plan    Recommendations/Plan:  Will d/c all interventions not related to comfort.  Request for placement at [facility]  Will schedule low dose toradol for comfort.  PRN as well  Will avoid opioid medications - it appears she has anaphylaxis with opioids.  Low dose lorazepam PRN.  PMT will continue to follow for symptoms as long as she is in the hospital.  Goals of Care and Additional Recommendations:  Limitations on Scope of Treatment: Full Comfort Care  Code Status:  DNR  Prognosis:   < 2 weeks   Discharge Planning:  Hospice facility  Care plan was discussed with family, attending team, social work.  Thank you for allowing the Palliative Medicine Team to assist in the care of this patient.  Total time spent:  60 min.     Greater than 50%  of this time was spent counseling and coordinating care related to the above assessment and plan.  Florentina Jenny, PA-C Palliative Medicine  Please contact Palliative MedicineTeam phone at [phone] for questions and concerns between 7  am - 7 pm.   Please see AMION for individual provider pager numbers.

## 2019-01-14 NOTE — Progress Notes (Signed)
SLP Cancellation Note  Patient Details Name: Stacy [name] MRN: [id number] DOB: December 25, 1935   Cancelled treatment:       Reason Eval/Treat Not Completed: Other (comment) Pt will be transitioned to comfort care and SLP orders have therefore been discontinued.    Horton Marshall 01/14/2019, 1:44 PM

## 2019-01-14 NOTE — Progress Notes (Signed)
Nutrition Brief Note  Chart reviewed. Pt transitioning to hospice care. Per Palliative care, plans to discontinue all interventions not related to comfort. Tube feeding orders have been discontinued.  No further nutrition interventions warranted at this time.  Please re-consult as needed.   Corrin Parker, MS, RD, LDN Pager # [phone] After hours/ weekend pager # [phone]

## 2019-01-14 NOTE — Progress Notes (Signed)
PROGRESS NOTE    Stacy [name]  QQP:[id number] DOB: [birth year] DOA: 01/05/2019 PCP: Rusty Aus, MD      Brief Narrative:  Stacy [name] is a 83 y.o. F with hypertension hyperlipidemia, Diabetes mellitus, hypothyroidism, breast cancer,A. fib on chronic anticoagulation with warfarin who had a fall 2 weeks back as well as on the day of admission presented to this [facility] on January 21 with right facial droop and aphasia.  Patient felt to have left MCA stroke due to left MI occlusion.  Seen by neurology.  She failed mechanical thrombectomy and balloon angioplasty attempt, stent not deployed due to concurrent subdural hematoma and increased risk of hemorrhage.  Neurology has been following this patient closely.  Anticoagulation has been on hold due to subdural hematoma.  Imaging studies of the head have not shown brain infarcts on CT scan x3.  Repeat EEG, negative for epileptiform activities   Assessment & Plan:  Acute left MCA stroke, due to M1 occlusion The patient underwent mechanical thrombectomy twice and balloon angioplasty, which were unsuccessful, with reocclusion after revascularization.  Her stroke was complicated by an adjacent subdural hematoma the present stenting.  She has persistent dense right hemiplegia, global aphasia, and severe dysphagia.  Yesterday on speech evaluation after 1 week, the patient had no response to food bolus in the mouth, which had to be suctioned out.  It appears her overall prognosis is extremely poor, and palliative care consultation was recommended.  During extensive conversation with the family today, which I was present, the patient's wishes not to have a feeding tube, and to be allowed to have a natural death were expressed clearly. -Stop aspirin, statin -Remove feeding tube -Stop IV fluids   -May continue gabapentin, Toradol for pain   Aspiration pneumonia Severe dysphagia The recurrent and terminal nature of aspiration was discussed with  family, who are in understanding. -We will stop antibiotics today as we transition to comfort measures.  Left subdural hematoma  Chronic atrial fibrillation  Diabetes -Continue Lantus  History of left breast cancer status post mastectomy  Elevated LFTs -Statin held  Chronic normocytic anemia    MDM and disposition: The below labs and imaging reports were reviewed and summarized above.  Medication management as above.  The patient was admitted with large stroke.  She failed thrombectomy and has had a poor neurological recovery to date.  She had expressed clear wishes not to have her life prolonged with artificial means, and so family will remove her feeding tube today as we pursue inpatient hospice placement.        DVT prophylaxis: Comfort measures Code Status: DO NOT RESSUCITATE Family Communication: Husband, daughters at bedside    Consultants:   Palliative Care  Neurology    Subjective: Unable to assess.  No reports of change in mentation, swallowing from nursing>  No fever, respiratory distress.  Objective: Vitals:   01/13/19 1956 01/13/19 2318 01/14/19 0325 01/14/19 0831  BP: (!) 127/56 122/64 129/61 (!) 143/84  Pulse: 88 81 89 86  Resp: 18 20 20 16   Temp: 99.1 F (37.3 C) 98.9 F (37.2 C) 98.6 F (37 C) 98.7 F (37.1 C)  TempSrc: Oral Oral Oral Oral  SpO2: 99% 99% 98% 99%  Weight:   71.6 kg   Height:        Intake/Output Summary (Last 24 hours) at 01/14/2019 2025 Last data filed at 01/14/2019 1848 Gross per 24 hour  Intake 140 ml  Output 1175 ml  Net -1035 ml  Filed Weights   01/09/19 0409 01/12/19 0500 01/14/19 0325  Weight: 72.9 kg 72.1 kg 71.6 kg    Examination: General appearance:  adult female, unresponsive. NG tube in place.    HEENT: Anicteric, conjunctiva pink, lids and lashes normal. No nasal deformity, discharge, epistaxis.   Skin: Warm and dry.  no jaundice.  No suspicious rashes or lesions. Cardiac: RRR, nl S1-S2, no  murmurs appreciated.  Capillary refill is brisk. No LE edema Respiratory: Tachpyneic.  CTAB without rales or wheezes. Abdomen: Abdomen soft.  No grimace to palpation. No ascites, distension, hepatosplenomegaly.   MSK: No deformities or effusions. Neuro: No response to stimuli.  Right sided neglect.  Moves left side restlessly, right side flaccid  Psych: unable to assess.    Data Reviewed: I have personally reviewed following labs and imaging studies:  CBC: Recent Labs  Lab 01/09/19 0407 01/10/19 1001 01/11/19 0804 01/12/19 0821 01/13/19 0502  WBC 8.8 11.1* 10.3 9.0 7.0  HGB 8.5* 9.5* 9.7* 9.8* 8.9*  HCT 27.3* 30.7* 31.0* 31.8* 28.8*  MCV 96.8 97.8 98.4 100.0 100.7*  PLT 242 265 252 236 416   Basic Metabolic Panel: Recent Labs  Lab 01/09/19 0407 01/10/19 1001 01/11/19 0517 01/12/19 0437 01/13/19 0502  NA 142 144 144 148* 146*  K 3.3* 3.7 3.5 3.8 3.3*  CL 111 110 108 109 106  CO2 22 26 25 30  35*  GLUCOSE 260* 267* 267* 202* 127*  BUN 16 20 24* 30* 27*  CREATININE 0.66 0.72 0.76 0.72 0.79  CALCIUM 8.7* 8.9 8.8* 8.5* 8.3*   GFR: Estimated Creatinine Clearance: 55.6 mL/min (by C-G formula based on SCr of 0.79 mg/dL). Liver Function Tests: No results for input(s): AST, ALT, ALKPHOS, BILITOT, PROT, ALBUMIN in the last 168 hours. No results for input(s): LIPASE, AMYLASE in the last 168 hours. No results for input(s): AMMONIA in the last 168 hours. Coagulation Profile: No results for input(s): INR, PROTIME in the last 168 hours. Cardiac Enzymes: No results for input(s): CKTOTAL, CKMB, CKMBINDEX, TROPONINI in the last 168 hours. BNP (last 3 results) No results for input(s): PROBNP in the last 8760 hours. HbA1C: No results for input(s): HGBA1C in the last 72 hours. CBG: Recent Labs  Lab 01/13/19 1950 01/13/19 2330 01/14/19 0327 01/14/19 0826 01/14/19 1120  GLUCAP 137* 201* 164* 102* 171*   Lipid Profile: No results for input(s): CHOL, HDL, LDLCALC, TRIG,  CHOLHDL, LDLDIRECT in the last 72 hours. Thyroid Function Tests: No results for input(s): TSH, T4TOTAL, FREET4, T3FREE, THYROIDAB in the last 72 hours. Anemia Panel: No results for input(s): VITAMINB12, FOLATE, FERRITIN, TIBC, IRON, RETICCTPCT in the last 72 hours. Urine analysis:    Component Value Date/Time   COLORURINE YELLOW 01/10/2019 1654   APPEARANCEUR CLEAR 01/10/2019 1654   APPEARANCEUR Clear 08/15/2014 2334   LABSPEC 1.032 (H) 01/10/2019 1654   LABSPEC 1.002 08/15/2014 2334   PHURINE 5.0 01/10/2019 1654   GLUCOSEU >=500 (A) 01/10/2019 1654   GLUCOSEU >=500 08/15/2014 2334   HGBUR SMALL (A) 01/10/2019 1654   BILIRUBINUR NEGATIVE 01/10/2019 1654   BILIRUBINUR Negative 08/15/2014 2334   KETONESUR NEGATIVE 01/10/2019 1654   PROTEINUR 100 (A) 01/10/2019 1654   NITRITE NEGATIVE 01/10/2019 1654   LEUKOCYTESUR TRACE (A) 01/10/2019 1654   LEUKOCYTESUR Negative 08/15/2014 2334   Sepsis Labs: @LABRCNTIP (procalcitonin:4,lacticacidven:4)  ) Recent Results (from the past 240 hour(s))  MRSA PCR Screening     Status: None   Collection Time: 01/05/19  1:03 PM  Result Value Ref Range Status  MRSA by PCR NEGATIVE NEGATIVE Final    Comment:        The GeneXpert MRSA Assay (FDA approved for NASAL specimens only), is one component of a comprehensive MRSA colonization surveillance program. It is not intended to diagnose MRSA infection nor to guide or monitor treatment for MRSA infections. Performed at [facility], [place] [address] [place], [place] [address]          Radiology Studies: No results found.      Scheduled Meds: . chlorhexidine  15 mL Mouth Rinse BID  . fluticasone  2 spray Each Nare Daily  . gabapentin  100 mg Per Tube BID  . insulin glargine  15 Units Subcutaneous BID  . iopamidol  100 mL Intravenous Once  . ketorolac  15 mg Intravenous BID  . mouth rinse  15 mL Mouth Rinse BID  . sodium chloride flush  3 mL Intravenous Q12H   Continuous  Infusions: . sodium chloride       LOS: 9 days    Time spent: 25 minutes    Edwin Dada, MD [facility] 01/14/2019, 8:25 PM     Please page through [place]:  [email or web address] Password [email or web address] If 7PM-7AM, please contact night-coverage

## 2019-01-15 MED ORDER — ACETAMINOPHEN 650 MG RE SUPP: 650.0000 mg | RECTAL | 0 refills | Status: AC | PRN

## 2019-01-15 MED ORDER — POLYVINYL ALCOHOL 1.4 % OP SOLN: 1.0000 [drp] | Freq: Four times a day (QID) | OPHTHALMIC | 0 refills | Status: AC | PRN

## 2019-01-15 MED ORDER — KETOROLAC TROMETHAMINE 15 MG/ML IJ SOLN: 15.0000 mg | Freq: Two times a day (BID) | INTRAMUSCULAR | 0 refills | Status: AC

## 2019-01-15 MED ORDER — GLYCOPYRROLATE 0.2 MG/ML IJ SOLN: 0.2000 mg | INTRAMUSCULAR | 0 refills | Status: AC | PRN

## 2019-01-15 MED ORDER — BIOTENE DRY MOUTH MT LIQD: 15.0000 mL | OROMUCOSAL | 0 refills | Status: AC | PRN

## 2019-01-15 MED ORDER — KETOROLAC TROMETHAMINE 30 MG/ML IJ SOLN: 30.0000 mg | Freq: Four times a day (QID) | INTRAMUSCULAR | 0 refills | Status: AC | PRN

## 2019-01-15 MED ORDER — LORAZEPAM 2 MG/ML PO CONC: 0.5000 mg | ORAL | 0 refills | Status: AC | PRN

## 2019-01-15 MED ORDER — HALOPERIDOL LACTATE 2 MG/ML PO CONC: 0.5000 mg | ORAL | 0 refills | Status: AC | PRN

## 2019-01-15 NOTE — Progress Notes (Signed)
Palliative Medicine RN Note: Follow up from PA meeting yesterday. No SW or hospice notes yet regarding hospice referral.  I spoke with Lorretta Harp for [facility]. They are aware of the referral, but Marcene Brawn, liason for [facility] is handling it. I have left a message for her requesting update on referral status.  Marjie Skiff Carloyn Lahue, RN, BSN, [facility] Palliative Medicine Team 01/15/2019 10:17 AM Office [phone]

## 2019-01-15 NOTE — Progress Notes (Signed)
CSW reached back out to Hospice Referrals to ensure they had received the referral for patient as no one called CSW back yesterday. Referral has been received, and they are able to take the patient today. CSW alerted MD.  CSW to follow to facilitate discharge to hospice home today.  Laveda Abbe, [place] Clinical Social Worker [phone]

## 2019-01-15 NOTE — Progress Notes (Signed)
Palliative Medicine RN Note: Afternoon symptom check. Patient is non-responsive to voice. PAINAD 0. Note plan for hospice transfer today. If she does not transfer, plan for PMT to follow for continued symptom monitoring.  Marjie Skiff Teshawn Moan, RN, BSN, [facility] Palliative Medicine Team 01/15/2019 1:11 PM Office [phone]

## 2019-01-15 NOTE — Clinical Social Work Note (Signed)
Clinical Social Work Assessment  Patient Details  Name: Stacy [name] MRN: [id number] Date of Birth: Nov 23, 1936  Date of referral:  01/14/19               Reason for consult:  Facility Placement                Permission sought to share information with:  Facility Sport and exercise psychologist, Family Supports Permission granted to share information::  Yes, Verbal Permission Granted  Name::     Stage manager::  [facility]  Relationship::     Contact Information:     Housing/Transportation Living arrangements for the past 2 months:  [place] of Information:  Medical Team, Adult Children Patient Interpreter Needed:  None Criminal Activity/Legal Involvement Pertinent to Current Situation/Hospitalization:  No - Comment as needed Significant Relationships:  Adult Children, Spouse Lives with:  Self, Spouse Do you feel safe going back to the place where you live?  Yes Need for family participation in patient care:  Yes (Comment)  Care giving concerns:  Patient from home with spouse, but is at end of life. Patient's family wants to keep patient comfortable and close to home in [place].   Social Worker assessment / plan:  CSW alerted by Palliative NP that patient's family would like to pursue residential hospice, and hopeful for admission to the hospice house in [place]. CSW met with patient's daughter, Stacy [name], at bedside to discuss. Patient's daughter agreed and provided permission for CSW to fax out referral. CSW contacted Hospice home and faxed clinical referral. Hospice admissions to follow up with CSW.  Employment status:  Retired Forensic scientist:  Medicare PT Recommendations:  [place] / Referral to community resources:  [place]  Patient/Family's Response to care:  Patient's family agreeable to hospice home, hopeful for [facility]  Patient/Family's Understanding of and Emotional Response to  Diagnosis, Current Treatment, and Prognosis:  Patient's family aware that the patient is possibly nearing end of life at this time, as she has had a debilitating stroke and it not progressing well at this time. However, patient's spouse had the exact same thing happen a year ago and recovered after sending him to hospice, so family is hopeful that the patient will bounce back the same way that he did.   Emotional Assessment Appearance:  Appears stated age Attitude/Demeanor/Rapport:  Unable to Assess Affect (typically observed):  Unable to Assess Orientation:  Oriented to Self Alcohol / Substance use:  Not Applicable Psych involvement (Current and /or in the community):  No (Comment)  Discharge Needs  Concerns to be addressed:  Care Coordination Readmission within the last 30 days:  No Current discharge risk:  Physical Impairment, Dependent with Mobility, Terminally ill Barriers to Discharge:  No Barriers Identified   Geralynn Ochs, LCSW 01/15/2019, 12:33 PM

## 2019-01-15 NOTE — Progress Notes (Signed)
Report called to RN Santiago Glad at [facility] around 1300. Patient discharged from facility via [place].

## 2019-01-15 NOTE — Discharge Summary (Signed)
Physician Discharge Summary  Stacy [name] GQQ:[id number] DOB: 06/23/1936 DOA: 01/05/2019  PCP: Rusty Aus, MD  Admit date: 01/05/2019 Discharge date: 01/15/2019  Admitted From: Home  Disposition:  [place]: N/A  Equipment/Devices: N/A  Discharge Condition: Declining  CODE STATUS: DO NOT RESUSCITATE Diet recommendation: NPO  Brief/Interim Summary: Stacy [name] is a 83 y.o. F with HTN, DM, hypothyroidism, hx brCa, A. fib on warfarin who presented with right facial droop and aphasia. Underwent emergent mechanical thrombectomy and balloon angioplasty, unfortunately failed/reoccluded after 2 attempts, stent not deployed due to concurrent/adjacent subdural hematoma         PRINCIPAL HOSPITAL DIAGNOSIS: Acute left MCA ischemic stroke    Discharge Diagnoses:   Acute left MCA stroke, due to M1 occlusion The patient underwent mechanical thrombectomy twice and balloon angioplasty, which were unsuccessful, with reocclusion after revascularization.  Her stroke was complicated by an adjacent subdural hematoma that prevented stenting.  Stroke etiology thought to be from Afib in the setting of subtherapeutic INR.  Repeat CT head at day 4 showed an evolving left corona radiata infarct consistent with evolving subacute infarction.  She had a persistent dense right hemiplegia, global aphasia, and severe dysphagia.  These deficits were severe and persistent for her entire hospital stay, and the patient had few if any advances in ability to swallow (failed any response to food bolus in the mouth, which had to be suctioned out), failed to respond to simple one step commands with repeated attempts.   Palliative Care were consulted, and in discussion with family, the patient's wishes not to have a feeding tube, and to be allowed to have a natural death were expressed clearly.    Her feeding tube and IV fluids were discontinued.  Arrangements were made for transition  to inpatient Hospice.    PRN anxiolytics, anti-spasmodics and other symptomatic therapies were recommended as below.    Toradol for pain, patient has allergy to opiates.    Aspiration pneumonia Severe dysphagia The recurrent and terminal nature of aspiration was discussed with family, who are in understanding.  She received 4 days antibiotics, which were stopped at the time of removal of feeding tube.    Left subdural hematoma  Chronic atrial fibrillation  Diabetes  History of left breast cancer status post mastectomy  Elevated LFTs  Chronic normocytic anemia           Discharge Instructions   Allergies as of 01/15/2019      Reactions   Morphine And Related Anaphylaxis   Amiodarone Other (See Comments)   tremors   Codeine Other (See Comments)   Hallucinations   Lovaza [omega-3-acid Ethyl Esters] Diarrhea   Zaroxolyn [metolazone] Other (See Comments)   Ivp Dye [iodinated Diagnostic Agents] Rash   Penicillins Rash   Has patient had a PCN reaction causing immediate rash, facial/tongue/throat swelling, SOB or lightheadedness with hypotension: Yes Has patient had a PCN reaction causing severe rash involving mucus membranes or skin necrosis: No Has patient had a PCN reaction that required hospitalization: No Has patient had a PCN reaction occurring within the last 10 years: No If all of the above answers are "NO", then may proceed with Cephalosporin use.   Sulfa Antibiotics Rash      Medication List    STOP taking these medications   atorvastatin 20 MG tablet Commonly known as:  LIPITOR   calcium citrate-vitamin D 315-200 MG-UNIT tablet Commonly known as:  CITRACAL+D   cholecalciferol 1000  units tablet Commonly known as:  VITAMIN D   fluticasone 50 MCG/ACT nasal spray Commonly known as:  FLONASE   furosemide 40 MG tablet Commonly known as:  LASIX   gabapentin 300 MG capsule Commonly known as:  NEURONTIN   glimepiride 2 MG tablet Commonly  known as:  AMARYL   insulin glargine 100 UNIT/ML injection Commonly known as:  LANTUS   levothyroxine 75 MCG tablet Commonly known as:  SYNTHROID, LEVOTHROID   loperamide 2 MG capsule Commonly known as:  IMODIUM   magnesium oxide 400 MG tablet Commonly known as:  MAG-OX   metoprolol succinate 50 MG 24 hr tablet Commonly known as:  TOPROL-XL   multivitamin with minerals tablet   omega-3 acid ethyl esters 1 g capsule Commonly known as:  LOVAZA   omeprazole 40 MG capsule Commonly known as:  PRILOSEC   polyethylene glycol packet Commonly known as:  MIRALAX / GLYCOLAX   potassium chloride 8 MEQ tablet Commonly known as:  KLOR-CON   sucralfate 1 g tablet Commonly known as:  CARAFATE   vitamin C 1000 MG tablet   vitamin E 400 UNIT capsule   warfarin 5 MG tablet Commonly known as:  COUMADIN     TAKE these medications   acetaminophen 650 MG suppository Commonly known as:  TYLENOL Place 1 suppository (650 mg total) rectally every 4 (four) hours as needed for mild pain (or temp > 37.5 C (99.5 F)).   antiseptic oral rinse Liqd Apply 15 mLs topically as needed for dry mouth.   glycopyrrolate 0.2 MG/ML injection Commonly known as:  ROBINUL Inject 1 mL (0.2 mg total) into the skin every 4 (four) hours as needed (excessive secretions).   haloperidol 2 MG/ML solution Commonly known as:  HALDOL Place 0.3 mLs (0.6 mg total) under the tongue every 4 (four) hours as needed for agitation (or delirium).   ketorolac 15 MG/ML injection Commonly known as:  TORADOL Inject 1 mL (15 mg total) into the vein 2 (two) times daily.   ketorolac 30 MG/ML injection Commonly known as:  TORADOL Inject 1 mL (30 mg total) into the vein every 6 (six) hours as needed for moderate pain.   LORazepam 2 MG/ML concentrated solution Commonly known as:  ATIVAN Place 0.3 mLs (0.6 mg total) under the tongue every 4 (four) hours as needed for anxiety, seizure or sleep.   polyvinyl alcohol 1.4 %  ophthalmic solution Commonly known as:  LIQUIFILM TEARS Place 1 drop into both eyes 4 (four) times daily as needed for dry eyes.      Follow-up Information    [facility]. Schedule an appointment as soon as possible for a visit in 4 week(s).   Specialty:  Neurology Contact information: [place] [phone]         Allergies  Allergen Reactions  . Morphine And Related Anaphylaxis  . Amiodarone Other (See Comments)    tremors  . Codeine Other (See Comments)    Hallucinations   . Lovaza [Omega-3-Acid Ethyl Esters] Diarrhea  . Zaroxolyn [Metolazone] Other (See Comments)  . Ivp Dye [Iodinated Diagnostic Agents] Rash  . Penicillins Rash    Has patient had a PCN reaction causing immediate rash, facial/tongue/throat swelling, SOB or lightheadedness with hypotension: Yes Has patient had a PCN reaction causing severe rash involving mucus membranes or skin necrosis: No Has patient had a PCN reaction that required hospitalization: No Has patient had a PCN reaction occurring within the last 10  years: No If all of the above answers are "NO", then may proceed with Cephalosporin use.  . Sulfa Antibiotics Rash    Consultations:  Neurology  Palliative Care   Procedures/Studies: Ct Angio Head W Or Wo Contrast  Result Date: 01/05/2019 CLINICAL DATA:  Focal neuro deficit with stroke suspected EXAM: CT ANGIOGRAPHY HEAD AND NECK TECHNIQUE: Multidetector CT imaging of the head and neck was performed using the standard protocol during bolus administration of intravenous contrast. Multiplanar CT image reconstructions and MIPs were obtained to evaluate the vascular anatomy. Carotid stenosis measurements (when applicable) are obtained utilizing NASCET criteria, using the distal internal carotid diameter as the denominator. CONTRAST:  54m ISOVUE-370 IOPAMIDOL (ISOVUE-370) INJECTION 76% COMPARISON:  Noncontrast head CT from  earlier today FINDINGS: CTA NECK FINDINGS Aortic arch: Atherosclerosis.  Three vessel branching. Right carotid system: Mild atherosclerotic plaque. No stenosis or ulceration. Left carotid system: Mild atherosclerotic plaque. No stenosis or ulceration. Vertebral arteries: Proximal subclavian atherosclerosis without flow limiting stenosis. A tubal tortuosity. Both vertebral arteries are widely patent to the dura. Skeleton: Negative Other neck: No acute finding. Upper chest: Layering right pleural effusion. Review of the MIP images confirms the above findings CTA HEAD FINDINGS Anterior circulation: Left M1 to M2 occlusion with acute appearance causing a proximal meniscus sign. There is downstream under filled reconstitution vessels. Atherosclerotic calcification of the carotid siphons. Posterior circulation: Symmetric vertebral arteries. The vertebral and basilar arteries are smooth and widely patent. No branch occlusion or flow limiting stenosis. Venous sinuses: Patent as permitted by contrast timing Anatomic variants: None significant. Delayed phase: No abnormal intracranial enhancement. Known mixed density subdural hematoma along the left cerebral convexity. Review of the MIP images confirms the above findings Critical Value/emergent results were called by telephone at the time of interpretation on 01/05/2019 at 7:53 am to Dr. DDelora Fuel, who verbally acknowledged these results. IMPRESSION: 1. 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 There is underfilling of the reconstituted downstream vessels. 2. Mixed density subdural hematoma along the left cerebral convexity, see preceding noncontrast head CT. 3. Limited atherosclerosis for age. No flow limiting stenosis or ulceration. Electronically Signed   By: JMonte FantasiaM.D.   On: 01/05/2019 07:59   Dg Chest 2 View  Result Date: 01/11/2019 CLINICAL DATA:  83year old female with hypoxia. History of stroke. Subsequent encounter. EXAM: CHEST - 2 VIEW  COMPARISON:  01/10/2019 and 01/05/2019 chest x-ray FINDINGS: As best appreciated on lateral view, increase consolidation involving the lung bases greater on the right. This may represent pleural fluid with basilar atelectasis although lower lobe infiltrate, particularly on the right, can not be excluded. Mild pulmonary vascular congestion/mild pulmonary edema. Cardiomegaly with sequential pacemaker in place. Post left breast surgery and axillary lymph node dissection. Feeding tube in place, tip not imaged on frontal view. Calcified aorta. IMPRESSION: 1. Increased consolidation involving lung bases greater on right. This may represent pleural fluid with atelectasis although lower lobe infiltrate, particularly on the right, could not be excluded in proper clinical setting. 2. Pulmonary vascular congestion/mild pulmonary edema. 3. Cardiomegaly with pacemaker in place 4.  Aortic Atherosclerosis (ICD10-I70.0). Electronically Signed   By: SGenia DelM.D.   On: 01/11/2019 08:01   Dg Chest 2 View  Result Date: 01/05/2019 CLINICAL DATA:  Initial evaluation for acute trauma, fall. EXAM: CHEST - 2 VIEW COMPARISON:  Prior radiograph from 09/02/2018 FINDINGS: There has been interval placement of a triple lead transvenous pacemaker/AICD. Cardiomegaly is relatively stable. Mediastinal silhouette within normal limits. Aortic atherosclerosis.  Lungs hypoinflated. No focal infiltrates. No pulmonary edema. Small layering right pleural effusion seen posteriorly on lateral projection. No pneumothorax. No acute osseous finding. Multiple surgical clips overlie the left axilla. IMPRESSION: 1. Small layering right pleural effusion. 2. No other active cardiopulmonary disease. Electronically Signed   By: Jeannine Boga M.D.   On: 01/05/2019 06:15   Dg Clavicle Left  Result Date: 01/05/2019 CLINICAL DATA:  Initial evaluation for acute left clavicular pain status post fall. EXAM: LEFT CLAVICLE - 2+ VIEWS COMPARISON:  None.  FINDINGS: No acute fracture or dislocation. Osteoarthritic changes present at the left [facility] joint. Pacemaker electrodes partially overlie the clavicle. Visualized left lung apex clear. No soft tissue abnormality. IMPRESSION: No acute osseous abnormality about the left clavicle. Electronically Signed   By: Jeannine Boga M.D.   On: 01/05/2019 06:17   Ct Head Wo Contrast  Result Date: 01/11/2019 CLINICAL DATA:  Stroke follow-up EXAM: CT HEAD WITHOUT CONTRAST TECHNIQUE: Contiguous axial images were obtained from the base of the skull through the vertex without intravenous contrast. COMPARISON:  01/07/2019 FINDINGS: Brain: Low-density left subdural hematoma is unchanged in size and appearance. There is periventricular hypoattenuation compatible with chronic microvascular disease. There is progressive hypoattenuation within the left corona radiata. No intraparenchymal hemorrhage. No midline shift or other mass effect. Vascular: No abnormal hyperdensity of the major intracranial arteries or dural venous sinuses. No intracranial atherosclerosis. Skull: The visualized skull base, calvarium and extracranial soft tissues are normal. Sinuses/Orbits: No fluid levels or advanced mucosal thickening of the visualized paranasal sinuses. No mastoid or middle ear effusion. The orbits are normal. IMPRESSION: 1. Unchanged size and appearance of low-density left subdural hematoma. 2. Worsening hypoattenuation within the left corona radiata, consistent with evolving subacute infarct. 3. No midline shift or other mass effect. Electronically Signed   By: Ulyses Jarred M.D.   On: 01/11/2019 01:14   Ct Head Wo Contrast  Addendum Date: 01/07/2019   ADDENDUM REPORT: 01/07/2019 04:46 ADDENDUM: Correction: FINDINGS: Similar to decreased 8 mm LEFT (not RIGHT) holo hemispheric mixed density subdural hematoma. Electronically Signed   By: Elon Alas M.D.   On: 01/07/2019 04:46   Result Date: 01/07/2019 CLINICAL DATA:  Follow up  stroke. Status post endovascular revascularization of LEFT MCA occlusion. EXAM: CT HEAD WITHOUT CONTRAST TECHNIQUE: Contiguous axial images were obtained from the base of the skull through the vertex without intravenous contrast. COMPARISON:  None. FINDINGS: BRAIN: No intraparenchymal hemorrhage, mass effect nor midline shift. The ventricles and sulci are normal for age. Patchy supratentorial white matter hypodensities within normal range for patient's age, though non-specific are most compatible with chronic small vessel ischemic disease. No acute large vascular territory infarcts. Similar to decreased 8 mm RIGHT holo hemispheric mixed density subdural hematoma. VASCULAR: Moderate calcific atherosclerosis of the carotid siphons. Asymmetrically dense LEFT MCA best appreciated on sagittal series. The SKULL: No skull fracture. No significant scalp soft tissue swelling. SINUSES/ORBITS: Paranasal sinuses are well aerated. Trace bilateral mastoid effusions. Included ocular globes and orbital contents are non-suspicious. Status post bilateral ocular lens implants. OTHER: None. IMPRESSION: 1. Dense LEFT MCA: Recurrent occlusion possible versus enhancing residual thrombus. 2. No acute infarct. 3. Similar to decreased mixed density LEFT holo hemispheric subdural hematoma. 4. These results will be called to the ordering clinician or representative by the professional radiologist assistant, and communication documented in [employer]. Electronically Signed: By: Elon Alas M.D. On: 01/06/2019 05:14   Ct Head Wo Contrast  Result Date: 01/07/2019 CLINICAL DATA:  Follow up  stroke. Status post endovascular revascularization of LEFT MCA occlusion history of breast cancer. EXAM: CT HEAD WITHOUT CONTRAST TECHNIQUE: Contiguous axial images were obtained from the base of the skull through the vertex without intravenous contrast. COMPARISON:  CT HEAD January 06, 2018. FINDINGS: BRAIN: No intraparenchymal hemorrhage, mass  effect nor midline shift. The ventricles and sulci are normal for age. Patchy supratentorial white matter hypodensities compatible with chronic small vessel ischemic changes. No acute large vascular territory infarcts. Stable 8 mm mixed density LEFT holo hemispheric subdural hematoma. Basal cisterns are patent. VASCULAR: Moderate calcific atherosclerosis of the carotid siphons. SKULL: No skull fracture. No significant scalp soft tissue swelling. SINUSES/ORBITS: Trace paranasal sinus mucosal thickening. Minimal mastoid effusions. Included ocular globes and orbital contents are non-suspicious. Status post bilateral ocular lens implants. OTHER: None. IMPRESSION: 1. No acute intracranial process. 2. Stable 8 mm mixed density LEFT holo hemispheric subdural hematoma. Electronically Signed   By: Elon Alas M.D.   On: 01/07/2019 04:45   Ct Head Wo Contrast  Result Date: 01/05/2019 CLINICAL DATA:  83 year old female fell off toilet tonight. Garbled speech and mild facial droop. Initial encounter. EXAM: CT HEAD WITHOUT CONTRAST CT CERVICAL SPINE WITHOUT CONTRAST TECHNIQUE: Multidetector CT imaging of the head and cervical spine was performed following the standard protocol without intravenous contrast. Multiplanar CT image reconstructions of the cervical spine were also generated. COMPARISON:  None. FINDINGS: CT HEAD FINDINGS Brain: Complex broad-base left convexity subdural hematoma. Largest component appears hypodense (possibly chronic) measuring 10.1 mm maximal thickness. More acute appearing component (which is slightly hyperdense) measures up to 4.3 mm. Mild local mass effect upon adjacent frontal-parietal gyri. Minimal bowing of the septum to the right. Thin left tentorial subdural hematoma. Tubular hyperdensity along the course of the left middle cerebral artery M1 segment may be related to thrombus or possibly tiny amount of blood. Chronic microvascular changes. Global atrophy. Vascular: As above. Skull: No  skull fracture noted. Sinuses/Orbits: No acute orbital abnormality. Visualized paranasal sinuses are clear. Other: Mastoid air cells and middle ear cavities are clear. CT CERVICAL SPINE FINDINGS Alignment: Slight scoliosis convex left. Minimal anterior slip C4 secondary to right-sided facet degenerative changes. Skull base and vertebrae: No cervical spine fracture. Nonspecific 7 mm right C4 and C6 vertebral body lucency Soft tissues and spinal canal: No abnormal prevertebral soft tissue swelling. Disc levels: Scattered cervical spondylotic changes, most notable C6-7. No high-grade spinal stenosis. Upper chest: No worrisome abnormality Other: No worrisome abnormality IMPRESSION: CT HEAD: 1. Complex broad-base left convexity subdural hematoma. Largest component appears hypodense (possibly chronic) measuring 10.1 mm maximal thickness. More acute appearing component (which is slightly hyperdense) measures up to 4.3 mm. Mild local mass effect upon adjacent frontal-parietal gyri. Minimal bowing of the septum to the right. No skull fracture. 2. Thin left tentorial subdural hematoma. 3. Tubular hyperdensity along the course of the left middle cerebral artery M1 segment may be related to thrombus or possibly tiny amount of blood. 4. Chronic microvascular changes. 5. Global atrophy. CT CERVICAL SPINE: 1. Slight scoliosis convex left. Minimal anterior slip C4 secondary to right-sided facet degenerative changes. 2. No cervical spine fracture noted. No abnormal prevertebral soft tissue swelling. 3. Nonspecific 7 mm right C4 and C6 vertebral body lucency. These results were called by telephone at the time of interpretation on 01/05/2019 at 6:01 am to Dr. Delora Fuel , who verbally acknowledged these results. Electronically Signed   By: Genia Del M.D.   On: 01/05/2019 06:19   Ct Angio Neck W  And/or Wo Contrast  Result Date: 01/05/2019 CLINICAL DATA:  Focal neuro deficit with stroke suspected EXAM: CT ANGIOGRAPHY HEAD AND  NECK TECHNIQUE: Multidetector CT imaging of the head and neck was performed using the standard protocol during bolus administration of intravenous contrast. Multiplanar CT image reconstructions and MIPs were obtained to evaluate the vascular anatomy. Carotid stenosis measurements (when applicable) are obtained utilizing NASCET criteria, using the distal internal carotid diameter as the denominator. CONTRAST:  26m ISOVUE-370 IOPAMIDOL (ISOVUE-370) INJECTION 76% COMPARISON:  Noncontrast head CT from earlier today FINDINGS: CTA NECK FINDINGS Aortic arch: Atherosclerosis.  Three vessel branching. Right carotid system: Mild atherosclerotic plaque. No stenosis or ulceration. Left carotid system: Mild atherosclerotic plaque. No stenosis or ulceration. Vertebral arteries: Proximal subclavian atherosclerosis without flow limiting stenosis. A tubal tortuosity. Both vertebral arteries are widely patent to the dura. Skeleton: Negative Other neck: No acute finding. Upper chest: Layering right pleural effusion. Review of the MIP images confirms the above findings CTA HEAD FINDINGS Anterior circulation: Left M1 to M2 occlusion with acute appearance causing a proximal meniscus sign. There is downstream under filled reconstitution vessels. Atherosclerotic calcification of the carotid siphons. Posterior circulation: Symmetric vertebral arteries. The vertebral and basilar arteries are smooth and widely patent. No branch occlusion or flow limiting stenosis. Venous sinuses: Patent as permitted by contrast timing Anatomic variants: None significant. Delayed phase: No abnormal intracranial enhancement. Known mixed density subdural hematoma along the left cerebral convexity. Review of the MIP images confirms the above findings Critical Value/emergent results were called by telephone at the time of interpretation on 01/05/2019 at 7:53 am to Dr. DDelora Fuel, who verbally acknowledged these results. IMPRESSION: 1. Emergent large vessel  occlusion from left M1 embolism. There is underfilling of the reconstituted downstream vessels. 2. Mixed density subdural hematoma along the left cerebral convexity, see preceding noncontrast head CT. 3. Limited atherosclerosis for age. No flow limiting stenosis or ulceration. Electronically Signed   By: JMonte FantasiaM.D.   On: 01/05/2019 07:59   Ct Cervical Spine Wo Contrast  Result Date: 01/05/2019 CLINICAL DATA:  83year old female fell off toilet tonight. Garbled speech and mild facial droop. Initial encounter. EXAM: CT HEAD WITHOUT CONTRAST CT CERVICAL SPINE WITHOUT CONTRAST TECHNIQUE: Multidetector CT imaging of the head and cervical spine was performed following the standard protocol without intravenous contrast. Multiplanar CT image reconstructions of the cervical spine were also generated. COMPARISON:  None. FINDINGS: CT HEAD FINDINGS Brain: Complex broad-base left convexity subdural hematoma. Largest component appears hypodense (possibly chronic) measuring 10.1 mm maximal thickness. More acute appearing component (which is slightly hyperdense) measures up to 4.3 mm. Mild local mass effect upon adjacent frontal-parietal gyri. Minimal bowing of the septum to the right. Thin left tentorial subdural hematoma. Tubular hyperdensity along the course of the left middle cerebral artery M1 segment may be related to thrombus or possibly tiny amount of blood. Chronic microvascular changes. Global atrophy. Vascular: As above. Skull: No skull fracture noted. Sinuses/Orbits: No acute orbital abnormality. Visualized paranasal sinuses are clear. Other: Mastoid air cells and middle ear cavities are clear. CT CERVICAL SPINE FINDINGS Alignment: Slight scoliosis convex left. Minimal anterior slip C4 secondary to right-sided facet degenerative changes. Skull base and vertebrae: No cervical spine fracture. Nonspecific 7 mm right C4 and C6 vertebral body lucency Soft tissues and spinal canal: No abnormal prevertebral soft  tissue swelling. Disc levels: Scattered cervical spondylotic changes, most notable C6-7. No high-grade spinal stenosis. Upper chest: No worrisome abnormality Other: No worrisome abnormality IMPRESSION: CT HEAD:  1. Complex broad-base left convexity subdural hematoma. Largest component appears hypodense (possibly chronic) measuring 10.1 mm maximal thickness. More acute appearing component (which is slightly hyperdense) measures up to 4.3 mm. Mild local mass effect upon adjacent frontal-parietal gyri. Minimal bowing of the septum to the right. No skull fracture. 2. Thin left tentorial subdural hematoma. 3. Tubular hyperdensity along the course of the left middle cerebral artery M1 segment may be related to thrombus or possibly tiny amount of blood. 4. Chronic microvascular changes. 5. Global atrophy. CT CERVICAL SPINE: 1. Slight scoliosis convex left. Minimal anterior slip C4 secondary to right-sided facet degenerative changes. 2. No cervical spine fracture noted. No abnormal prevertebral soft tissue swelling. 3. Nonspecific 7 mm right C4 and C6 vertebral body lucency. These results were called by telephone at the time of interpretation on 01/05/2019 at 6:01 am to Dr. Delora Fuel , who verbally acknowledged these results. Electronically Signed   By: Genia Del M.D.   On: 01/05/2019 06:19   [facility] Intracranial  Result Date: 01/08/2019 INDICATION: New onset dysphagia, right-sided weakness and left gaze deviation. 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 EXAM: 1. EMERGENT LARGE VESSEL OCCLUSION THROMBOLYSIS (anterior CIRCULATION) 2. Intracranial balloon angioplasty of the left middle cerebral artery. COMPARISON:  CT angiogram of the head and neck of 01/05/2019. MEDICATIONS: Vancomycin 1 g IV antibiotic was administered within 1 hour of the procedure. ANESTHESIA/SEDATION: General anesthesia. CONTRAST:  Isovue 300 approximately 120 cc. FLUOROSCOPY TIME:  Fluoroscopy Time: 83 minutes 12 seconds  (3019 mGy). COMPLICATIONS: None immediate. TECHNIQUE: Following a full explanation of the procedure along with the potential associated complications, an informed witnessed consent was obtained from the patient's daughter and husband. The risks of intracranial hemorrhage of 10%, worsening neurological deficit, ventilator dependency, death and inability to revascularize were all reviewed in detail with the patient's daughter and husband. The patient was then put under general anesthesia by the Department of Anesthesiology at [facility]. The right groin was prepped and draped in the usual sterile fashion. Thereafter using modified Seldinger technique, transfemoral access into the right common femoral artery was obtained without difficulty. Over a 0.035 inch guidewire a 5 French Pinnacle sheath was inserted. Through this, and also over a 0.035 inch guidewire a 5 [place] JB 1 catheter was advanced to the aortic arch region and selectively positioned in the left common carotid artery. FINDINGS: The left common carotid arteriogram demonstrates the left external carotid artery and its major branches to be widely patent. The left internal carotid artery at the bulb to the cranial skull base demonstrates wide patency with mild tortuosity proximally. The petrous, cavernous and supraclinoid segments are widely patent. The left middle cerebral artery demonstrates complete angiographic occlusion in the mid M1 segment at the origin of the anterior temporal branch. The left anterior cerebral artery is seen to opacify into the capillary and venous phases. The delayed arterial phase demonstrates retrograde opacification of the distal left MCA distribution from the pericallosal and callosal marginal branches. Retrograde opacification is noted into the M3 regions of the perisylvian branches. PROCEDURE: ENDOVASCULAR REVASCULARIZATION OF OCCLUDED LEFT MIDDLE CEREBRAL ARTERY M1 SEGMENT WITH MECHANICAL THROMBECTOMY, AND ALSO  BALLOON ANGIOPLASTY FOR RECURRENT OCCLUSION. The diagnostic JB 1 catheter in the left common carotid artery was exchanged over a 0.035 inch 300 cm Rosen exchange guidewire for an 8 French 55 cm Brite tip neurovascular sheath using biplane roadmap technique and constant fluoroscopic guidance. Good aspiration was obtained from the side port of the  neurovascular sheath. This was then connected to continuous heparinized saline infusion. Over the [employer] guidewire, an 8 [place] 85 cm balloon FlowGate guide catheter which had been prepped with 50% contrast and 50% heparinized saline infusion was advanced and positioned in the mid cervical left ICA. The guidewire was removed. Good aspiration obtained from the hub of the [facility] guide catheter. A gentle control arteriogram performed through [facility] guide catheter in the left internal carotid artery demonstrated no evidence of spasms, dissections or of intraluminal filling defects. No change was seen in the left MCA occlusion. In a coaxial manner and with constant heparinized saline infusion using biplane roadmap technique and constant fluoroscopic guidance, a combination of a 6 French 132 cm Catalyst guide catheter inside of which was an 021 Trevo ProVue microcatheter was advanced over a 0.014 inch Softip Synchro micro guidewire to the distal end of the [facility] guide catheter in the left internal carotid artery. With the micro guidewire leading with a J-tip configuration, the combination was navigated to the supraclinoid left ICA. The occluded left middle cerebral artery was then crossed with the micro guidewire without difficulty into the inferior division M2 M3 region followed by the microcatheter. The guidewire was removed. Good aspiration was obtained from the hub of the microcatheter. This was then connected to continuous heparinized saline infusion. A 33 mm x 5 mm Embotrap retrieval device was then advanced into the distal end of the microcatheter. The  proximal and the distal landing zones were then defined. The O ring on the delivery microcatheter was then loosened. With slight forward traction with the right hand on the delivery micro guidewire, with the left hand the delivery microcatheter was retrieved unsheathing the distal and then the proximal portion of the retrieval device. A gentle control arteriogram performed through Catalyst guide catheter in the supraclinoid left ICA demonstrated a revascularization. With proximal flow arrest in the left internal carotid artery by inflating the balloon of the [facility] guide catheter, the combination of the retrieval device, the microcatheter and the 6 [place] Catalyst guide catheter were retrieved and removed as aspiration was continued as the balloon was deflated in the left internal carotid artery. Aspiration was performed with a 60 mL syringe at the hub of the [facility] guide catheter, and with a Prenumbra aspiration device the hub of the 6 [place] Catalyst guide catheter. Free back bleed of blood was noted at the hub of the [facility] guide catheter. A gentle control arteriogram performed through the [facility] guide catheter in the left internal carotid artery demonstrated no change in the occluded left middle cerebral artery. No evidence of clots or debris was found in the retrieval device or the aspirate. Two more attempts at mechanical thrombectomy were then made. A second attempt was again using the combination as described above. Again after having established safe position of tip of the microcatheter and placement of the 5 mm x 33 mm Embotrap retrieval device, with proximal flow arrest in the left internal carotid, and aspiration at the hub of the [facility] guide catheter with a 60 mL syringe, and with a Penumbra aspiration catheter at the hub of the 6 [place] Catalyst guide catheter, again with proximal flow arrest, the combination was retrieved and removed without evidence of clots in the aspirate or the  retrieval device. A control arteriogram performed following this continued to demonstrate occluded left middle cerebral artery. A third attempt was made with using a combination of a 7 [place] Catalyst guide catheter inside of which was an 021  Trevo ProVue microcatheter again advanced over a 0.014 inch Softip Synchro micro guidewire to the distal end of the supraclinoid left ICA. Again access was obtained with the micro guidewire into M2 M3 region of the dominant inferior division followed by the microcatheter. After having established safe position of the tip of the microcatheter, and connected to heparinized saline infusion, a 4 mm x 40 mm Solitaire FR retrieval device was then advanced and deployed in a predetermined position. Again a control arteriogram performed through the Catalyst guide catheter in the supraclinoid left ICA continued to demonstrate a TICI 2b revascularization while the device was opened. Thereafter with proximal flow arrest, the combination of the retrieval device, the microcatheter and the 7 [place] Catalyst guide catheter was retrieved and removed as aspiration was applied with a 60 mL syringe at the hub of the [facility] guide catheter, and with a Penumbra device at the hub of the 7 [place] Catalyst guide catheter the combination was retrieved and removed. Back bleed was allowed after reversal of aspiration. At this time there were a few flecks of whitish material noted in the aspirate. A control arteriogram performed through the [facility] guide catheter in the left internal carotid artery demonstrated continued complete occlusion of the left middle cerebral artery unchanged. It was felt that the underlying pathology was probably arteriosclerotic disease with significant stenosis. Placement of a stent across the tight occlusion of the left middle cerebral artery was felt to be potentially riskier in terms of post reperfusion hemorrhage and also with the patient having to be given dual  antiplatelets. With the patient already being on warfarin, and also a history of subdural hematoma overlying the cerebral convexity on the left side, it was felt to proceed with intracranial angioplasty. A Gateway 1.5 mm x 15 mm balloon was then prepped and purged with 50% contrast and 50% heparinized saline infusion. This was then advanced inside of an intermediary catheter to the supraclinoid left ICA over a 0.014 inch Softip Synchro micro guidewire. The micro guidewire was then advanced through the occluded left middle cerebral artery followed by the placement of the balloon. An angioplasty was then performed with a micro inflation syringe device via micro tubing inflating the balloon to just over 6 atmospheres achieving a 1.6 mm diameter where a balloon was maintained for approximately a minute and a half. It was then deflated and retrieved slightly proximally with the wire maintained distally. A control arteriogram performed demonstrated now significantly improved caliber and flow through the angioplastied segment with a TICI 2b revascularization. An angiogram performed 3 minutes later demonstrated progressive worsening of the angioplastied segment prompting a second angioplasty again with the balloon being inflated gradually to 6.1 atmospheres achieving a diameter of just over 1.6 mm where it was maintained for approximately a minute. The balloon was then deflated and retrieved proximally. A control arteriogram performed through the intermediary catheter in the left internal carotid artery demonstrated again significantly improved caliber and flow through the angioplastied segment of the left middle cerebral artery. A TICI 2b revascularization was established. A control arteriogram performed approximately 5 minutes after this again demonstrated complete occlusion of the left middle cerebral artery. At this time it was decided to stop as placement of the stent was not an option as per discussion above. The  whole head arteriogram performed demonstrated no change in the previously noted robust collaterals from the anterior cerebral artery distribution. Throughout the procedure, the patient's blood pressure and neurological status remained stable. No angiographic evidence of extravasation  or mass-effect or midline shift was noted. A CT of the head performed on the table demonstrated no gross mass effect, midline shift or of intracranial hemorrhage. Stability of the previously noted subdural hematoma was seen. The 8 [place] FlowGate guide catheter and the 8 French neurovascular sheath were then retrieved over a J-wire and replaced with an [place] sheath. This in turn was then removed with successful hemostasis with the an 8 [place] Angio-Seal device. Distal pulses remained Dopplerable in the dorsalis pedis, and posterior regions bilaterally. The patient's general anesthesia was then reversed and the patient was extubated. Upon extubation the patient was able to maintain adequate oxygenation. She was able to move to some degree her left-side spontaneously. No significant motion was noted in the right upper or right lower extremity. Her pupils were 2 mm and equal. She was then transferred to the neuro ICU for further workup. IMPRESSION: Status post endovascular revascularization of occluded left middle cerebral artery with 2 passes with the 5 mm x 33 mm Embotrap retrieval device, and 1 pass with the 4 mm x 40 mm retrieval device, achieving a TICI 2b revascularization with subsequent reocclusion. Balloon angioplasty of occluded left middle cerebral artery probably due to underlying severe stenosis due to atherosclerosis with 2 passes again achieving a TICI 2b revascularization with subsequent reocclusion. PLAN: Follow-up outpatient clinic 4 weeks post discharge. Electronically Signed   By: Luanne Bras M.D.   On: 01/06/2019 13:41   [place]  Result Date: 01/08/2019 INDICATION: New onset dysphagia,  right-sided weakness and left gaze deviation. 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 EXAM: 1. EMERGENT LARGE VESSEL OCCLUSION THROMBOLYSIS (anterior CIRCULATION) 2. Intracranial balloon angioplasty of the left middle cerebral artery. COMPARISON:  CT angiogram of the head and neck of 01/05/2019. MEDICATIONS: Vancomycin 1 g IV antibiotic was administered within 1 hour of the procedure. ANESTHESIA/SEDATION: General anesthesia. CONTRAST:  Isovue 300 approximately 120 cc. FLUOROSCOPY TIME:  Fluoroscopy Time: 83 minutes 12 seconds (3019 mGy). COMPLICATIONS: None immediate. TECHNIQUE: Following a full explanation of the procedure along with the potential associated complications, an informed witnessed consent was obtained from the patient's daughter and husband. The risks of intracranial hemorrhage of 10%, worsening neurological deficit, ventilator dependency, death and inability to revascularize were all reviewed in detail with the patient's daughter and husband. The patient was then put under general anesthesia by the Department of Anesthesiology at [facility]. The right groin was prepped and draped in the usual sterile fashion. Thereafter using modified Seldinger technique, transfemoral access into the right common femoral artery was obtained without difficulty. Over a 0.035 inch guidewire a 5 French Pinnacle sheath was inserted. Through this, and also over a 0.035 inch guidewire a 5 [place] JB 1 catheter was advanced to the aortic arch region and selectively positioned in the left common carotid artery. FINDINGS: The left common carotid arteriogram demonstrates the left external carotid artery and its major branches to be widely patent. The left internal carotid artery at the bulb to the cranial skull base demonstrates wide patency with mild tortuosity proximally. The petrous, cavernous and supraclinoid segments are widely patent. The left middle cerebral artery demonstrates complete  angiographic occlusion in the mid M1 segment at the origin of the anterior temporal branch. The left anterior cerebral artery is seen to opacify into the capillary and venous phases. The delayed arterial phase demonstrates retrograde opacification of the distal left MCA distribution from the pericallosal and callosal marginal branches. Retrograde opacification  is noted into the M3 regions of the perisylvian branches. PROCEDURE: ENDOVASCULAR REVASCULARIZATION OF OCCLUDED LEFT MIDDLE CEREBRAL ARTERY M1 SEGMENT WITH MECHANICAL THROMBECTOMY, AND ALSO BALLOON ANGIOPLASTY FOR RECURRENT OCCLUSION. The diagnostic JB 1 catheter in the left common carotid artery was exchanged over a 0.035 inch 300 cm Rosen exchange guidewire for an 8 [place] 55 cm Brite tip neurovascular sheath using biplane roadmap technique and constant fluoroscopic guidance. Good aspiration was obtained from the side port of the neurovascular sheath. This was then connected to continuous heparinized saline infusion. Over the [employer] guidewire, an 8 [place] 85 cm balloon FlowGate guide catheter which had been prepped with 50% contrast and 50% heparinized saline infusion was advanced and positioned in the mid cervical left ICA. The guidewire was removed. Good aspiration obtained from the hub of the [facility] guide catheter. A gentle control arteriogram performed through [facility] guide catheter in the left internal carotid artery demonstrated no evidence of spasms, dissections or of intraluminal filling defects. No change was seen in the left MCA occlusion. In a coaxial manner and with constant heparinized saline infusion using biplane roadmap technique and constant fluoroscopic guidance, a combination of a 6 French 132 cm Catalyst guide catheter inside of which was an 021 Trevo ProVue microcatheter was advanced over a 0.014 inch Softip Synchro micro guidewire to the distal end of the [facility] guide catheter in the left internal carotid artery. With the  micro guidewire leading with a J-tip configuration, the combination was navigated to the supraclinoid left ICA. The occluded left middle cerebral artery was then crossed with the micro guidewire without difficulty into the inferior division M2 M3 region followed by the microcatheter. The guidewire was removed. Good aspiration was obtained from the hub of the microcatheter. This was then connected to continuous heparinized saline infusion. A 33 mm x 5 mm Embotrap retrieval device was then advanced into the distal end of the microcatheter. The proximal and the distal landing zones were then defined. The O ring on the delivery microcatheter was then loosened. With slight forward traction with the right hand on the delivery micro guidewire, with the left hand the delivery microcatheter was retrieved unsheathing the distal and then the proximal portion of the retrieval device. A gentle control arteriogram performed through Catalyst guide catheter in the supraclinoid left ICA demonstrated a revascularization. With proximal flow arrest in the left internal carotid artery by inflating the balloon of the [facility] guide catheter, the combination of the retrieval device, the microcatheter and the 6 [place] Catalyst guide catheter were retrieved and removed as aspiration was continued as the balloon was deflated in the left internal carotid artery. Aspiration was performed with a 60 mL syringe at the hub of the [facility] guide catheter, and with a Prenumbra aspiration device the hub of the 6 [place] Catalyst guide catheter. Free back bleed of blood was noted at the hub of the [facility] guide catheter. A gentle control arteriogram performed through the [facility] guide catheter in the left internal carotid artery demonstrated no change in the occluded left middle cerebral artery. No evidence of clots or debris was found in the retrieval device or the aspirate. Two more attempts at mechanical thrombectomy were then made. A second  attempt was again using the combination as described above. Again after having established safe position of tip of the microcatheter and placement of the 5 mm x 33 mm Embotrap retrieval device, with proximal flow arrest in the left internal carotid, and aspiration at the hub of the [facility]  guide catheter with a 60 mL syringe, and with a Penumbra aspiration catheter at the hub of the 6 [place] Catalyst guide catheter, again with proximal flow arrest, the combination was retrieved and removed without evidence of clots in the aspirate or the retrieval device. A control arteriogram performed following this continued to demonstrate occluded left middle cerebral artery. A third attempt was made with using a combination of a 7 [place] Catalyst guide catheter inside of which was an 021 Trevo ProVue microcatheter again advanced over a 0.014 inch Softip Synchro micro guidewire to the distal end of the supraclinoid left ICA. Again access was obtained with the micro guidewire into M2 M3 region of the dominant inferior division followed by the microcatheter. After having established safe position of the tip of the microcatheter, and connected to heparinized saline infusion, a 4 mm x 40 mm Solitaire FR retrieval device was then advanced and deployed in a predetermined position. Again a control arteriogram performed through the Catalyst guide catheter in the supraclinoid left ICA continued to demonstrate a TICI 2b revascularization while the device was opened. Thereafter with proximal flow arrest, the combination of the retrieval device, the microcatheter and the 7 [place] Catalyst guide catheter was retrieved and removed as aspiration was applied with a 60 mL syringe at the hub of the [facility] guide catheter, and with a Penumbra device at the hub of the 7 [place] Catalyst guide catheter the combination was retrieved and removed. Back bleed was allowed after reversal of aspiration. At this time there were a few flecks of whitish  material noted in the aspirate. A control arteriogram performed through the [facility] guide catheter in the left internal carotid artery demonstrated continued complete occlusion of the left middle cerebral artery unchanged. It was felt that the underlying pathology was probably arteriosclerotic disease with significant stenosis. Placement of a stent across the tight occlusion of the left middle cerebral artery was felt to be potentially riskier in terms of post reperfusion hemorrhage and also with the patient having to be given dual antiplatelets. With the patient already being on warfarin, and also a history of subdural hematoma overlying the cerebral convexity on the left side, it was felt to proceed with intracranial angioplasty. A Gateway 1.5 mm x 15 mm balloon was then prepped and purged with 50% contrast and 50% heparinized saline infusion. This was then advanced inside of an intermediary catheter to the supraclinoid left ICA over a 0.014 inch Softip Synchro micro guidewire. The micro guidewire was then advanced through the occluded left middle cerebral artery followed by the placement of the balloon. An angioplasty was then performed with a micro inflation syringe device via micro tubing inflating the balloon to just over 6 atmospheres achieving a 1.6 mm diameter where a balloon was maintained for approximately a minute and a half. It was then deflated and retrieved slightly proximally with the wire maintained distally. A control arteriogram performed demonstrated now significantly improved caliber and flow through the angioplastied segment with a TICI 2b revascularization. An angiogram performed 3 minutes later demonstrated progressive worsening of the angioplastied segment prompting a second angioplasty again with the balloon being inflated gradually to 6.1 atmospheres achieving a diameter of just over 1.6 mm where it was maintained for approximately a minute. The balloon was then deflated and retrieved  proximally. A control arteriogram performed through the intermediary catheter in the left internal carotid artery demonstrated again significantly improved caliber and flow through the angioplastied segment of the left middle cerebral artery. A TICI  2b revascularization was established. A control arteriogram performed approximately 5 minutes after this again demonstrated complete occlusion of the left middle cerebral artery. At this time it was decided to stop as placement of the stent was not an option as per discussion above. The whole head arteriogram performed demonstrated no change in the previously noted robust collaterals from the anterior cerebral artery distribution. Throughout the procedure, the patient's blood pressure and neurological status remained stable. No angiographic evidence of extravasation or mass-effect or midline shift was noted. A CT of the head performed on the table demonstrated no gross mass effect, midline shift or of intracranial hemorrhage. Stability of the previously noted subdural hematoma was seen. The 8 [place] FlowGate guide catheter and the 8 [place] neurovascular sheath were then retrieved over a J-wire and replaced with an Walls sheath. This in turn was then removed with successful hemostasis with the an 8 [place] Angio-Seal device. Distal pulses remained Dopplerable in the dorsalis pedis, and posterior regions bilaterally. The patient's general anesthesia was then reversed and the patient was extubated. Upon extubation the patient was able to maintain adequate oxygenation. She was able to move to some degree her left-side spontaneously. No significant motion was noted in the right upper or right lower extremity. Her pupils were 2 mm and equal. She was then transferred to the neuro ICU for further workup. IMPRESSION: Status post endovascular revascularization of occluded left middle cerebral artery with 2 passes with the 5 mm x 33 mm Embotrap retrieval device, and 1  pass with the 4 mm x 40 mm retrieval device, achieving a TICI 2b revascularization with subsequent reocclusion. Balloon angioplasty of occluded left middle cerebral artery probably due to underlying severe stenosis due to atherosclerosis with 2 passes again achieving a TICI 2b revascularization with subsequent reocclusion. PLAN: Follow-up outpatient clinic 4 weeks post discharge. Electronically Signed   By: Luanne Bras M.D.   On: 01/06/2019 13:41   Dg Chest Port 1 View  Result Date: 01/10/2019 CLINICAL DATA:  Left middle cerebral artery thrombosis. Labored breathing this morning. EXAM: PORTABLE CHEST 1 VIEW COMPARISON:  01/05/2019 FINDINGS: Stable heart size and appearance of pacemaker. Feeding tube extends into the expected region of the proximal duodenum. Lungs show minimal bibasilar atelectasis. There is no evidence of pulmonary edema, consolidation, pneumothorax, nodule or pleural fluid. IMPRESSION: No acute findings with minimal bibasilar atelectasis present. Electronically Signed   By: Aletta Edouard M.D.   On: 01/10/2019 09:59   Ir Percutaneous Art Thrombectomy/infusion Intracranial Inc Diag Angio  Result Date: 01/08/2019 INDICATION: New onset dysphagia, right-sided weakness and left gaze deviation. 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 EXAM: 1. EMERGENT LARGE VESSEL OCCLUSION THROMBOLYSIS (anterior CIRCULATION) 2. Intracranial balloon angioplasty of the left middle cerebral artery. COMPARISON:  CT angiogram of the head and neck of 01/05/2019. MEDICATIONS: Vancomycin 1 g IV antibiotic was administered within 1 hour of the procedure. ANESTHESIA/SEDATION: General anesthesia. CONTRAST:  Isovue 300 approximately 120 cc. FLUOROSCOPY TIME:  Fluoroscopy Time: 83 minutes 12 seconds (3019 mGy). COMPLICATIONS: None immediate. TECHNIQUE: Following a full explanation of the procedure along with the potential associated complications, an informed witnessed consent was obtained  from the patient's daughter and husband. The risks of intracranial hemorrhage of 10%, worsening neurological deficit, ventilator dependency, death and inability to revascularize were all reviewed in detail with the patient's daughter and husband. The patient was then put under general anesthesia by the Department of Anesthesiology at [facility]. The right groin was  prepped and draped in the usual sterile fashion. Thereafter using modified Seldinger technique, transfemoral access into the right common femoral artery was obtained without difficulty. Over a 0.035 inch guidewire a 5 French Pinnacle sheath was inserted. Through this, and also over a 0.035 inch guidewire a 5 [place] JB 1 catheter was advanced to the aortic arch region and selectively positioned in the left common carotid artery. FINDINGS: The left common carotid arteriogram demonstrates the left external carotid artery and its major branches to be widely patent. The left internal carotid artery at the bulb to the cranial skull base demonstrates wide patency with mild tortuosity proximally. The petrous, cavernous and supraclinoid segments are widely patent. The left middle cerebral artery demonstrates complete angiographic occlusion in the mid M1 segment at the origin of the anterior temporal branch. The left anterior cerebral artery is seen to opacify into the capillary and venous phases. The delayed arterial phase demonstrates retrograde opacification of the distal left MCA distribution from the pericallosal and callosal marginal branches. Retrograde opacification is noted into the M3 regions of the perisylvian branches. PROCEDURE: ENDOVASCULAR REVASCULARIZATION OF OCCLUDED LEFT MIDDLE CEREBRAL ARTERY M1 SEGMENT WITH MECHANICAL THROMBECTOMY, AND ALSO BALLOON ANGIOPLASTY FOR RECURRENT OCCLUSION. The diagnostic JB 1 catheter in the left common carotid artery was exchanged over a 0.035 inch 300 cm Rosen exchange guidewire for an 8 French 55 cm Brite  tip neurovascular sheath using biplane roadmap technique and constant fluoroscopic guidance. Good aspiration was obtained from the side port of the neurovascular sheath. This was then connected to continuous heparinized saline infusion. Over the [employer] guidewire, an 8 [place] 85 cm balloon FlowGate guide catheter which had been prepped with 50% contrast and 50% heparinized saline infusion was advanced and positioned in the mid cervical left ICA. The guidewire was removed. Good aspiration obtained from the hub of the [facility] guide catheter. A gentle control arteriogram performed through [facility] guide catheter in the left internal carotid artery demonstrated no evidence of spasms, dissections or of intraluminal filling defects. No change was seen in the left MCA occlusion. In a coaxial manner and with constant heparinized saline infusion using biplane roadmap technique and constant fluoroscopic guidance, a combination of a 6 French 132 cm Catalyst guide catheter inside of which was an 021 Trevo ProVue microcatheter was advanced over a 0.014 inch Softip Synchro micro guidewire to the distal end of the [facility] guide catheter in the left internal carotid artery. With the micro guidewire leading with a J-tip configuration, the combination was navigated to the supraclinoid left ICA. The occluded left middle cerebral artery was then crossed with the micro guidewire without difficulty into the inferior division M2 M3 region followed by the microcatheter. The guidewire was removed. Good aspiration was obtained from the hub of the microcatheter. This was then connected to continuous heparinized saline infusion. A 33 mm x 5 mm Embotrap retrieval device was then advanced into the distal end of the microcatheter. The proximal and the distal landing zones were then defined. The O ring on the delivery microcatheter was then loosened. With slight forward traction with the right hand on the delivery micro guidewire, with  the left hand the delivery microcatheter was retrieved unsheathing the distal and then the proximal portion of the retrieval device. A gentle control arteriogram performed through Catalyst guide catheter in the supraclinoid left ICA demonstrated a revascularization. With proximal flow arrest in the left internal carotid artery by inflating the balloon of the [facility] guide catheter, the combination of the retrieval  device, the microcatheter and the 6 [place] Catalyst guide catheter were retrieved and removed as aspiration was continued as the balloon was deflated in the left internal carotid artery. Aspiration was performed with a 60 mL syringe at the hub of the [facility] guide catheter, and with a Prenumbra aspiration device the hub of the 6 [place] Catalyst guide catheter. Free back bleed of blood was noted at the hub of the [facility] guide catheter. A gentle control arteriogram performed through the [facility] guide catheter in the left internal carotid artery demonstrated no change in the occluded left middle cerebral artery. No evidence of clots or debris was found in the retrieval device or the aspirate. Two more attempts at mechanical thrombectomy were then made. A second attempt was again using the combination as described above. Again after having established safe position of tip of the microcatheter and placement of the 5 mm x 33 mm Embotrap retrieval device, with proximal flow arrest in the left internal carotid, and aspiration at the hub of the [facility] guide catheter with a 60 mL syringe, and with a Penumbra aspiration catheter at the hub of the 6 [place] Catalyst guide catheter, again with proximal flow arrest, the combination was retrieved and removed without evidence of clots in the aspirate or the retrieval device. A control arteriogram performed following this continued to demonstrate occluded left middle cerebral artery. A third attempt was made with using a combination of a 7 [place] Catalyst guide  catheter inside of which was an 021 Trevo ProVue microcatheter again advanced over a 0.014 inch Softip Synchro micro guidewire to the distal end of the supraclinoid left ICA. Again access was obtained with the micro guidewire into M2 M3 region of the dominant inferior division followed by the microcatheter. After having established safe position of the tip of the microcatheter, and connected to heparinized saline infusion, a 4 mm x 40 mm Solitaire FR retrieval device was then advanced and deployed in a predetermined position. Again a control arteriogram performed through the Catalyst guide catheter in the supraclinoid left ICA continued to demonstrate a TICI 2b revascularization while the device was opened. Thereafter with proximal flow arrest, the combination of the retrieval device, the microcatheter and the 7 [place] Catalyst guide catheter was retrieved and removed as aspiration was applied with a 60 mL syringe at the hub of the [facility] guide catheter, and with a Penumbra device at the hub of the 7 [place] Catalyst guide catheter the combination was retrieved and removed. Back bleed was allowed after reversal of aspiration. At this time there were a few flecks of whitish material noted in the aspirate. A control arteriogram performed through the [facility] guide catheter in the left internal carotid artery demonstrated continued complete occlusion of the left middle cerebral artery unchanged. It was felt that the underlying pathology was probably arteriosclerotic disease with significant stenosis. Placement of a stent across the tight occlusion of the left middle cerebral artery was felt to be potentially riskier in terms of post reperfusion hemorrhage and also with the patient having to be given dual antiplatelets. With the patient already being on warfarin, and also a history of subdural hematoma overlying the cerebral convexity on the left side, it was felt to proceed with intracranial angioplasty. A [place] 1.5  mm x 15 mm balloon was then prepped and purged with 50% contrast and 50% heparinized saline infusion. This was then advanced inside of an intermediary catheter to the supraclinoid left ICA over a 0.014 inch Softip Synchro micro guidewire.  The micro guidewire was then advanced through the occluded left middle cerebral artery followed by the placement of the balloon. An angioplasty was then performed with a micro inflation syringe device via micro tubing inflating the balloon to just over 6 atmospheres achieving a 1.6 mm diameter where a balloon was maintained for approximately a minute and a half. It was then deflated and retrieved slightly proximally with the wire maintained distally. A control arteriogram performed demonstrated now significantly improved caliber and flow through the angioplastied segment with a TICI 2b revascularization. An angiogram performed 3 minutes later demonstrated progressive worsening of the angioplastied segment prompting a second angioplasty again with the balloon being inflated gradually to 6.1 atmospheres achieving a diameter of just over 1.6 mm where it was maintained for approximately a minute. The balloon was then deflated and retrieved proximally. A control arteriogram performed through the intermediary catheter in the left internal carotid artery demonstrated again significantly improved caliber and flow through the angioplastied segment of the left middle cerebral artery. A TICI 2b revascularization was established. A control arteriogram performed approximately 5 minutes after this again demonstrated complete occlusion of the left middle cerebral artery. At this time it was decided to stop as placement of the stent was not an option as per discussion above. The whole head arteriogram performed demonstrated no change in the previously noted robust collaterals from the anterior cerebral artery distribution. Throughout the procedure, the patient's blood pressure and neurological  status remained stable. No angiographic evidence of extravasation or mass-effect or midline shift was noted. A CT of the head performed on the table demonstrated no gross mass effect, midline shift or of intracranial hemorrhage. Stability of the previously noted subdural hematoma was seen. The 8 [place] FlowGate guide catheter and the 8 French neurovascular sheath were then retrieved over a J-wire and replaced with an [place] sheath. This in turn was then removed with successful hemostasis with the an 8 [place] Angio-Seal device. Distal pulses remained Dopplerable in the dorsalis pedis, and posterior regions bilaterally. The patient's general anesthesia was then reversed and the patient was extubated. Upon extubation the patient was able to maintain adequate oxygenation. She was able to move to some degree her left-side spontaneously. No significant motion was noted in the right upper or right lower extremity. Her pupils were 2 mm and equal. She was then transferred to the neuro ICU for further workup. IMPRESSION: Status post endovascular revascularization of occluded left middle cerebral artery with 2 passes with the 5 mm x 33 mm Embotrap retrieval device, and 1 pass with the 4 mm x 40 mm retrieval device, achieving a TICI 2b revascularization with subsequent reocclusion. Balloon angioplasty of occluded left middle cerebral artery probably due to underlying severe stenosis due to atherosclerosis with 2 passes again achieving a TICI 2b revascularization with subsequent reocclusion. PLAN: Follow-up outpatient clinic 4 weeks post discharge. Electronically Signed   By: Luanne Bras M.D.   On: 01/06/2019 13:41       Subjective: Unable to asses due to mentation.  No new respiratory distress, tachycardia, hemodynamic instability per nursing.  No change in unresponsiveness  Discharge Exam: Vitals:   01/14/19 2025 01/15/19 0836  BP: (!) 151/70 133/72  Pulse: 90 89  Resp: 20 16  Temp: 98.2 F  (36.8 C) 98.6 F (37 C)  SpO2: 99% 100%   Vitals:   01/14/19 0325 01/14/19 0831 01/14/19 2025 01/15/19 0836  BP: 129/61 (!) 143/84 (!) 151/70 133/72  Pulse: 89 86 90 89  Resp: _0 Temp: 98.6 F (37 C) 98.7 F (37.1 C) 98.2 F (36.8 C) 98.6 F (37 C)  TempSrc: Oral Oral Oral Axillary  SpO2: 98% 99% 99% 100%  Weight: 71.6 kg     Height:        General: Pt opens eyes to touch/voice, seems to make eye contact, follows no commands Cardiovascular: RRR, nl S1-S2, no murmurs appreciated.   No LE edema.   Respiratory: Normal respiratory rate and rhythm.  CTAB without rales or wheezes. Abdominal: Abdomen soft and without grimace to palpation.  No distension or HSM.   Neuro/Psych: No response to commands.  Limp right side.  Left side can hold against gravity, does not follow commands.   The results of significant diagnostics from this hospitalization (including imaging, microbiology, ancillary and laboratory) are listed below for reference.     Microbiology: Recent Results (from the past 240 hour(s))  MRSA PCR Screening     Status: None   Collection Time: 01/05/19  1:03 PM  Result Value Ref Range Status   MRSA by PCR NEGATIVE NEGATIVE Final    Comment:        The GeneXpert MRSA Assay (FDA approved for NASAL specimens only), is one component of a comprehensive MRSA colonization surveillance program. It is not intended to diagnose MRSA infection nor to guide or monitor treatment for MRSA infections. Performed at [facility], [place] [address], [place], [place] [address]      Labs: BNP (last 3 results) No results for input(s): BNP in the last 8760 hours. Basic Metabolic Panel: Recent Labs  Lab 01/09/19 0407 01/10/19 1001 01/11/19 0517 01/12/19 0437 01/13/19 0502  NA 142 144 144 148* 146*  K 3.3* 3.7 3.5 3.8 3.3*  CL 111 110 108 109 106  CO2 _1 35*  GLUCOSE 260* 267* 267* 202* 127*  BUN 16 20 24* 30* 27*  CREATININE 0.66 0.72 0.76 0.72 0.79   CALCIUM 8.7* 8.9 8.8* 8.5* 8.3*   Liver Function Tests: No results for input(s): AST, ALT, ALKPHOS, BILITOT, PROT, ALBUMIN in the last 168 hours. No results for input(s): LIPASE, AMYLASE in the last 168 hours. No results for input(s): AMMONIA in the last 168 hours. CBC: Recent Labs  Lab 01/09/19 0407 01/10/19 1001 01/11/19 0804 01/12/19 0821 01/13/19 0502  WBC 8.8 11.1* 10.3 9.0 7.0  HGB 8.5* 9.5* 9.7* 9.8* 8.9*  HCT 27.3* 30.7* 31.0* 31.8* 28.8*  MCV 96.8 97.8 98.4 100.0 100.7*  PLT 242 265 252 236 202   Cardiac Enzymes: No results for input(s): CKTOTAL, CKMB, CKMBINDEX, TROPONINI in the last 168 hours. BNP: Invalid input(s): POCBNP CBG: Recent Labs  Lab 01/13/19 1950 01/13/19 2330 01/14/19 0327 01/14/19 0826 01/14/19 1120  GLUCAP 137* 201* 164* 102* 171*   D-Dimer No results for input(s): DDIMER in the last 72 hours. Hgb A1c No results for input(s): HGBA1C in the last 72 hours. Lipid Profile No results for input(s): CHOL, HDL, LDLCALC, TRIG, CHOLHDL, LDLDIRECT in the last 72 hours. Thyroid function studies No results for input(s): TSH, T4TOTAL, T3FREE, THYROIDAB in the last 72 hours.  Invalid input(s): FREET3 Anemia work up No results for input(s): VITAMINB12, FOLATE, FERRITIN, TIBC, IRON, RETICCTPCT in the last 72 hours. Urinalysis    Component Value Date/Time   COLORURINE YELLOW 01/10/2019 1654   APPEARANCEUR CLEAR 01/10/2019 1654   APPEARANCEUR Clear 08/15/2014 2334   LABSPEC 1.032 (H) 01/10/2019 1654   LABSPEC 1.002 08/15/2014 2334  PHURINE 5.0 01/10/2019 1654   GLUCOSEU >=500 (A) 01/10/2019 1654   GLUCOSEU >=500 08/15/2014 2334   HGBUR SMALL (A) 01/10/2019 1654   BILIRUBINUR NEGATIVE 01/10/2019 1654   BILIRUBINUR Negative 08/15/2014 2334   KETONESUR NEGATIVE 01/10/2019 1654   PROTEINUR 100 (A) 01/10/2019 1654   NITRITE NEGATIVE 01/10/2019 1654   LEUKOCYTESUR TRACE (A) 01/10/2019 1654   LEUKOCYTESUR Negative 08/15/2014 2334   Sepsis  Labs Invalid input(s): PROCALCITONIN,  WBC,  LACTICIDVEN Microbiology Recent Results (from the past 240 hour(s))  MRSA PCR Screening     Status: None   Collection Time: 01/05/19  1:03 PM  Result Value Ref Range Status   MRSA by PCR NEGATIVE NEGATIVE Final    Comment:        The GeneXpert MRSA Assay (FDA approved for NASAL specimens only), is one component of a comprehensive MRSA colonization surveillance program. It is not intended to diagnose MRSA infection nor to guide or monitor treatment for MRSA infections. Performed at [facility], [place] [address], [place], [place] [address]      Time coordinating discharge:25 minutes The [place] controlled substances registry was reviewed for this patient prior to filling the <5 days supply controlled substances script.      SIGNED:   Edwin Dada, MD  [facility] 01/15/2019, 12:36 PM

## 2019-01-15 NOTE — Progress Notes (Signed)
Discharge to: [facility] Anticipated discharge date: 01/15/19 Family notified: Yes, daughter, by phone Transportation by: [facility]  Report #: Cell (if working) [phone], but don't leave a message if it's not working If cell isn't working, call the actual building: [phone] OR [phone]  Kyle signing off.  Laveda Abbe LCSW [phone]

## 2019-02-14 DEATH — deceased

## 2019-07-25 IMAGING — DX DG CHEST 2V
2 series · 2 of 2 positions shown · non-contrast
Comparison: 01/10/2019 and 01/05/2019 chest x-ray

CLINICAL DATA: 82-year-old female with hypoxia. History of stroke.
Subsequent encounter.

EXAM:
CHEST - 2 VIEW

[chest lat]
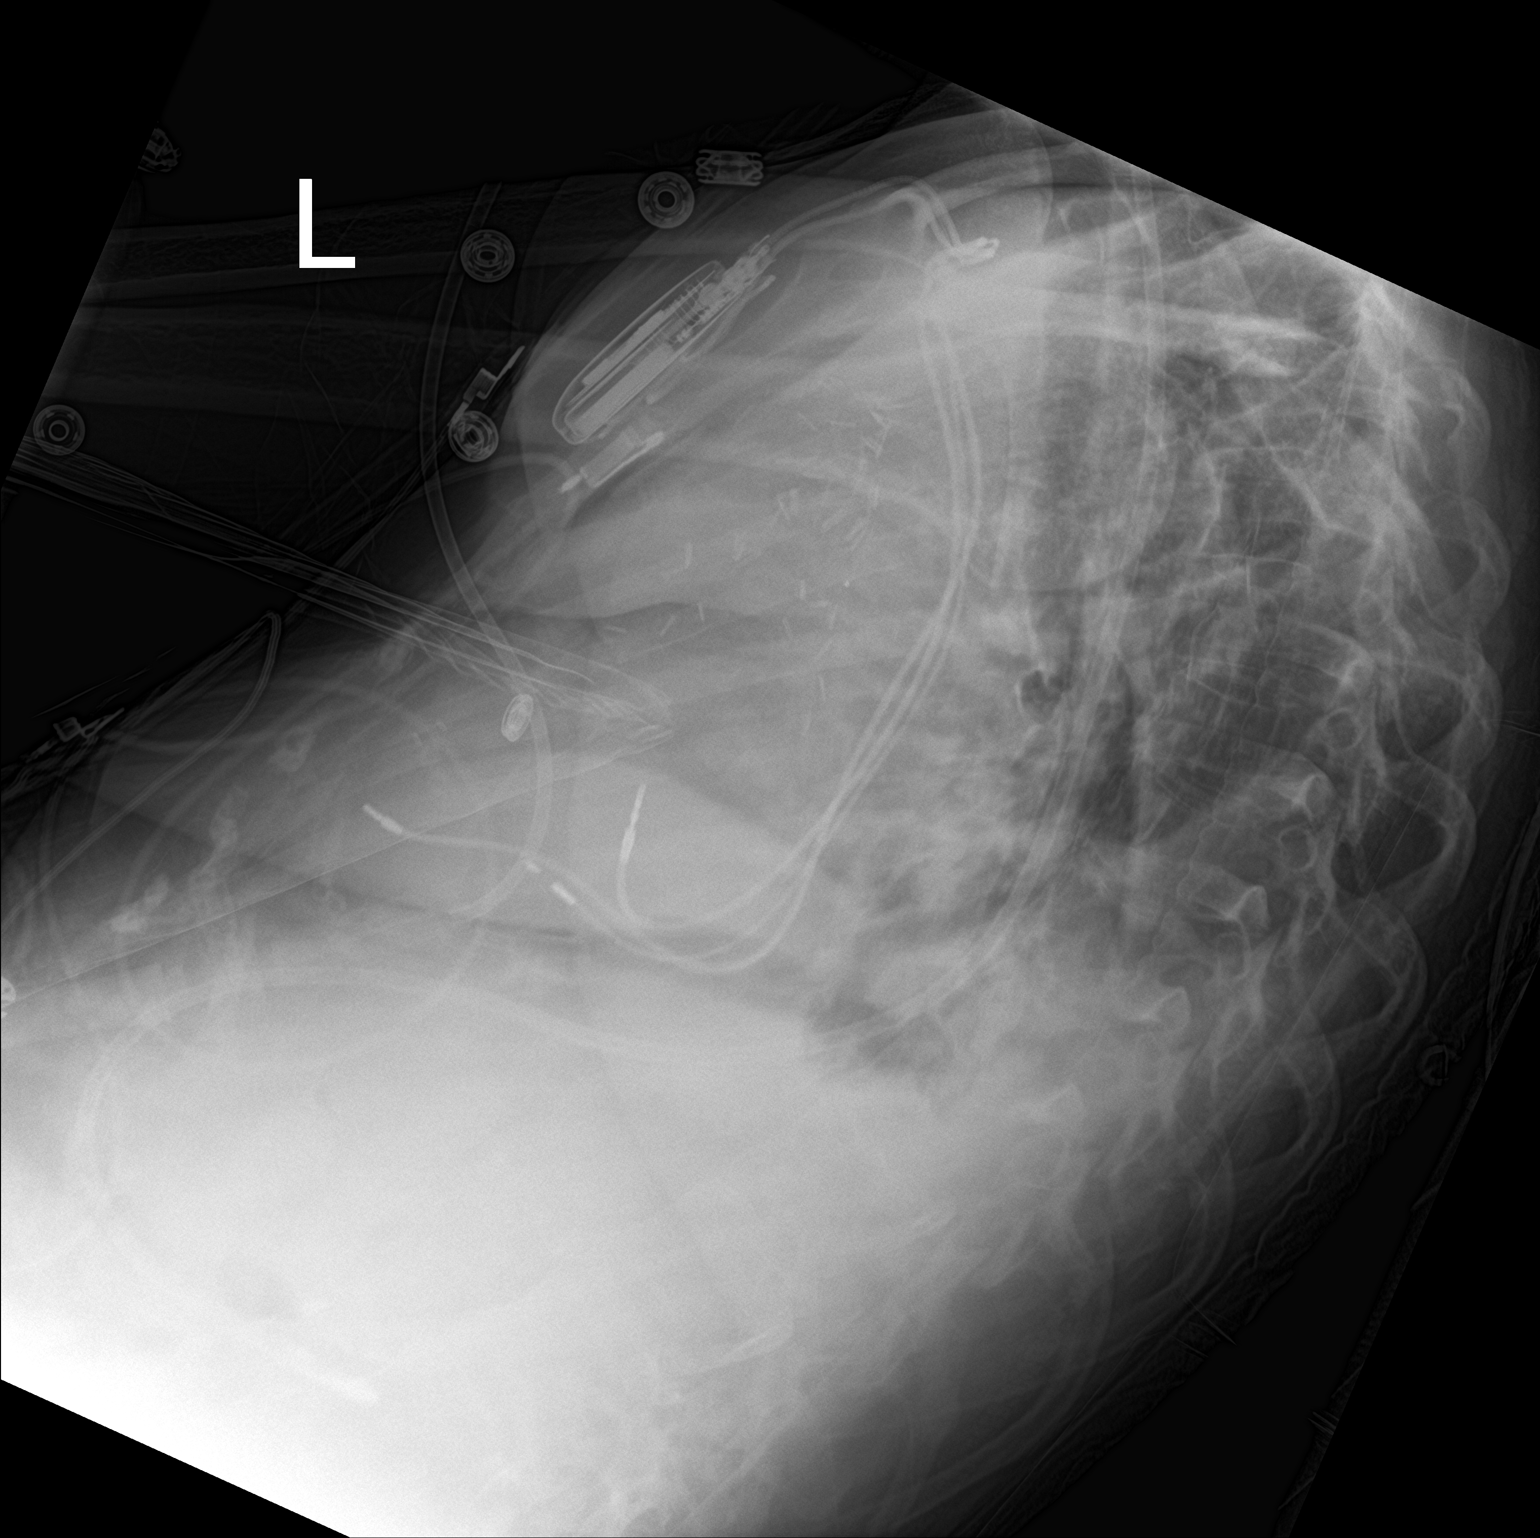

[chest ap]
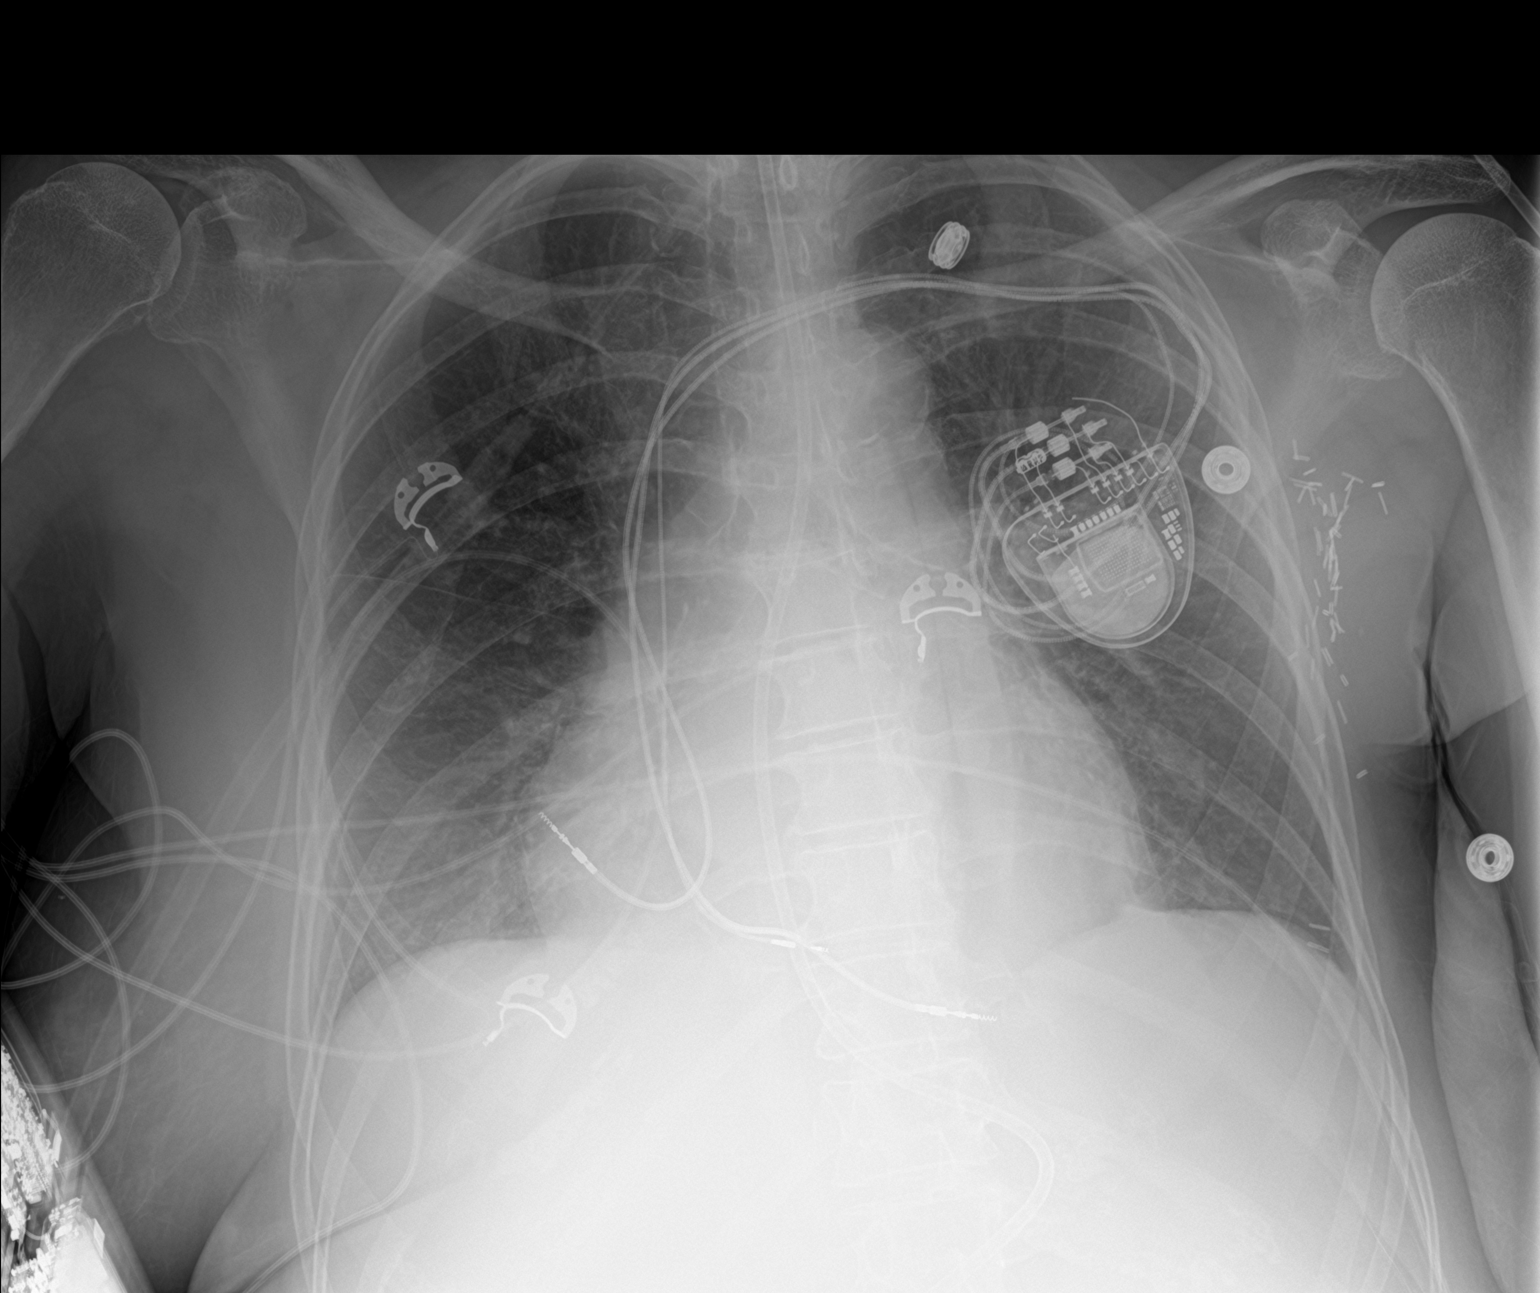

[2 of 2 positions shown; findings below may reference images not displayed]

FINDINGS: As best appreciated on lateral view, increase consolidation
involving the lung bases greater on the right. This may represent
pleural fluid with basilar atelectasis although lower lobe
infiltrate, particularly on the right, can not be excluded.

Mild pulmonary vascular congestion/mild pulmonary edema.

Cardiomegaly with sequential pacemaker in place.

Post left breast surgery and axillary lymph node dissection.

Feeding tube in place, tip not imaged on frontal view.

Calcified aorta.
IMPRESSION: 1. Increased consolidation involving lung bases greater on right.
This may represent pleural fluid with atelectasis although lower
lobe infiltrate, particularly on the right, could not be excluded in
proper clinical setting.
2. Pulmonary vascular congestion/mild pulmonary edema.
3. Cardiomegaly with pacemaker in place
4.  Aortic Atherosclerosis ([id number]).

## 2019-07-25 IMAGING — CT CT HEAD W/O CM
4 series · 15 of 47 positions shown, 17 images · non-contrast
Comparison: 01/07/2019

CLINICAL DATA: Stroke follow-up

EXAM:
CT HEAD WITHOUT CONTRAST
TECHNIQUE: Contiguous axial images were obtained from the base of the skull
through the vertex without intravenous contrast.

[Series 3: head without · axial · non-contrast · 0.45mm/px · z∈[-155,-35]mm · 7 of 32 slices shown, 9 images]
[im 4/32  brain]
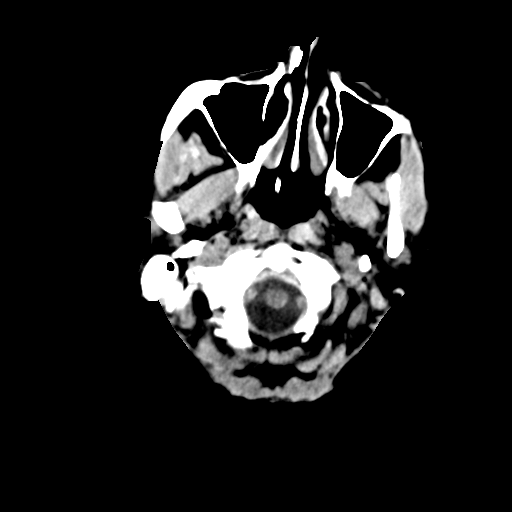
[im 4/32  bone]
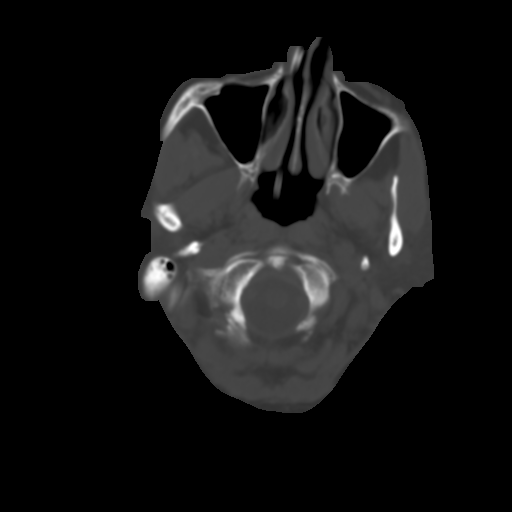
[im 8/32  brain]
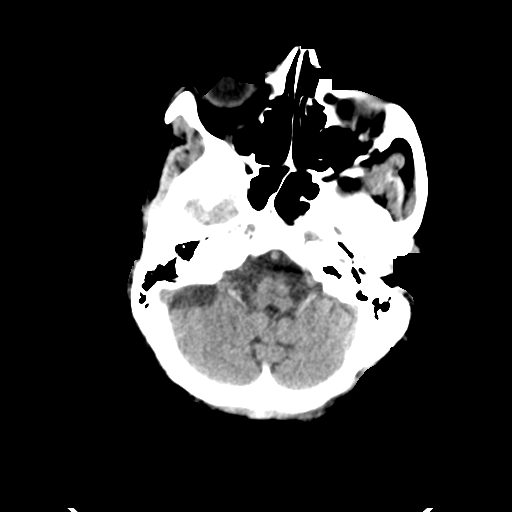
[im 12/32  brain]
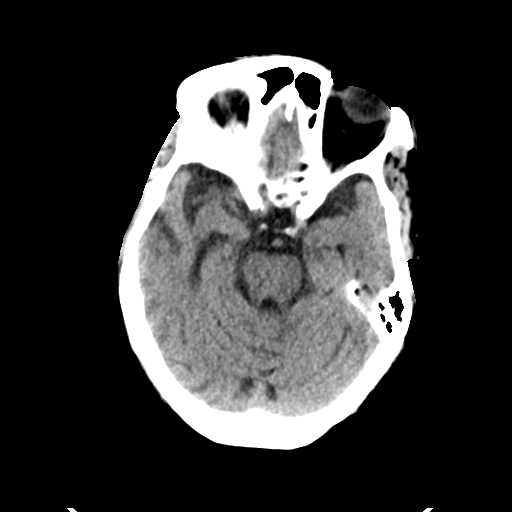
[im 16/32  brain]
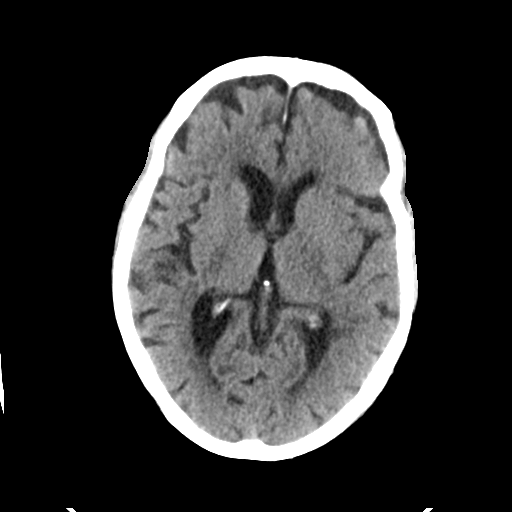
[im 20/32  brain]
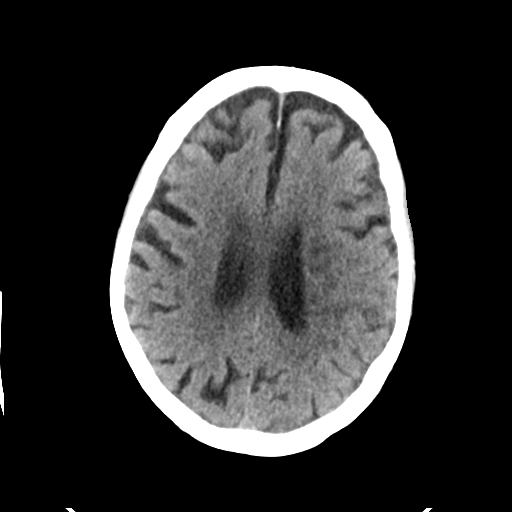
[im 20/32  bone]
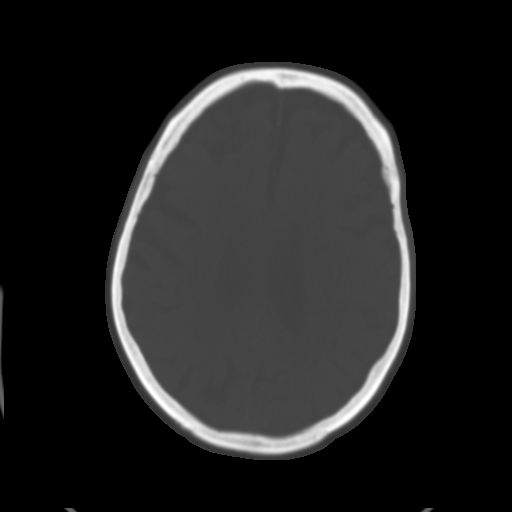
[im 24/32  brain]
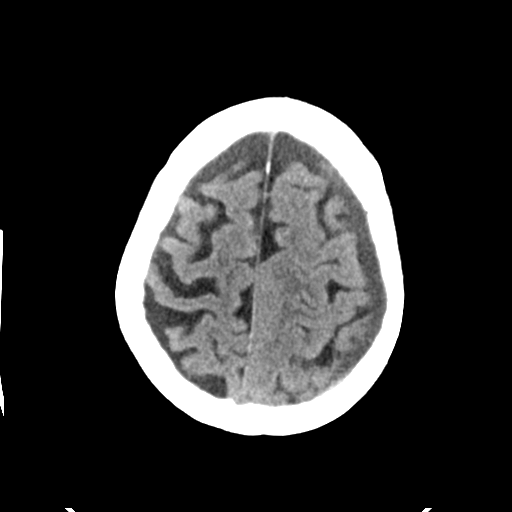
[im 28/32  brain]
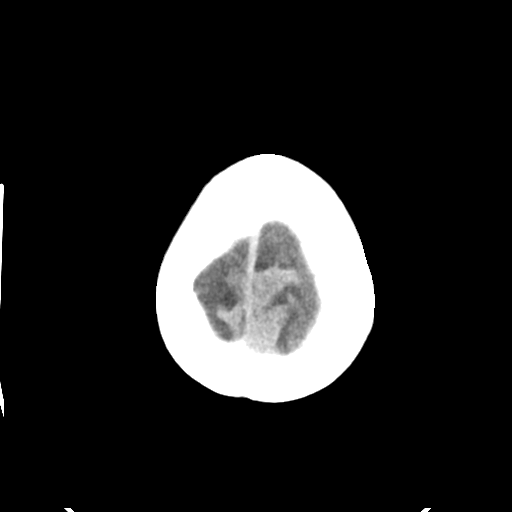

[Series 4: head bone · axial · 0.45mm/px · z∈[-156,-140]mm · 2 of 80 slices shown]
[im 8/80  bone]
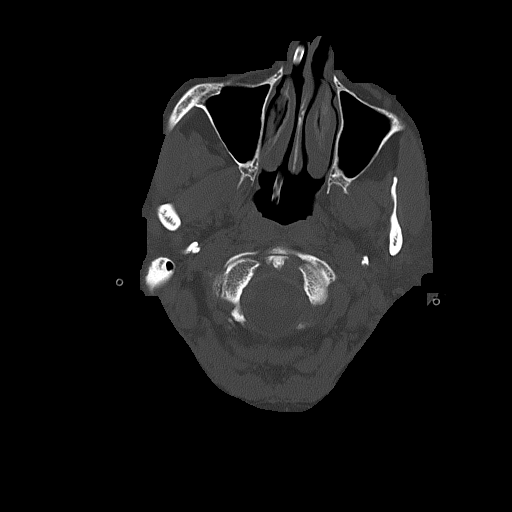
[im 16/80  bone]
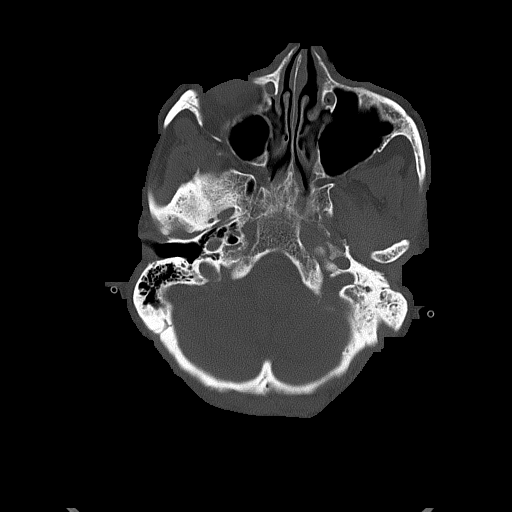

[Series 5: head without cor · coronal · non-contrast · 0.31mm/px · 3 of 70 slices shown]
[im 24/70  brain]
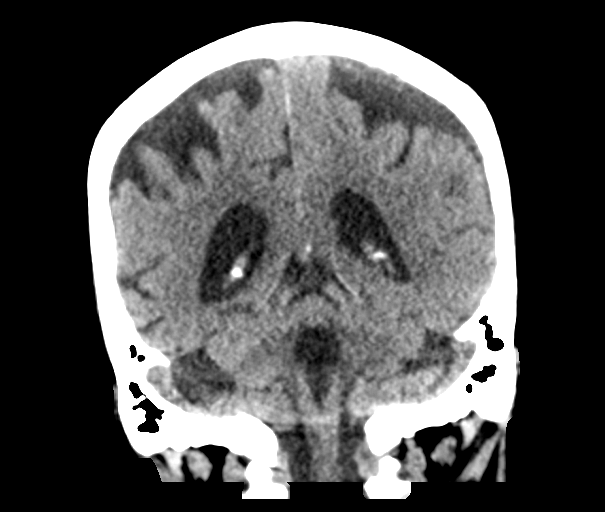
[im 31/70  brain]
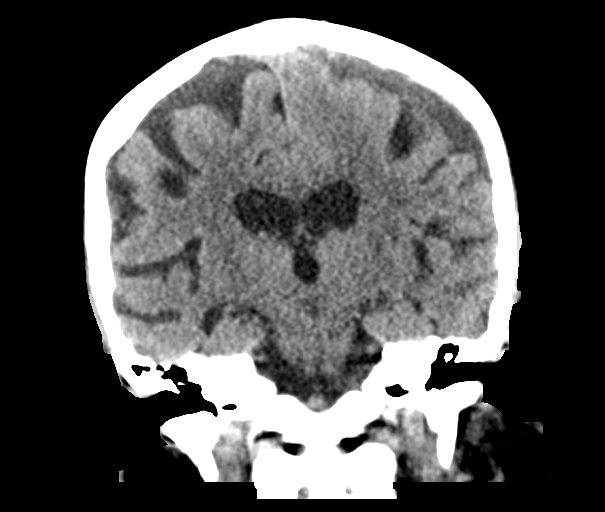
[im 39/70  brain]
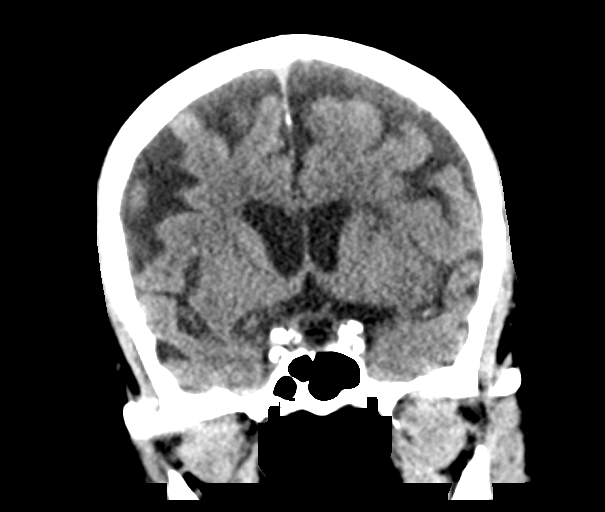

[Series 6: head without sag · sagittal · non-contrast · 0.31mm/px · 3 of 67 slices shown]
[im 23/67  brain]
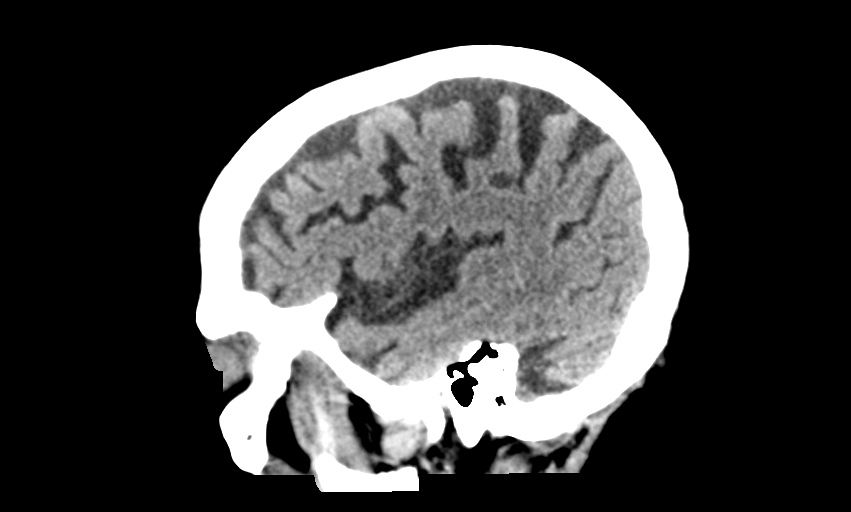
[im 34/67  brain]
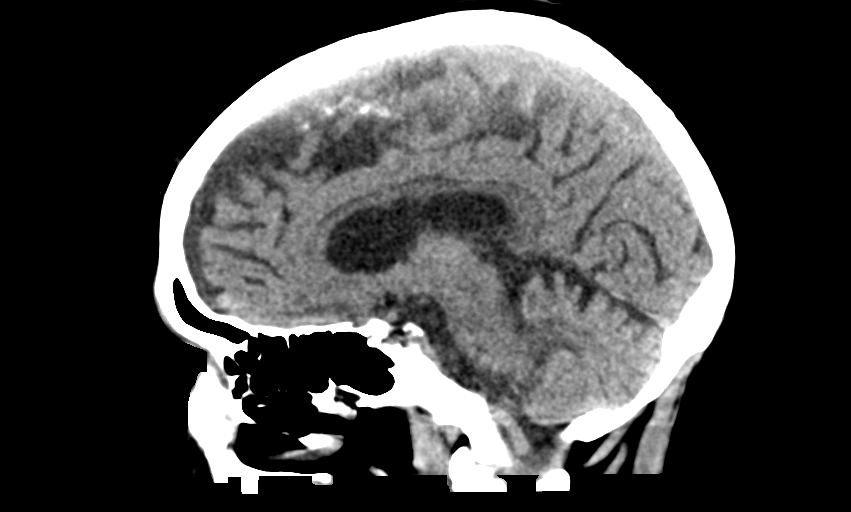
[im 45/67  brain]
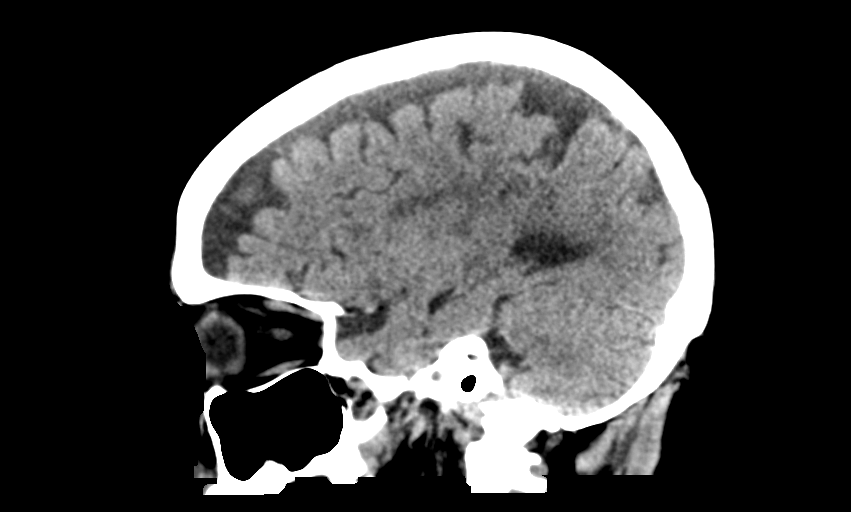

[15 of 47 positions shown; findings below may reference images not displayed]

FINDINGS: Brain: Low-density left subdural hematoma is unchanged in size and
appearance. There is periventricular hypoattenuation compatible with
chronic microvascular disease. There is progressive hypoattenuation
within the left corona radiata. No intraparenchymal hemorrhage. No
midline shift or other mass effect.

Vascular: No abnormal hyperdensity of the major intracranial
arteries or dural venous sinuses. No intracranial atherosclerosis.

Skull: The visualized skull base, calvarium and extracranial soft
tissues are normal.

Sinuses/Orbits: No fluid levels or advanced mucosal thickening of
the visualized paranasal sinuses. No mastoid or middle ear effusion.
The orbits are normal.
IMPRESSION: 1. Unchanged size and appearance of low-density left subdural
hematoma.
2. Worsening hypoattenuation within the left corona radiata,
consistent with evolving subacute infarct.
3. No midline shift or other mass effect.
